# Patient Record
Sex: Male | Born: 1955
Health system: Southern US, Community
[De-identification: ages and names within clinical notes are randomized; demographics above are authoritative.]

## PROBLEM LIST (undated history)

## (undated) DIAGNOSIS — I7300 Raynaud's syndrome without gangrene: Secondary | ICD-10-CM

## (undated) DIAGNOSIS — E119 Type 2 diabetes mellitus without complications: Secondary | ICD-10-CM

## (undated) DIAGNOSIS — E785 Hyperlipidemia, unspecified: Secondary | ICD-10-CM

## (undated) HISTORY — DX: Hyperlipidemia, unspecified: E78.5

## (undated) HISTORY — DX: Raynaud's syndrome without gangrene: I73.00

## (undated) HISTORY — PX: COLONOSCOPY: SHX174

## (undated) HISTORY — DX: Type 2 diabetes mellitus without complications: E11.9

## (undated) HISTORY — DX: Gilbert syndrome: E80.4

## (undated) HISTORY — PX: TONSILLECTOMY AND ADENOIDECTOMY: SUR1326

## (undated) HISTORY — PX: SHOULDER SURGERY: SHX246

---

## 2001-06-16 ENCOUNTER — Encounter: Payer: Self-pay | Admitting: Internal Medicine

## 2001-06-16 ENCOUNTER — Ambulatory Visit (HOSPITAL_COMMUNITY): Admission: RE | Admit: 2001-06-16 | Discharge: 2001-06-16 | Payer: Self-pay | Admitting: Internal Medicine

## 2001-07-19 ENCOUNTER — Encounter: Admission: RE | Admit: 2001-07-19 | Discharge: 2001-10-17 | Payer: Self-pay | Admitting: Internal Medicine

## 2003-10-25 ENCOUNTER — Ambulatory Visit (HOSPITAL_COMMUNITY): Admission: RE | Admit: 2003-10-25 | Discharge: 2003-10-25 | Payer: Self-pay | Admitting: Gastroenterology

## 2004-08-10 ENCOUNTER — Ambulatory Visit: Payer: Self-pay | Admitting: Internal Medicine

## 2004-08-17 ENCOUNTER — Ambulatory Visit: Payer: Self-pay | Admitting: Internal Medicine

## 2004-11-09 ENCOUNTER — Ambulatory Visit: Payer: Self-pay | Admitting: Internal Medicine

## 2005-07-08 ENCOUNTER — Ambulatory Visit: Payer: Self-pay | Admitting: Internal Medicine

## 2005-10-07 ENCOUNTER — Ambulatory Visit: Payer: Self-pay | Admitting: Internal Medicine

## 2006-02-03 ENCOUNTER — Ambulatory Visit: Payer: Self-pay | Admitting: Internal Medicine

## 2006-06-28 HISTORY — DX: Gilbert syndrome: E80.4

## 2006-07-17 ENCOUNTER — Encounter (INDEPENDENT_AMBULATORY_CARE_PROVIDER_SITE_OTHER): Payer: Self-pay | Admitting: *Deleted

## 2006-07-17 LAB — CONVERTED CEMR LAB

## 2006-08-22 ENCOUNTER — Ambulatory Visit: Payer: Self-pay | Admitting: Internal Medicine

## 2006-08-22 LAB — CONVERTED CEMR LAB
ALT: 21 units/L (ref 0–40)
AST: 29 units/L (ref 0–37)
Albumin: 4.3 g/dL (ref 3.5–5.2)
Alkaline Phosphatase: 54 units/L (ref 39–117)
BUN: 17 mg/dL (ref 6–23)
Basophils Absolute: 0 10*3/uL (ref 0.0–0.1)
Basophils Relative: 1 % (ref 0.0–1.0)
Bilirubin, Direct: 0.2 mg/dL (ref 0.0–0.3)
CO2: 30 meq/L (ref 19–32)
Calcium: 9.7 mg/dL (ref 8.4–10.5)
Chloride: 104 meq/L (ref 96–112)
Cholesterol: 167 mg/dL (ref 0–200)
Creatinine, Ser: 1.1 mg/dL (ref 0.4–1.5)
Creatinine,U: 199.4 mg/dL
Eosinophils Absolute: 0 10*3/uL (ref 0.0–0.6)
Eosinophils Relative: 0.7 % (ref 0.0–5.0)
GFR calc Af Amer: 91 mL/min
GFR calc non Af Amer: 75 mL/min
Glucose, Bld: 141 mg/dL — ABNORMAL HIGH (ref 70–99)
HCT: 42.4 % (ref 39.0–52.0)
HDL: 53.7 mg/dL (ref >39.0)
Hemoglobin: 14.4 g/dL (ref 13.0–17.0)
Hgb A1c MFr Bld: 6.4 % — ABNORMAL HIGH (ref 4.6–6.0)
LDL Cholesterol: 96 mg/dL (ref 0–99)
Lymphocytes Relative: 37 % (ref 12.0–46.0)
MCHC: 34 g/dL (ref 30.0–36.0)
MCV: 94.5 fL (ref 78.0–100.0)
Microalb Creat Ratio: 3 mg/g (ref 0.0–30.0)
Microalb, Ur: 0.6 mg/dL (ref 0.0–1.9)
Monocytes Absolute: 0.3 10*3/uL (ref 0.2–0.7)
Monocytes Relative: 7.6 % (ref 3.0–11.0)
Neutro Abs: 2 10*3/uL (ref 1.4–7.7)
Neutrophils Relative %: 53.7 % (ref 43.0–77.0)
Platelets: 167 10*3/uL (ref 150–400)
Potassium: 4.5 meq/L (ref 3.5–5.1)
RBC: 4.49 M/uL (ref 4.22–5.81)
RDW: 12.1 % (ref 11.5–14.6)
Sodium: 142 meq/L (ref 135–145)
TSH: 1.38 microintl units/mL (ref 0.35–5.50)
Total Bilirubin: 1.4 mg/dL — ABNORMAL HIGH (ref 0.3–1.2)
Total CHOL/HDL Ratio: 3.1
Total Protein: 6.8 g/dL (ref 6.0–8.3)
Triglycerides: 88 mg/dL (ref 0–149)
VLDL: 18 mg/dL (ref 0–40)
WBC: 3.6 10*3/uL — ABNORMAL LOW (ref 4.5–10.5)

## 2007-02-16 ENCOUNTER — Ambulatory Visit: Payer: Self-pay | Admitting: Internal Medicine

## 2007-02-25 LAB — CONVERTED CEMR LAB
Cholesterol: 125 mg/dL
HDL: 47.3 mg/dL
Hgb A1c MFr Bld: 5.9 %
LDL Cholesterol: 69 mg/dL
Total CHOL/HDL Ratio: 2.6
Triglycerides: 46 mg/dL
VLDL: 9 mg/dL

## 2007-02-28 ENCOUNTER — Encounter (INDEPENDENT_AMBULATORY_CARE_PROVIDER_SITE_OTHER): Payer: Self-pay | Admitting: *Deleted

## 2007-05-08 ENCOUNTER — Telehealth (INDEPENDENT_AMBULATORY_CARE_PROVIDER_SITE_OTHER): Payer: Self-pay | Admitting: *Deleted

## 2007-05-11 DIAGNOSIS — Z9089 Acquired absence of other organs: Secondary | ICD-10-CM

## 2007-05-11 DIAGNOSIS — M109 Gout, unspecified: Secondary | ICD-10-CM | POA: Insufficient documentation

## 2007-05-11 DIAGNOSIS — I7300 Raynaud's syndrome without gangrene: Secondary | ICD-10-CM | POA: Insufficient documentation

## 2007-06-09 ENCOUNTER — Telehealth (INDEPENDENT_AMBULATORY_CARE_PROVIDER_SITE_OTHER): Payer: Self-pay | Admitting: *Deleted

## 2007-06-20 ENCOUNTER — Encounter (INDEPENDENT_AMBULATORY_CARE_PROVIDER_SITE_OTHER): Payer: Self-pay | Admitting: *Deleted

## 2007-08-21 ENCOUNTER — Ambulatory Visit: Payer: Self-pay | Admitting: Internal Medicine

## 2007-08-29 ENCOUNTER — Encounter (INDEPENDENT_AMBULATORY_CARE_PROVIDER_SITE_OTHER): Payer: Self-pay | Admitting: *Deleted

## 2007-08-29 LAB — CONVERTED CEMR LAB
ALT: 16 units/L (ref 0–53)
AST: 20 units/L (ref 0–37)
Albumin: 3.7 g/dL (ref 3.5–5.2)
Alkaline Phosphatase: 55 units/L (ref 39–117)
BUN: 16 mg/dL (ref 6–23)
Basophils Absolute: 0 10*3/uL (ref 0.0–0.1)
Basophils Relative: 0.3 % (ref 0.0–1.0)
Bilirubin, Direct: 0.1 mg/dL (ref 0.0–0.3)
CO2: 31 meq/L (ref 19–32)
Calcium: 9.2 mg/dL (ref 8.4–10.5)
Chloride: 104 meq/L (ref 96–112)
Cholesterol: 112 mg/dL (ref 0–200)
Creatinine, Ser: 1.5 mg/dL (ref 0.4–1.5)
Eosinophils Absolute: 0 10*3/uL (ref 0.0–0.6)
Eosinophils Relative: 0.8 % (ref 0.0–5.0)
GFR calc Af Amer: 63 mL/min
GFR calc non Af Amer: 52 mL/min
Glucose, Bld: 120 mg/dL — ABNORMAL HIGH (ref 70–99)
HCT: 40.5 % (ref 39.0–52.0)
HDL: 38.6 mg/dL — ABNORMAL LOW (ref >39.0)
Hemoglobin: 13.5 g/dL (ref 13.0–17.0)
Hgb A1c MFr Bld: 5.9 % (ref 4.6–6.0)
LDL Cholesterol: 65 mg/dL (ref 0–99)
Lymphocytes Relative: 31.6 % (ref 12.0–46.0)
MCHC: 33.4 g/dL (ref 30.0–36.0)
MCV: 93.9 fL (ref 78.0–100.0)
Monocytes Absolute: 0.3 10*3/uL (ref 0.2–0.7)
Monocytes Relative: 8.1 % (ref 3.0–11.0)
Neutro Abs: 2.2 10*3/uL (ref 1.4–7.7)
Neutrophils Relative %: 59.2 % (ref 43.0–77.0)
Platelets: 141 10*3/uL — ABNORMAL LOW (ref 150–400)
Potassium: 5.2 meq/L — ABNORMAL HIGH (ref 3.5–5.1)
RBC: 4.31 M/uL (ref 4.22–5.81)
RDW: 11.7 % (ref 11.5–14.6)
Sodium: 140 meq/L (ref 135–145)
TSH: 1.02 microintl units/mL (ref 0.35–5.50)
Total Bilirubin: 0.6 mg/dL (ref 0.3–1.2)
Total CHOL/HDL Ratio: 2.9
Total Protein: 6 g/dL (ref 6.0–8.3)
Triglycerides: 40 mg/dL (ref 0–149)
VLDL: 8 mg/dL (ref 0–40)
WBC: 3.7 10*3/uL — ABNORMAL LOW (ref 4.5–10.5)

## 2007-09-08 ENCOUNTER — Telehealth (INDEPENDENT_AMBULATORY_CARE_PROVIDER_SITE_OTHER): Payer: Self-pay | Admitting: *Deleted

## 2007-09-21 ENCOUNTER — Ambulatory Visit: Payer: Self-pay | Admitting: Internal Medicine

## 2007-09-21 DIAGNOSIS — E782 Mixed hyperlipidemia: Secondary | ICD-10-CM

## 2007-09-21 DIAGNOSIS — E7849 Other hyperlipidemia: Secondary | ICD-10-CM | POA: Insufficient documentation

## 2007-09-21 DIAGNOSIS — E1165 Type 2 diabetes mellitus with hyperglycemia: Secondary | ICD-10-CM

## 2007-09-21 HISTORY — DX: Other hyperlipidemia: E78.49

## 2008-03-21 ENCOUNTER — Ambulatory Visit: Payer: Self-pay | Admitting: Internal Medicine

## 2008-03-24 LAB — CONVERTED CEMR LAB
Creatinine,U: 164.4 mg/dL
Hgb A1c MFr Bld: 6 % (ref 4.6–6.0)
Microalb Creat Ratio: 29.8 mg/g (ref 0.0–30.0)
Microalb, Ur: 4.9 mg/dL — ABNORMAL HIGH (ref 0.0–1.9)

## 2008-03-25 ENCOUNTER — Encounter (INDEPENDENT_AMBULATORY_CARE_PROVIDER_SITE_OTHER): Payer: Self-pay | Admitting: *Deleted

## 2008-03-28 ENCOUNTER — Ambulatory Visit: Payer: Self-pay | Admitting: Internal Medicine

## 2008-09-10 ENCOUNTER — Telehealth (INDEPENDENT_AMBULATORY_CARE_PROVIDER_SITE_OTHER): Payer: Self-pay | Admitting: *Deleted

## 2008-09-11 ENCOUNTER — Ambulatory Visit: Payer: Self-pay | Admitting: Internal Medicine

## 2008-09-23 ENCOUNTER — Encounter (INDEPENDENT_AMBULATORY_CARE_PROVIDER_SITE_OTHER): Payer: Self-pay | Admitting: *Deleted

## 2008-09-23 LAB — CONVERTED CEMR LAB
ALT: 17 units/L (ref 0–53)
AST: 25 units/L (ref 0–37)
Albumin: 4.1 g/dL (ref 3.5–5.2)
Alkaline Phosphatase: 47 units/L (ref 39–117)
BUN: 19 mg/dL (ref 6–23)
Basophils Absolute: 0 10*3/uL (ref 0.0–0.1)
Basophils Relative: 0.7 % (ref 0.0–3.0)
Bilirubin, Direct: 0.1 mg/dL (ref 0.0–0.3)
CO2: 29 meq/L (ref 19–32)
Calcium: 9.3 mg/dL (ref 8.4–10.5)
Chloride: 105 meq/L (ref 96–112)
Cholesterol: 139 mg/dL (ref 0–200)
Creatinine, Ser: 1.2 mg/dL (ref 0.4–1.5)
Eosinophils Absolute: 0.1 10*3/uL (ref 0.0–0.7)
Eosinophils Relative: 1.7 % (ref 0.0–5.0)
GFR calc non Af Amer: 67.46 mL/min (ref >60)
Glucose, Bld: 134 mg/dL — ABNORMAL HIGH (ref 70–99)
HCT: 39.5 % (ref 39.0–52.0)
HDL: 50.5 mg/dL (ref >39.00)
Hemoglobin: 13.6 g/dL (ref 13.0–17.0)
LDL Cholesterol: 76 mg/dL (ref 0–99)
Lymphocytes Relative: 28.1 % (ref 12.0–46.0)
Lymphs Abs: 1.3 10*3/uL (ref 0.7–4.0)
MCHC: 34.5 g/dL (ref 30.0–36.0)
MCV: 95.8 fL (ref 78.0–100.0)
Monocytes Absolute: 0.3 10*3/uL (ref 0.1–1.0)
Monocytes Relative: 6.4 % (ref 3.0–12.0)
Neutro Abs: 3 10*3/uL (ref 1.4–7.7)
Neutrophils Relative %: 63.1 % (ref 43.0–77.0)
PSA: 0.73 ng/mL (ref 0.10–4.00)
Platelets: 166 10*3/uL (ref 150.0–400.0)
Potassium: 4.5 meq/L (ref 3.5–5.1)
RBC: 4.12 M/uL — ABNORMAL LOW (ref 4.22–5.81)
RDW: 12.4 % (ref 11.5–14.6)
Sodium: 140 meq/L (ref 135–145)
TSH: 1.5 microintl units/mL (ref 0.35–5.50)
Total Bilirubin: 0.9 mg/dL (ref 0.3–1.2)
Total CHOL/HDL Ratio: 3
Total Protein: 6.4 g/dL (ref 6.0–8.3)
Triglycerides: 64 mg/dL (ref 0.0–149.0)
VLDL: 12.8 mg/dL (ref 0.0–40.0)
WBC: 4.7 10*3/uL (ref 4.5–10.5)

## 2008-10-10 ENCOUNTER — Ambulatory Visit: Payer: Self-pay | Admitting: Internal Medicine

## 2008-10-11 ENCOUNTER — Encounter (INDEPENDENT_AMBULATORY_CARE_PROVIDER_SITE_OTHER): Payer: Self-pay | Admitting: *Deleted

## 2008-11-07 ENCOUNTER — Ambulatory Visit: Payer: Self-pay | Admitting: Internal Medicine

## 2008-11-07 LAB — CONVERTED CEMR LAB: Rapid Strep: NEGATIVE

## 2009-02-11 ENCOUNTER — Telehealth (INDEPENDENT_AMBULATORY_CARE_PROVIDER_SITE_OTHER): Payer: Self-pay | Admitting: *Deleted

## 2009-02-11 ENCOUNTER — Ambulatory Visit: Payer: Self-pay | Admitting: Internal Medicine

## 2009-02-16 LAB — CONVERTED CEMR LAB: Hgb A1c MFr Bld: 6.1 % (ref 4.6–6.5)

## 2009-02-17 ENCOUNTER — Encounter (INDEPENDENT_AMBULATORY_CARE_PROVIDER_SITE_OTHER): Payer: Self-pay | Admitting: *Deleted

## 2009-06-17 ENCOUNTER — Ambulatory Visit: Payer: Self-pay | Admitting: Internal Medicine

## 2009-08-28 ENCOUNTER — Telehealth (INDEPENDENT_AMBULATORY_CARE_PROVIDER_SITE_OTHER): Payer: Self-pay | Admitting: *Deleted

## 2009-11-28 ENCOUNTER — Telehealth (INDEPENDENT_AMBULATORY_CARE_PROVIDER_SITE_OTHER): Payer: Self-pay | Admitting: *Deleted

## 2009-12-04 ENCOUNTER — Encounter: Payer: Self-pay | Admitting: Internal Medicine

## 2010-02-02 ENCOUNTER — Telehealth (INDEPENDENT_AMBULATORY_CARE_PROVIDER_SITE_OTHER): Payer: Self-pay | Admitting: *Deleted

## 2010-02-19 ENCOUNTER — Encounter: Payer: Self-pay | Admitting: Internal Medicine

## 2010-03-05 ENCOUNTER — Ambulatory Visit: Payer: Self-pay | Admitting: Internal Medicine

## 2010-03-05 DIAGNOSIS — N400 Enlarged prostate without lower urinary tract symptoms: Secondary | ICD-10-CM

## 2010-06-08 ENCOUNTER — Ambulatory Visit: Payer: Self-pay | Admitting: Internal Medicine

## 2010-06-12 LAB — CONVERTED CEMR LAB: Hgb A1c MFr Bld: 6.3 % (ref 4.6–6.5)

## 2010-07-26 LAB — CONVERTED CEMR LAB
ALT: 15 units/L (ref 0–53)
AST: 21 units/L (ref 0–37)
Albumin: 4.6 g/dL (ref 3.5–5.2)
Alkaline Phosphatase: 52 units/L (ref 39–117)
BUN: 19 mg/dL (ref 6–23)
Basophils Absolute: 0 10*3/uL (ref 0.0–0.1)
Basophils Relative: 0.4 % (ref 0.0–3.0)
Bilirubin Urine: NEGATIVE
Bilirubin, Direct: 0.2 mg/dL (ref 0.0–0.3)
Blood in Urine, dipstick: NEGATIVE
CO2: 29 meq/L (ref 19–32)
Calcium: 9.9 mg/dL (ref 8.4–10.5)
Chloride: 101 meq/L (ref 96–112)
Cholesterol, target level: 200 mg/dL
Cholesterol: 187 mg/dL (ref 0–200)
Creatinine, Ser: 1.1 mg/dL (ref 0.4–1.5)
Creatinine,U: 239.8 mg/dL
Creatinine,U: 70.8 mg/dL
Eosinophils Absolute: 0.1 10*3/uL (ref 0.0–0.7)
Eosinophils Relative: 1.4 % (ref 0.0–5.0)
GFR calc non Af Amer: 72.64 mL/min (ref >60)
Glucose, Bld: 139 mg/dL — ABNORMAL HIGH (ref 70–99)
Glucose, Urine, Semiquant: NEGATIVE
HCT: 42.7 % (ref 39.0–52.0)
HDL goal, serum: 40 mg/dL
HDL: 56.5 mg/dL (ref >39.00)
Hemoglobin: 14.7 g/dL (ref 13.0–17.0)
Hgb A1c MFr Bld: 5.6 % (ref 4.6–6.5)
Hgb A1c MFr Bld: 6.4 % (ref 4.6–6.5)
Ketones, urine, test strip: NEGATIVE
LDL Cholesterol: 111 mg/dL — ABNORMAL HIGH (ref 0–99)
LDL Goal: 100 mg/dL
Lymphocytes Relative: 26.9 % (ref 12.0–46.0)
Lymphs Abs: 1.1 10*3/uL (ref 0.7–4.0)
MCHC: 34.4 g/dL (ref 30.0–36.0)
MCV: 95.8 fL (ref 78.0–100.0)
Microalb Creat Ratio: 0.9 mg/g (ref 0.0–30.0)
Microalb Creat Ratio: 2.8 mg/g (ref 0.0–30.0)
Microalb, Ur: 0.2 mg/dL (ref 0.0–1.9)
Microalb, Ur: 2.2 mg/dL — ABNORMAL HIGH (ref 0.0–1.9)
Monocytes Absolute: 0.3 10*3/uL (ref 0.1–1.0)
Monocytes Relative: 6.2 % (ref 3.0–12.0)
Neutro Abs: 2.8 10*3/uL (ref 1.4–7.7)
Neutrophils Relative %: 65.1 % (ref 43.0–77.0)
Nitrite: NEGATIVE
Platelets: 161 10*3/uL (ref 150.0–400.0)
Potassium: 5.1 meq/L (ref 3.5–5.1)
Protein, U semiquant: NEGATIVE
RBC: 4.46 M/uL (ref 4.22–5.81)
RDW: 12.8 % (ref 11.5–14.6)
Sodium: 140 meq/L (ref 135–145)
Specific Gravity, Urine: <1.005
TSH: 1.63 microintl units/mL (ref 0.35–5.50)
Total Bilirubin: 1 mg/dL (ref 0.3–1.2)
Total CHOL/HDL Ratio: 3
Total Protein: 6.8 g/dL (ref 6.0–8.3)
Triglycerides: 100 mg/dL (ref 0.0–149.0)
Uric Acid, Serum: 7.1 mg/dL (ref 4.0–7.8)
Urobilinogen, UA: 0.2
VLDL: 20 mg/dL (ref 0.0–40.0)
WBC Urine, dipstick: NEGATIVE
WBC: 4.3 10*3/uL — ABNORMAL LOW (ref 4.5–10.5)
pH: 6.5
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
## 2010-07-30 NOTE — Letter (Signed)
Summary: [facility]   Imported By: Lanelle Bal 02/26/2010 11:01:06  _____________________________________________________________________  External Attachment:    Type:   Image     Comment:   External Document

## 2010-07-30 NOTE — Progress Notes (Signed)
Summary: vytorin  Phone Note Refill Request Message from:  Pharmacy [facility] [phone]  Refills Requested: Medication #1:  VYTORIN 10-20 MG TABS take one tablet at bedtime Initial call taken by: Kandice Hams,  August 28, 2009 4:33 PM    Prescriptions: VYTORIN 10-20 MG TABS (EZETIMIBE-SIMVASTATIN) take one tablet at bedtime  #90 x 0   Entered by:   Kandice Hams   Authorized by:   Marga Melnick MD   Signed by:   Kandice Hams on 08/28/2009   Method used:   Faxed to ...       [facility]* (mail-order)             ,          Ph: [phone]       Fax: [phone]   RxID:   [id number]

## 2010-07-30 NOTE — Letter (Signed)
Summary: [facility]   Imported By: Lanelle Bal 12/18/2009 13:07:51  _____________________________________________________________________  External Attachment:    Type:   Image     Comment:   External Document

## 2010-07-30 NOTE — Progress Notes (Signed)
Summary: REFILL REQUEST  Phone Note Refill Request Message from:  Patient on February 02, 2010 8:36 AM  Refills Requested: Medication #1:  GLIMEPIRIDE 1 MG  TABS 1/2 once daily   Dosage confirmed as above?Dosage Confirmed   Supply Requested: 1 month   Last Refilled: 01/01/2010 PT STATES HE WAS TAKING 1 TABLET ONCE A DAY AND WHEN THE RX WAS REFILLED ON 01/01/10 IT INSTRUCTRED HIM TO TAKE ONLY 1/2 A TABLET EACH DAY. . PT CAN BE CONTACTED AT [phone]  RX NEEDS TO BE SENT TO [facility] ON [address]  Next Appointment Scheduled: SEPT 8TH 2011 Initial call taken by: Lavell Islam,  February 02, 2010 8:42 AM    New/Updated Medications: GLIMEPIRIDE 1 MG  TABS (GLIMEPIRIDE) 1 by mouth daily, Due for labs. A1c 250.00 Prescriptions: GLIMEPIRIDE 1 MG  TABS (GLIMEPIRIDE) 1 by mouth daily, Due for labs. A1c 250.00  #30 x 1   Entered by:   Shonna Chock CMA   Authorized by:   Marga Melnick MD   Signed by:   Shonna Chock CMA on 02/02/2010   Method used:   Electronically to        [facility] [address] (retail)       [address]       [place], [place]  [address]       Ph: [phone]       Fax: [phone]   RxID:   [id number]

## 2010-07-30 NOTE — Progress Notes (Signed)
Summary: Refill Request  Phone Note Refill Request Call back at [phone] Message from:  Pharmacy on November 28, 2009 8:57 AM  Refills Requested: Medication #1:  VYTORIN 10-20 MG TABS take one tablet at bedtime   Dosage confirmed as above?Dosage Confirmed   Supply Requested: 3 months [facility]  Next Appointment Scheduled: 9.8.11 Initial call taken by: Harold Barban,  November 28, 2009 8:58 AM    Prescriptions: VYTORIN 10-20 MG TABS (EZETIMIBE-SIMVASTATIN) take one tablet at bedtime  #90 x 0   Entered by:   Shonna Chock   Authorized by:   Marga Melnick MD   Signed by:   Shonna Chock on 11/28/2009   Method used:   Faxed to ...       [facility]* (mail-order)             ,          Ph: [phone]       Fax: [phone]   RxID:   [id number]

## 2010-11-13 NOTE — Op Note (Signed)
NAME:  Luke West, Luke West                          ACCOUNT NO.:  [id number]   MEDICAL RECORD NO.:  [id number]                   PATIENT TYPE:  AMB   LOCATION:  ENDO                                 FACILITY:  [facility]   PHYSICIAN:  Petra Kuba, M.D.                 DATE OF BIRTH:  [birth year]   DATE OF PROCEDURE:  10/25/2003  DATE OF DISCHARGE:                                 OPERATIVE REPORT   PROCEDURE PERFORMED:  Colonoscopy with polypectomy.   ENDOSCOPIST:  Petra Kuba, M.D.   INDICATIONS FOR PROCEDURE:  Patient with guaiac positivity.  Almost due for  colonic screening.  Consent was signed after the risks, benefits, methods  and options were thoroughly discussed in the office.   MEDICINES USED:  Demerol 80 mg, Versed 8 mg.   DESCRIPTION OF PROCEDURE:  Rectal inspection was essentially normal.  Digital exam was negative.  A video pediatric adjustable colonoscope was  inserted and easily advanced around the colon to the cecum.  It did not  require any abdominal pressure or any position changes.  No abnormality was  seen on insertion.  The cecum was identified by the appendiceal orifice and  the ileocecal valve.  In fact the scope was inserted a short ways into the  terminal ileum which was normal.  Photodocumentation was obtained.  The  scope was slowly withdrawn.  There was some stool adherent to the wall of  the cecum which could not be washed off.  The rest of the prep was adequate.  On slow withdrawal through the colon, no abnormalities were seen.  Some  washing and suctioning was done.  Specifically, no polyps, tumors, masses or  diverticula.  Once back in the rectum, anorectal pullthrough and  retroflexion confirmed some small internal hemorrhoids.  Scope was  reinserted a short ways up the left side of the colon, air was suctioned,  scope removed.  The patient tolerated the procedure well.  There was no  immediate obvious complication.   ENDOSCOPIC DIAGNOSIS:  1. Small  internal greater than external hemorrhoids.  2. Otherwise within normal limits to the terminal ileum.   PLAN:  Follow-up as needed or in two months to recheck guaiacs off of  aspirin and make sure no further work-up plans are needed.  Possibly even  recheck CBC just to be sure at that juncture and then repeat screening in  five years.                                               Petra Kuba, M.D.    MEM/MEDQ  D:  10/25/2003  T:  10/25/2003  Job:  [id number]   cc:   Titus Dubin. Alwyn Ren, M.D. [facility]

## 2010-12-28 ENCOUNTER — Other Ambulatory Visit: Payer: Self-pay | Admitting: Internal Medicine

## 2010-12-28 NOTE — Telephone Encounter (Signed)
A1c 250.00 

## 2011-03-18 ENCOUNTER — Other Ambulatory Visit: Payer: Self-pay | Admitting: Internal Medicine

## 2011-03-18 NOTE — Telephone Encounter (Signed)
Lipid/Hep 272.4/995.20  

## 2011-03-22 ENCOUNTER — Other Ambulatory Visit: Payer: Self-pay | Admitting: Internal Medicine

## 2011-03-22 NOTE — Telephone Encounter (Signed)
A1c 250.00 

## 2011-04-13 ENCOUNTER — Other Ambulatory Visit: Payer: Self-pay | Admitting: Internal Medicine

## 2011-04-13 MED ORDER — SITAGLIPTIN PHOS-METFORMIN HCL 50-500 MG PO TABS: 1.0000 | ORAL_TABLET | Freq: Two times a day (BID) | ORAL | Status: DC

## 2011-04-13 MED ORDER — EZETIMIBE-SIMVASTATIN 10-20 MG PO TABS: 1.0000 | ORAL_TABLET | Freq: Every day | ORAL | Status: DC

## 2011-04-13 NOTE — Telephone Encounter (Signed)
Pt needs refill on Janumet 50-500mg  and Vytorin 10-20mg .  His physical is schedule for jan 2013.

## 2011-04-13 NOTE — Telephone Encounter (Signed)
RX sent

## 2011-04-16 ENCOUNTER — Other Ambulatory Visit: Payer: Self-pay | Admitting: Internal Medicine

## 2011-04-16 NOTE — Telephone Encounter (Signed)
Pt need Janumet 50-500mg  to be changed to [facility].  [facility], but [facility] states it has to be a mail order rx.  Pt also states he wants the vytorin changed to [facility] also for [facility] RxBin- [id number], [facility] Member id - [id number]  Mailing address -  [address] [phone]

## 2011-04-19 MED ORDER — EZETIMIBE-SIMVASTATIN 10-20 MG PO TABS: 1.0000 | ORAL_TABLET | Freq: Every day | ORAL | Status: DC

## 2011-04-19 MED ORDER — SITAGLIPTIN PHOS-METFORMIN HCL 50-500 MG PO TABS: 1.0000 | ORAL_TABLET | Freq: Two times a day (BID) | ORAL | Status: DC

## 2011-04-19 NOTE — Telephone Encounter (Signed)
Both rx's sent

## 2011-06-30 ENCOUNTER — Encounter: Payer: Self-pay | Admitting: Internal Medicine

## 2011-07-21 ENCOUNTER — Ambulatory Visit (INDEPENDENT_AMBULATORY_CARE_PROVIDER_SITE_OTHER): Payer: 59 | Admitting: Internal Medicine

## 2011-07-21 ENCOUNTER — Encounter: Payer: Self-pay | Admitting: Internal Medicine

## 2011-07-21 VITALS — BP 104/76 | HR 58 | Temp 98.1°F | Resp 12 | Ht 70.75 in | Wt 198.2 lb

## 2011-07-21 DIAGNOSIS — Z0000 Encounter for general adult medical examination without abnormal findings: Secondary | ICD-10-CM

## 2011-07-21 DIAGNOSIS — E119 Type 2 diabetes mellitus without complications: Secondary | ICD-10-CM

## 2011-07-21 NOTE — Progress Notes (Signed)
Addended by: Maurice Small on: 07/21/2011 03:45 PM   Modules accepted: Orders
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
## 2011-07-21 NOTE — Patient Instructions (Signed)
Preventive Health Care: Eat a low-fat diet with lots of fruits and vegetables, up to 7-9 servings per day. Consume less than 40 grams of sugar per day from foods & drinks with High Fructose Corn Sugar as # 1,2,3 or # 4 on label. Please  schedule fasting Labs : BMET,Lipids, hepatic panel, CBC & dif, TSH, A1c, urine microalbumin, uric acid. PLEASE BRING THESE INSTRUCTIONS TO FOLLOW UP  LAB APPOINTMENT.This will guarantee correct labs are drawn, eliminating need for repeat blood sampling ( needle sticks ! ). Diagnoses /Codes: V70.0

## 2011-07-21 NOTE — Assessment & Plan Note (Signed)
A1 C range has been from low of 5.6% in April 2010 to a high of 6.4% in September of 2011.

## 2011-07-26 ENCOUNTER — Other Ambulatory Visit (INDEPENDENT_AMBULATORY_CARE_PROVIDER_SITE_OTHER): Payer: 59

## 2011-07-26 DIAGNOSIS — E119 Type 2 diabetes mellitus without complications: Secondary | ICD-10-CM

## 2011-07-26 DIAGNOSIS — Z0000 Encounter for general adult medical examination without abnormal findings: Secondary | ICD-10-CM

## 2011-07-26 LAB — BASIC METABOLIC PANEL
BUN: 25 mg/dL — ABNORMAL HIGH (ref 6–23)
CO2: 28 mEq/L (ref 19–32)
Chloride: 103 mEq/L (ref 96–112)
Creatinine, Ser: 1 mg/dL (ref 0.4–1.5)
Potassium: 5.4 mEq/L — ABNORMAL HIGH (ref 3.5–5.1)

## 2011-07-26 LAB — CBC WITH DIFFERENTIAL/PLATELET
Eosinophils Relative: 1.4 % (ref 0.0–5.0)
Lymphocytes Relative: 25 % (ref 12.0–46.0)
Monocytes Absolute: 0.4 10*3/uL (ref 0.1–1.0)
Monocytes Relative: 7.5 % (ref 3.0–12.0)
Neutrophils Relative %: 65.5 % (ref 43.0–77.0)
Platelets: 165 10*3/uL (ref 150.0–400.0)
WBC: 5.4 10*3/uL (ref 4.5–10.5)

## 2011-07-26 LAB — MICROALBUMIN / CREATININE URINE RATIO
Creatinine,U: 170.1 mg/dL
Microalb, Ur: 1.8 mg/dL (ref 0.0–1.9)

## 2011-07-26 LAB — TSH: TSH: 1.91 u[IU]/mL (ref 0.35–5.50)

## 2011-07-26 LAB — LIPID PANEL
LDL Cholesterol: 113 mg/dL — ABNORMAL HIGH (ref 0–99)
Total CHOL/HDL Ratio: 3
VLDL: 10.2 mg/dL (ref 0.0–40.0)

## 2011-07-26 LAB — HEPATIC FUNCTION PANEL
ALT: 17 U/L (ref 0–53)
AST: 19 U/L (ref 0–37)
Albumin: 4.5 g/dL (ref 3.5–5.2)
Total Protein: 7.1 g/dL (ref 6.0–8.3)

## 2011-07-26 LAB — HEMOGLOBIN A1C: Hgb A1c MFr Bld: 7.2 % — ABNORMAL HIGH (ref 4.6–6.5)

## 2011-08-29 ENCOUNTER — Other Ambulatory Visit: Payer: Self-pay | Admitting: Internal Medicine

## 2011-08-30 NOTE — Telephone Encounter (Signed)
Prescription sent to pharmacy.

## 2011-10-26 ENCOUNTER — Telehealth: Payer: Self-pay

## 2011-10-26 DIAGNOSIS — E785 Hyperlipidemia, unspecified: Secondary | ICD-10-CM

## 2011-10-26 NOTE — Telephone Encounter (Signed)
Orders placed.

## 2011-10-26 NOTE — Telephone Encounter (Signed)
Call from patient and he stated he was due for labs. Previously done 07/21/11 . He wants to go to [place] and have labs done in the morning. Please put in the future order in the system.      KP

## 2011-10-27 ENCOUNTER — Other Ambulatory Visit (INDEPENDENT_AMBULATORY_CARE_PROVIDER_SITE_OTHER): Payer: 59

## 2011-10-27 DIAGNOSIS — E785 Hyperlipidemia, unspecified: Secondary | ICD-10-CM

## 2011-10-27 DIAGNOSIS — IMO0001 Reserved for inherently not codable concepts without codable children: Secondary | ICD-10-CM

## 2011-10-27 LAB — LIPID PANEL
Cholesterol: 155 mg/dL (ref 0–200)
HDL: 52.4 mg/dL (ref >39.00)
LDL Cholesterol: 79 mg/dL (ref 0–99)
Triglycerides: 119 mg/dL (ref 0.0–149.0)

## 2011-10-27 LAB — MICROALBUMIN / CREATININE URINE RATIO: Microalb, Ur: 3.4 mg/dL — ABNORMAL HIGH (ref 0.0–1.9)

## 2011-11-30 ENCOUNTER — Encounter: Payer: Self-pay | Admitting: Internal Medicine

## 2011-11-30 ENCOUNTER — Ambulatory Visit (INDEPENDENT_AMBULATORY_CARE_PROVIDER_SITE_OTHER): Payer: 59 | Admitting: Internal Medicine

## 2011-11-30 VITALS — BP 122/86 | HR 58 | Wt 194.0 lb

## 2011-11-30 DIAGNOSIS — E785 Hyperlipidemia, unspecified: Secondary | ICD-10-CM

## 2011-11-30 DIAGNOSIS — E119 Type 2 diabetes mellitus without complications: Secondary | ICD-10-CM

## 2011-11-30 MED ORDER — SITAGLIPTIN PHOS-METFORMIN HCL 50-1000 MG PO TABS: 1.0000 | ORAL_TABLET | Freq: Two times a day (BID) | ORAL | Status: DC

## 2011-11-30 NOTE — Assessment & Plan Note (Signed)
Lipids are at goal; LDL has dropped from 113-79. HDL is protective at 52.4. No change in Vytorin  10/20 indicated

## 2011-11-30 NOTE — Progress Notes (Signed)
Subjective:    Patient ID: Luke West, male    DOB: 1955-11-23, 56 y.o.   MRN: [id number]  HPI Diabetes status assessment: Fasting or morning glucose  average : 160 Highest glucose 2 hours after any meal:  < 180. Marland Kitchen                                                                    Exercise : CVE 5-7X/ week 30-60 min . Nutrition/diet: Less strict diet and increased alcohol consumption  . Medication compliance :yes. Medication adverse  Effects:  no . Eye exam : 6 mos ago. Foot care : no.    Review of Systems Hypoglycemia : no .                                                     Excess thirst;   hunger:   ; or  urination:  No but nocturia X 2.                                  Lightheadedness with standing:  rarely. Chest pain:  no ; Palpitations :no ;  Pain in  calves with walking:  no .                                                                                                                                 Non healing skin  ulcers or sores,especially over the feet: no. Numbness or tingling or burning in feet : only after 20-30 min of CVE .                                                                                                                                              Significant change in  Weight : no. Vision changes :no       Objective:   Physical  Exam Gen.: Healthy and well-nourished in appearance. Alert, appropriate and cooperative throughout exam.  Eyes: No corneal or conjunctival inflammation noted.  Neck: No deformities, masses, or tenderness noted.  Thyroid normal. Lungs: Normal respiratory effort; chest expands symmetrically. Lungs are clear to auscultation without rales, wheezes, or increased work of breathing. Heart: Normal rate and rhythm. Normal S1 and S2. No gallop, click, or rub. S4 w/o murmur. Abdomen: Bowel sounds normal; abdomen soft and nontender. No masses, organomegaly or hernias noted.                  Musculoskeletal/extremities:  No clubbing, cyanosis, edema, or deformity noted.Nail health  good. Vascular: Carotid, radial artery, dorsalis pedis and  posterior tibial pulses are full and equal. No bruits present. Neurologic: Alert and oriented x3.    Skin: Intact without suspicious lesions or rashes. Lymph: No cervical, axillary lymphadenopathy present. Psych: Mood and affect are normal. Normally interactive                                                                                         Assessment & Plan:

## 2011-11-30 NOTE — Patient Instructions (Signed)
PLEASE BRING THESE INSTRUCTIONS TO FOLLOW UP  LAB APPOINTMENT in 3 mos for A1c & microalbumin.This will guarantee correct labs are drawn, eliminating need for repeat blood sampling ( needle sticks ! ). Diagnoses /Codes: 250.02

## 2011-11-30 NOTE — Assessment & Plan Note (Addendum)
A1c has increased from 7.2%-7.5% with an expected increase of his urine microalbumin from 1.8-3.4. Options including insulin pump or increasing Janumet  were discussed. He opts for the latter with followup A1c in 3 months. He also will make lifestyle/nutritional changes during the period

## 2011-12-01 ENCOUNTER — Encounter: Payer: Self-pay | Admitting: Internal Medicine

## 2011-12-01 ENCOUNTER — Telehealth: Payer: Self-pay | Admitting: Internal Medicine

## 2011-12-01 ENCOUNTER — Ambulatory Visit (INDEPENDENT_AMBULATORY_CARE_PROVIDER_SITE_OTHER): Payer: 59 | Admitting: Internal Medicine

## 2011-12-01 VITALS — BP 118/62 | HR 60 | Temp 97.7°F

## 2011-12-01 DIAGNOSIS — Z87898 Personal history of other specified conditions: Secondary | ICD-10-CM

## 2011-12-01 DIAGNOSIS — I951 Orthostatic hypotension: Secondary | ICD-10-CM

## 2011-12-01 DIAGNOSIS — Z8709 Personal history of other diseases of the respiratory system: Secondary | ICD-10-CM

## 2011-12-01 DIAGNOSIS — R233 Spontaneous ecchymoses: Secondary | ICD-10-CM

## 2011-12-01 DIAGNOSIS — K644 Residual hemorrhoidal skin tags: Secondary | ICD-10-CM

## 2011-12-01 LAB — CBC WITH DIFFERENTIAL/PLATELET
Basophils Absolute: 0 10*3/uL (ref 0.0–0.1)
Eosinophils Absolute: 0.1 10*3/uL (ref 0.0–0.7)
Hemoglobin: 13.6 g/dL (ref 13.0–17.0)
Lymphocytes Relative: 24.8 % (ref 12.0–46.0)
Lymphs Abs: 1.1 10*3/uL (ref 0.7–4.0)
MCHC: 33 g/dL (ref 30.0–36.0)
Neutro Abs: 3 10*3/uL (ref 1.4–7.7)
Platelets: 157 10*3/uL (ref 150.0–400.0)
RDW: 12.9 % (ref 11.5–14.6)

## 2011-12-01 LAB — APTT: aPTT: 27 s (ref 21.7–28.8)

## 2011-12-01 NOTE — Progress Notes (Signed)
Subjective:    Patient ID: Luke West, male    DOB: [birth year], 56 y.o.   MRN: [id number]  HPI 2 months ago roughly he experienced acute pain in the right medial elbow/forearm area while practicing golf range. He attributed this to tendinitis. Over the next 2-3 days he had residual soreness as well as bruising which moved distally.  One week ago he had acute pain in the right groin area while playing golf. Last night he noted bruising along the right posterior thigh. A golf partner had noted some bruising behind the right knee late last week.      Review of Systems He's had some minor epistaxis and nasal scabbing. He denies hemoptysis, hematuria, rectal bleeding, or melena. He's always had a tendency to bruising. He is on 81 mg of aspirin a day. There is no family history of bleeding or clotting dyscrasias. He has numbness and lightheadedness when he stands up from having teed up his ball. He denies any blurred vision or blacking out of vision. 2 weeks ago he noted a "bulge" in his rectum which failed to respond Preparation H. A prescription agent called in by his gastroenterologist has reduced the size of this lesion. Colonoscopy approximately 1&1/2 years ago was negative.    Objective:   Physical Exam  Gen.: Healthy and well-nourished in appearance. Alert, appropriate and cooperative throughout exam.  Eyes: No corneal or conjunctival inflammation noted. No icterus Nose: External nasal exam reveals no deformity or inflammation. Nasal mucosa are pink and moist. No lesions or exudates noted. No scabs or bleeding  Mouth: Oral mucosa and oropharynx reveal no lesions or exudates. Teeth in good repair.  Lungs: Normal respiratory effort; chest expands symmetrically. Lungs are clear to auscultation without rales, wheezes, or increased work of breathing. Heart: Slow rate and regular rhythm. Normal S1 and S2. No gallop, click, or rub. No murmur. Abdomen: Bowel sounds normal; abdomen soft and  nontender. No masses, organomegaly or hernias noted.                                                                       Musculoskeletal/extremities: There is very faint right forearm ecchymosis. There is minimal tenderness at the right medial forearm superiorly. There is dramatic ecchymosis over the right posterior medial thigh and tenderness over the tendons in this area. Homans sign is negative Vascular: Carotid, radial artery, dorsalis pedis and  posterior tibial pulses are full and equal. Neurologic: Alert and oriented x3. Deep tendon reflexes symmetrical and normal.          Skin: Intact without suspicious lesions or rashes except for the bruising as noted above. Lymph: No cervical, axillary, epitrochlear or inguinal lymphadenopathy present. Psych: Mood and affect are normal. Normally interactive  Assessment & Plan:  #1 ecchymosis and soft tissue pain; muscle tear is suggested. If clotting studies are negative; physical therapy consult recommended. Warm compresses will help resolve the ecchymosis  #2 history of mild epistaxis; no active process at this time. This may relate to nasal drying. Eucerin as needed would be recommended  #3 mild postural hypotension symptoms. Isometrics recommended  #4 pararectal lesion; probable external hemorrhoid. Not visualized clinically. Hemoccult testing negative

## 2011-12-01 NOTE — Telephone Encounter (Signed)
Caller: Allen/Patient; PCP: Marga Melnick; CB#: [phone];  Call regarding Spontaneous Bruising during golf swing - once at  right elbow and then on right side below buttocks.  Easily and feeling mildly faint for approx 2 weeks. Emergent sx ruled out.   Appt. w/ Dr. Alwyn Ren  at [phone] per Skin Lesions protocol

## 2011-12-01 NOTE — Patient Instructions (Signed)
Use Eucerin, a moisturizing agent , twice a day  for the dry nasal mucosa .Elevation will help the bruising to resolve. Use warm moist compresses 3 times a day as discussed.  Please try to go on My Chart within the next 24 hours to allow me to release the results directly to you.

## 2012-03-02 ENCOUNTER — Other Ambulatory Visit (INDEPENDENT_AMBULATORY_CARE_PROVIDER_SITE_OTHER): Payer: 59

## 2012-03-02 DIAGNOSIS — E119 Type 2 diabetes mellitus without complications: Secondary | ICD-10-CM

## 2012-03-02 LAB — MICROALBUMIN / CREATININE URINE RATIO: Microalb Creat Ratio: 0.8 mg/g (ref 0.0–30.0)

## 2012-07-11 ENCOUNTER — Other Ambulatory Visit: Payer: Self-pay | Admitting: Internal Medicine

## 2012-07-11 DIAGNOSIS — E119 Type 2 diabetes mellitus without complications: Secondary | ICD-10-CM

## 2012-07-11 MED ORDER — SITAGLIPTIN PHOS-METFORMIN HCL 50-1000 MG PO TABS: 1.0000 | ORAL_TABLET | Freq: Two times a day (BID) | ORAL | Status: DC

## 2012-07-11 MED ORDER — EZETIMIBE-SIMVASTATIN 10-20 MG PO TABS: ORAL_TABLET | ORAL | Status: DC

## 2012-07-11 NOTE — Telephone Encounter (Signed)
refill x 2 janumet & Vytorin  1-Janumet 50-100 1-tablet twice a day with meal #180  2-Vytorin 10-20 1-tablet at bedtime  Send to [facility]

## 2012-07-11 NOTE — Telephone Encounter (Signed)
RX's sent, patient with pending appointment 08/2012

## 2012-08-12 ENCOUNTER — Other Ambulatory Visit: Payer: Self-pay

## 2012-08-30 ENCOUNTER — Other Ambulatory Visit (INDEPENDENT_AMBULATORY_CARE_PROVIDER_SITE_OTHER): Payer: 59

## 2012-08-30 DIAGNOSIS — IMO0001 Reserved for inherently not codable concepts without codable children: Secondary | ICD-10-CM

## 2012-09-19 ENCOUNTER — Encounter: Payer: Self-pay | Admitting: Internal Medicine

## 2013-01-03 ENCOUNTER — Other Ambulatory Visit: Payer: 59

## 2013-01-11 ENCOUNTER — Other Ambulatory Visit (INDEPENDENT_AMBULATORY_CARE_PROVIDER_SITE_OTHER): Payer: 59

## 2013-01-11 DIAGNOSIS — E119 Type 2 diabetes mellitus without complications: Secondary | ICD-10-CM

## 2013-04-24 ENCOUNTER — Other Ambulatory Visit: Payer: Self-pay | Admitting: Internal Medicine

## 2013-04-24 NOTE — Telephone Encounter (Signed)
Janumet and Vytorin refills sent to pharmacy

## 2013-05-03 ENCOUNTER — Other Ambulatory Visit: Payer: Self-pay

## 2013-05-11 ENCOUNTER — Other Ambulatory Visit (INDEPENDENT_AMBULATORY_CARE_PROVIDER_SITE_OTHER): Payer: 59

## 2013-05-11 LAB — HEMOGLOBIN A1C: Hgb A1c MFr Bld: 7 % — ABNORMAL HIGH (ref 4.6–6.5)

## 2013-05-12 ENCOUNTER — Other Ambulatory Visit: Payer: Self-pay | Admitting: Internal Medicine

## 2013-05-14 NOTE — Telephone Encounter (Signed)
Janumet refilled for 30 days. Pt has appt scheduled in December 2014

## 2013-05-21 ENCOUNTER — Other Ambulatory Visit: Payer: Self-pay | Admitting: *Deleted

## 2013-05-21 ENCOUNTER — Encounter: Payer: Self-pay | Admitting: Internal Medicine

## 2013-05-21 MED ORDER — COLCHICINE 0.6 MG PO TABS: ORAL_TABLET | ORAL | Status: DC

## 2013-05-21 NOTE — Telephone Encounter (Signed)
Colcrys script sent to pharmacy

## 2013-05-28 ENCOUNTER — Ambulatory Visit (INDEPENDENT_AMBULATORY_CARE_PROVIDER_SITE_OTHER): Payer: 59 | Admitting: Internal Medicine

## 2013-05-28 ENCOUNTER — Other Ambulatory Visit: Payer: Self-pay | Admitting: *Deleted

## 2013-05-28 ENCOUNTER — Encounter: Payer: Self-pay | Admitting: Internal Medicine

## 2013-05-28 VITALS — BP 120/76 | HR 51 | Temp 96.6°F | Ht 70.5 in | Wt 194.0 lb

## 2013-05-28 DIAGNOSIS — E785 Hyperlipidemia, unspecified: Secondary | ICD-10-CM

## 2013-05-28 DIAGNOSIS — E119 Type 2 diabetes mellitus without complications: Secondary | ICD-10-CM

## 2013-05-28 DIAGNOSIS — N400 Enlarged prostate without lower urinary tract symptoms: Secondary | ICD-10-CM

## 2013-05-28 DIAGNOSIS — Z23 Encounter for immunization: Secondary | ICD-10-CM

## 2013-05-28 MED ORDER — DAPAGLIFLOZIN PROPANEDIOL 5 MG PO TABS: 5.0000 mg | ORAL_TABLET | Freq: Every day | ORAL | Status: DC

## 2013-05-28 MED ORDER — GLUCOSE BLOOD VI STRP: ORAL_STRIP | Status: DC

## 2013-05-28 MED ORDER — SIMVASTATIN 20 MG PO TABS: 20.0000 mg | ORAL_TABLET | Freq: Every day | ORAL | Status: DC

## 2013-05-28 NOTE — Assessment & Plan Note (Signed)
Add Farxiga 5 mg

## 2013-05-28 NOTE — Assessment & Plan Note (Signed)
Generic Simvastain 20 mg in place of Vytorin 10/20

## 2013-05-28 NOTE — Patient Instructions (Signed)
Please schedule a physical exam in 10-11 weeks

## 2013-05-28 NOTE — Progress Notes (Signed)
Pre visit review using our clinic review tool, if applicable. No additional management support is needed unless otherwise documented below in the visit note. 

## 2013-05-28 NOTE — Progress Notes (Signed)
Subjective:    Patient ID: Luke West, male    DOB: 08/06/1955, 57 y.o.   MRN: [id number]  HPI The patient is here for followup of diabetes &  hyperlipidemia .  The most recent A1c was 7 %  , which correlates to an average sugar of 172  , and long-term risk of  40 % . Fasting blood sugar average 164 .  Highest two-hour postprandial glucose is <  200 . No hypoglycemia reported Last ophthalmologic examination 9/14 revealed no retinopathy. No active podiatry assessment on record. Diet is low carb ,sugar restricted heart healthy. Exercise  irregular 5 X/ week as weights &  CVE for 30-60 minutes.  The most recent lipids  5/13 reveal LDL 79 ;  52.0; and triglycerides  119  . There is medical compliance with the statin.  Blood pressure not monitored  .   No  medication adverse effects suggested .        Review of Systems  Constitutional: Significant weight change 180 to 190 recently. No  excess fatigue Eyes: No  blurred vision;  double vision ; loss of vision Cardiovascular: no chest pain ;palpitations; racing; irregular rhythm ;syncope; claudication ; edema  Respiratory: No exertional dyspnea;  paroxysmal nocturnal dyspnea Genitourinary: No dysuria; hematuria ; pyuria; frequency;  Incontinence;  nocturia Musculoskeletal: No myalgias or muscle cramping  Dermatologic: No non healing  Lesions; change in color or temperature of skin Neurologic: No  limb weakness;  numbness or tingling;  burning Endocrine: No change in hair, skin, nails. No excessive thirst; excessive hunger;  excessive urination GI:  loose - watery stool over past 6 months; better with OTC probiotic . Stools now loose - firm     Objective:   Physical Exam Gen.: Thin healthy and well-nourished in appearance. Alert, appropriate and cooperative throughout exam.   Head: Normocephalic without obvious abnormalities; pattern alopecia  Eyes: No corneal or conjunctival inflammation noted. Pupils equal round  reactive to light and accommodation. Extraocular motion intact.  Ears: External  ear exam reveals no significant lesions or deformities. Canals clear .TMs normal. Hearing is grossly normal bilaterally. Nose: External nasal exam reveals no deformity or inflammation. Nasal mucosa are pink and moist. No lesions or exudates noted. Septum slightly dislocated  Mouth: Oral mucosa and oropharynx reveal no lesions or exudates. Teeth in good repair. Neck: No deformities, masses, or tenderness noted.  Thyroid normal. Lungs: Normal respiratory effort; chest expands symmetrically. Lungs are clear to auscultation without rales, wheezes, or increased work of breathing. Heart: Slow rate and regular  rhythm. Normal S1 and S2. No gallop, click, or rub. No murmur. Abdomen: Bowel sounds normal; abdomen soft and nontender. No masses, organomegaly or hernias noted. Genitalia:  as per Urology                                  Musculoskeletal/extremities: No deformity or scoliosis noted of  the thoracic or lumbar spine.  No clubbing, cyanosis, edema, or significant extremity  deformity noted. Tone & strength normal. Hand joints normal . Fingernail / toenail health good. Able to lie down & sit up w/o help.  Vascular: Carotid, radial artery, dorsalis pedis and  posterior tibial pulses are full and equal. No bruits present. Neurologic: Alert and oriented x3. Deep tendon reflexes symmetrical and normal.         Skin: Intact without suspicious lesions or rashes. Lymph: No cervical, axillary lymphadenopathy present.  Psych: Mood and affect are normal. Normally interactive                                                                                        Assessment & Plan:  See Current Assessment & Plan in Problem List under specific Diagnosis

## 2013-06-04 ENCOUNTER — Other Ambulatory Visit: Payer: Self-pay | Admitting: Internal Medicine

## 2013-06-04 NOTE — Telephone Encounter (Signed)
Vytorin refilled per protocol

## 2013-07-23 ENCOUNTER — Telehealth: Payer: Self-pay | Admitting: *Deleted

## 2013-07-23 NOTE — Telephone Encounter (Signed)
Patient is calling regarding his JANUMET 50-1000 MG per tablet for a refill. He called [facility] [place] and they stated that he needed a prior authorization and also we denied it. Please advise

## 2013-07-23 NOTE — Telephone Encounter (Signed)
PA started. JG//CMA

## 2013-07-25 NOTE — Telephone Encounter (Signed)
Patient states he is almost out of [place]. He states he says he has a smaller dosage of samples that he will take in the meantime. Do we have any samples available of the 50-1000?

## 2013-07-26 NOTE — Telephone Encounter (Signed)
Pt states his insurance will cover [place] xr, Jentadueto, and [employer]. Pt uses [facility].

## 2013-07-26 NOTE — Telephone Encounter (Signed)
Spoke with the pt and informed him that Dr. Park Meo the samples of Janumet 50-1000mg .  Informed him that the samples will be put up front for him to pick-up.   Also informed the pt that Dr. Linna Darner stated that he can check his formulary to see what other meds in the same class he may can switch to.  Pt agreed. //AB/CMA

## 2013-07-26 NOTE — Telephone Encounter (Signed)
Change to Kombiglyze XR 2.5 /1000 bid # 180. Check A1c , BMET after 12  weeks

## 2013-07-26 NOTE — Telephone Encounter (Signed)
LM @ (2:22pm) asking the pt to RTC regarding note below.//AB/CMA

## 2013-07-26 NOTE — Telephone Encounter (Signed)
Please advice.//AB/CMA 

## 2013-07-27 ENCOUNTER — Other Ambulatory Visit: Payer: Self-pay | Admitting: *Deleted

## 2013-07-27 MED ORDER — SAXAGLIPTIN-METFORMIN ER 2.5-1000 MG PO TB24: ORAL_TABLET | ORAL | Status: DC

## 2013-07-27 NOTE — Telephone Encounter (Signed)
Medication e-scribed to pharmacy. He will begin after finishing all of his Janumet. JG//CMA

## 2013-08-08 ENCOUNTER — Encounter: Payer: Self-pay | Admitting: Internal Medicine

## 2013-08-08 ENCOUNTER — Other Ambulatory Visit: Payer: Self-pay | Admitting: Internal Medicine

## 2013-08-08 DIAGNOSIS — Z0000 Encounter for general adult medical examination without abnormal findings: Secondary | ICD-10-CM

## 2013-08-10 ENCOUNTER — Other Ambulatory Visit (INDEPENDENT_AMBULATORY_CARE_PROVIDER_SITE_OTHER): Payer: 59

## 2013-08-10 DIAGNOSIS — Z0000 Encounter for general adult medical examination without abnormal findings: Secondary | ICD-10-CM

## 2013-08-10 LAB — CBC WITH DIFFERENTIAL/PLATELET
Basophils Absolute: 0 10*3/uL (ref 0.0–0.1)
Basophils Relative: 0.3 % (ref 0.0–3.0)
Eosinophils Absolute: 0.1 10*3/uL (ref 0.0–0.7)
Eosinophils Relative: 1.7 % (ref 0.0–5.0)
HEMATOCRIT: 45.7 % (ref 39.0–52.0)
HEMOGLOBIN: 14.9 g/dL (ref 13.0–17.0)
LYMPHS ABS: 1.4 10*3/uL (ref 0.7–4.0)
Lymphocytes Relative: 28.1 % (ref 12.0–46.0)
MCHC: 32.6 g/dL (ref 30.0–36.0)
MCV: 96.2 fl (ref 78.0–100.0)
MONO ABS: 0.5 10*3/uL (ref 0.1–1.0)
Monocytes Relative: 9.2 % (ref 3.0–12.0)
NEUTROS ABS: 3 10*3/uL (ref 1.4–7.7)
Neutrophils Relative %: 60.7 % (ref 43.0–77.0)
Platelets: 161 10*3/uL (ref 150.0–400.0)
RBC: 4.75 Mil/uL (ref 4.22–5.81)
RDW: 12.5 % (ref 11.5–14.6)
WBC: 5 10*3/uL (ref 4.5–10.5)

## 2013-08-10 LAB — BASIC METABOLIC PANEL
BUN: 18 mg/dL (ref 6–23)
CHLORIDE: 104 meq/L (ref 96–112)
CO2: 25 meq/L (ref 19–32)
CREATININE: 1.1 mg/dL (ref 0.4–1.5)
Calcium: 9.8 mg/dL (ref 8.4–10.5)
GFR: 70.29 mL/min (ref >60.00)
Glucose, Bld: 175 mg/dL — ABNORMAL HIGH (ref 70–99)
Potassium: 4.9 mEq/L (ref 3.5–5.1)
Sodium: 139 mEq/L (ref 135–145)

## 2013-08-10 LAB — MICROALBUMIN / CREATININE URINE RATIO
Creatinine,U: 94.5 mg/dL
Microalb Creat Ratio: 0.7 mg/g (ref 0.0–30.0)
Microalb, Ur: 0.7 mg/dL (ref 0.0–1.9)

## 2013-08-10 LAB — LIPID PANEL
CHOL/HDL RATIO: 4
Cholesterol: 189 mg/dL (ref 0–200)
HDL: 43.8 mg/dL (ref >39.00)
LDL Cholesterol: 125 mg/dL — ABNORMAL HIGH (ref 0–99)
TRIGLYCERIDES: 102 mg/dL (ref 0.0–149.0)
VLDL: 20.4 mg/dL (ref 0.0–40.0)

## 2013-08-10 LAB — TSH: TSH: 2.21 u[IU]/mL (ref 0.35–5.50)

## 2013-08-10 LAB — HEPATIC FUNCTION PANEL
ALT: 15 U/L (ref 0–53)
AST: 19 U/L (ref 0–37)
Albumin: 4.3 g/dL (ref 3.5–5.2)
Alkaline Phosphatase: 56 U/L (ref 39–117)
BILIRUBIN DIRECT: 0 mg/dL (ref 0.0–0.3)
TOTAL PROTEIN: 7.1 g/dL (ref 6.0–8.3)
Total Bilirubin: 1 mg/dL (ref 0.3–1.2)

## 2013-08-10 LAB — HEMOGLOBIN A1C: HEMOGLOBIN A1C: 7.1 % — AB (ref 4.6–6.5)

## 2013-08-13 ENCOUNTER — Ambulatory Visit (INDEPENDENT_AMBULATORY_CARE_PROVIDER_SITE_OTHER): Payer: 59 | Admitting: Internal Medicine

## 2013-08-13 ENCOUNTER — Encounter: Payer: Self-pay | Admitting: Internal Medicine

## 2013-08-13 VITALS — BP 130/70 | HR 63 | Temp 97.6°F | Resp 12 | Ht 70.0 in | Wt 193.6 lb

## 2013-08-13 DIAGNOSIS — Z23 Encounter for immunization: Secondary | ICD-10-CM

## 2013-08-13 DIAGNOSIS — Z0000 Encounter for general adult medical examination without abnormal findings: Secondary | ICD-10-CM

## 2013-08-13 DIAGNOSIS — E785 Hyperlipidemia, unspecified: Secondary | ICD-10-CM

## 2013-08-13 DIAGNOSIS — E119 Type 2 diabetes mellitus without complications: Secondary | ICD-10-CM

## 2013-08-13 DIAGNOSIS — M109 Gout, unspecified: Secondary | ICD-10-CM

## 2013-08-13 MED ORDER — ATORVASTATIN CALCIUM 20 MG PO TABS: 20.0000 mg | ORAL_TABLET | Freq: Every day | ORAL | Status: DC

## 2013-08-13 NOTE — Progress Notes (Signed)
   Subjective:    Patient ID: Luke West, male    DOB: December 02, 1955, 58 y.o.   MRN: [id number]  HPI   He is here for a physical;acute issues include explosive diarrhea ,responsive to Florastor.     Review of Systems  Diabetes :  FBS range/average: 151-152 Highest 2 hr post meal glucose:< 160 Medication compliance:yes Hypoglycemia:no Ophthamology care:UTD Podiatry care:not current  HYPERLIPIDEMIA: Disease Monitoring: Medication Compliance:yes  Chest pain, palpitations: no    Dyspnea:no Edema:no Claudication: no Lightheadedness,Syncope:no Weight gain/loss:not significant Polyuria/phagia/dipsia: no,nocturia 1-2X     Blurred vision /diplopia/[place] vision:no Limb numbness/tingling/burning:no Non healing skin lesions:no,intermittent Psoriasis Abd pain, bowel changes: see above, colonoscopy UTD  Myalgias: no Mdemory loss:no       Objective:   Physical Exam Gen.: Healthy and well-nourished in appearance. Alert, appropriate and cooperative throughout exam. Appears younger than stated age  Head: Normocephalic without obvious abnormalities; pattern alopecia  Eyes: No corneal or conjunctival inflammation noted. Pupils equal round reactive to light and accommodation. Extraocular motion intact.  Ears: External  ear exam reveals no significant lesions or deformities. Canals clear .TMs normal. Hearing is grossly normal bilaterally. Nose: External nasal exam reveals no deformity or inflammation. Nasal mucosa are pink and moist. No lesions or exudates noted.  Mouth: Oral mucosa and oropharynx reveal no lesions or exudates. Teeth in good repair. Neck: No deformities, masses, or tenderness noted. Range of motion & Thyroid normal. Lungs: Normal respiratory effort; chest expands symmetrically. Lungs are clear to auscultation without rales, wheezes, or increased work of breathing. Heart: Normal rate and rhythm. Normal S1 and S2. No gallop, click, or rub. No murmur. Abdomen: Bowel sounds  normal; abdomen soft and nontender. No masses, organomegaly or hernias noted. No AAA Genitalia: as per Urology                                  Musculoskeletal/extremities: No deformity or scoliosis noted of  the thoracic or lumbar spine.   No clubbing, cyanosis, edema, or significant extremity  deformity noted. Range of motion normal .Tone & strength normal. Hand joints normal . Fingernail Melene Plan health good. Able to lie down & sit up w/o help. Negative SLR bilaterally Vascular: Carotid, radial artery, dorsalis pedis and  posterior tibial pulses are full and equal. No bruits present. Neurologic: Alert and oriented x3. Deep tendon reflexes symmetrical and normal.  Light touch normal over feet. Gait normal        Skin: Intact without suspicious lesions or rashes. Lymph: No cervical, axillary lymphadenopathy present. Psych: Mood and affect are normal. Normally interactive                                                                                        Assessment & Plan:  #1 comprehensive physical exam; no acute findings  Plan: see Orders  & Recommendations

## 2013-08-13 NOTE — Patient Instructions (Signed)
Your next office appointment will be 2-3 days after your pending labs in 6/15

## 2013-08-13 NOTE — Progress Notes (Signed)
Pre visit review using our clinic review tool, if applicable. No additional management support is needed unless otherwise documented below in the visit note. 

## 2013-08-17 ENCOUNTER — Telehealth: Payer: Self-pay

## 2013-08-17 NOTE — Telephone Encounter (Signed)
Relevant patient education assigned to patient using Emmi. ° °

## 2013-09-08 ENCOUNTER — Other Ambulatory Visit: Payer: Self-pay | Admitting: Internal Medicine

## 2013-11-09 ENCOUNTER — Other Ambulatory Visit: Payer: Self-pay | Admitting: *Deleted

## 2013-11-09 MED ORDER — DAPAGLIFLOZIN PROPANEDIOL 5 MG PO TABS: ORAL_TABLET | ORAL | Status: DC

## 2013-12-05 ENCOUNTER — Other Ambulatory Visit (INDEPENDENT_AMBULATORY_CARE_PROVIDER_SITE_OTHER): Payer: 59

## 2013-12-05 DIAGNOSIS — E119 Type 2 diabetes mellitus without complications: Secondary | ICD-10-CM

## 2013-12-05 DIAGNOSIS — E785 Hyperlipidemia, unspecified: Secondary | ICD-10-CM

## 2013-12-05 DIAGNOSIS — M109 Gout, unspecified: Secondary | ICD-10-CM

## 2013-12-05 LAB — CK: CK TOTAL: 110 U/L (ref 7–232)

## 2013-12-05 LAB — HEPATIC FUNCTION PANEL
ALBUMIN: 4.4 g/dL (ref 3.5–5.2)
ALT: 19 U/L (ref 0–53)
AST: 23 U/L (ref 0–37)
Alkaline Phosphatase: 61 U/L (ref 39–117)
Bilirubin, Direct: 0.1 mg/dL (ref 0.0–0.3)
Total Bilirubin: 0.8 mg/dL (ref 0.2–1.2)
Total Protein: 7 g/dL (ref 6.0–8.3)

## 2013-12-05 LAB — LIPID PANEL
CHOLESTEROL: 203 mg/dL — AB (ref 0–200)
HDL: 51.4 mg/dL (ref >39.00)
LDL Cholesterol: 114 mg/dL — ABNORMAL HIGH (ref 0–99)
NonHDL: 151.6
Total CHOL/HDL Ratio: 4
Triglycerides: 190 mg/dL — ABNORMAL HIGH (ref 0.0–149.0)
VLDL: 38 mg/dL (ref 0.0–40.0)

## 2013-12-05 LAB — URIC ACID: URIC ACID, SERUM: 6.9 mg/dL (ref 4.0–7.8)

## 2013-12-05 LAB — HEMOGLOBIN A1C: Hgb A1c MFr Bld: 8 % — ABNORMAL HIGH (ref 4.6–6.5)

## 2013-12-11 ENCOUNTER — Ambulatory Visit (INDEPENDENT_AMBULATORY_CARE_PROVIDER_SITE_OTHER): Payer: 59 | Admitting: Internal Medicine

## 2013-12-11 ENCOUNTER — Encounter: Payer: Self-pay | Admitting: Internal Medicine

## 2013-12-11 VITALS — BP 130/70 | HR 66 | Temp 97.7°F | Wt 192.2 lb

## 2013-12-11 DIAGNOSIS — G4762 Sleep related leg cramps: Secondary | ICD-10-CM | POA: Insufficient documentation

## 2013-12-11 DIAGNOSIS — E119 Type 2 diabetes mellitus without complications: Secondary | ICD-10-CM

## 2013-12-11 DIAGNOSIS — E785 Hyperlipidemia, unspecified: Secondary | ICD-10-CM

## 2013-12-11 DIAGNOSIS — M109 Gout, unspecified: Secondary | ICD-10-CM

## 2013-12-11 NOTE — Assessment & Plan Note (Signed)
No change as UA 6.9

## 2013-12-11 NOTE — Assessment & Plan Note (Signed)
Please schedule A1c & urine microalbumin in 4 months after nutritional & exercise interventions as discussed.

## 2013-12-11 NOTE — Progress Notes (Signed)
Pre visit review using our clinic review tool, if applicable. No additional management support is needed unless otherwise documented below in the visit note. 

## 2013-12-11 NOTE — Assessment & Plan Note (Signed)
Recheck lipids after 4 mos of nutrition & resumption of CVE @ higher level as per his request

## 2013-12-11 NOTE — Patient Instructions (Signed)
Isometric exercises prior to going to bed as discussed. Cal/Mag ( calcium/ magnesium) at bedtime as needed for leg discomfort.

## 2013-12-11 NOTE — Progress Notes (Signed)
   Subjective:    Patient ID: Luke West, male    DOB: 08/22/1955, 58 y.o.   MRN: [id number]  HPI He is here to assess status of active health conditions:  Diet/ nutrition:suboptimal since early May Exercise program: decreased CVE , 3 X / week vs 5X/week. Labs reviewed & discussed.  Diabetes : A1c 8% which is average gluose 207 & 60 % increased risk FBS range/average: no checks X 6 weeks; FBS 141 this am Highest 2 hr post meal glucose: no regular checking but < 180 Medication compliance: irregular since May in reference to day time meds due to activities Hypoglycemia:no Ophthamology care: UTD Podiatry care:not UTD  HYPERLIPIDEMIA: Disease Monitoring: Medication Compliance:never misses qhs statin. On Atorvastatin 20 mg for almost 3 mos. Vytorin not covered.     Review of Systems  Chest pain, palpitations:no       Dyspnea:no Edema:no Claudication: no Lightheadedness,Syncope:very rarely Weight gain/loss: up 6 # Polyuria/phagia/dipsia:no but frequency with decreased flow. Dr Junious Silk seen annually      Blurred vision [place] vision:no Limb numbness/tingling/burning:no Non healing skin lesions:no Abd pain, bowel changes: some loose stool , better with Probiotics  Myalgias: no  But occasional thigh cramps. Memory loss: no       Objective:   Physical Exam AThin but  healthy and well-nourished & in no acute distress  No carotid bruits are present.No neck pain distention present at 10 - 15 degrees. Thyroid normal to palpation  Heart rhythm and rate are normal with no gallop or murmur  Chest is clear with no increased work of breathing  There is no evidence of aortic aneurysm or renal artery bruits  Abdomen soft with no organomegaly or masses. No HJR  No clubbing, cyanosis or edema present.  Pedal pulses are intact   No ischemic skin changes are present . Fingernails/ toenails healthy   Alert and oriented. Strength, tone, DTRs reflexes  normal          Assessment & Plan:  See Current Assessment & Plan in Problem List under specific Diagnosis

## 2013-12-11 NOTE — Assessment & Plan Note (Signed)
Please perform isometric exercises before going to bed. Sit on side of the bed and raise up on toes to a count of 5. Then put pressure on the heels to a count of 5. Repeat this process 10 times. This will improve blood flow to the calves & help prevent cramps. Cal/Mag ( calcium/ magnesium) at bedtime as needed for leg discomfort.

## 2013-12-13 ENCOUNTER — Ambulatory Visit: Payer: 59 | Admitting: Internal Medicine

## 2014-01-17 ENCOUNTER — Other Ambulatory Visit: Payer: Self-pay

## 2014-01-17 MED ORDER — GLUCOSE BLOOD VI STRP: 1.0000 | ORAL_STRIP | Freq: Two times a day (BID) | Status: DC

## 2014-01-17 NOTE — Telephone Encounter (Signed)
Please specify how many times a day the patient is to test blood sugar so directions can be placed on the prescription.

## 2014-02-11 ENCOUNTER — Other Ambulatory Visit: Payer: Self-pay | Admitting: Internal Medicine

## 2014-03-25 LAB — HM DIABETES EYE EXAM

## 2014-04-16 ENCOUNTER — Telehealth: Payer: Self-pay | Admitting: Internal Medicine

## 2014-04-16 DIAGNOSIS — E1165 Type 2 diabetes mellitus with hyperglycemia: Secondary | ICD-10-CM

## 2014-04-16 DIAGNOSIS — IMO0002 Reserved for concepts with insufficient information to code with codable children: Secondary | ICD-10-CM

## 2014-04-16 DIAGNOSIS — E785 Hyperlipidemia, unspecified: Secondary | ICD-10-CM

## 2014-04-16 NOTE — Telephone Encounter (Signed)
Orders have been placed.

## 2014-04-16 NOTE — Telephone Encounter (Signed)
Patient had a message in my chart stating that he needed to get to get lab work done, there are no order in. Please advise

## 2014-07-10 ENCOUNTER — Encounter: Payer: Self-pay | Admitting: Internal Medicine

## 2014-07-11 ENCOUNTER — Other Ambulatory Visit: Payer: Self-pay | Admitting: Internal Medicine

## 2014-07-11 DIAGNOSIS — Z8739 Personal history of other diseases of the musculoskeletal system and connective tissue: Secondary | ICD-10-CM

## 2014-07-11 DIAGNOSIS — E782 Mixed hyperlipidemia: Secondary | ICD-10-CM

## 2014-07-11 DIAGNOSIS — IMO0002 Reserved for concepts with insufficient information to code with codable children: Secondary | ICD-10-CM

## 2014-07-11 DIAGNOSIS — E1165 Type 2 diabetes mellitus with hyperglycemia: Secondary | ICD-10-CM

## 2014-07-17 ENCOUNTER — Other Ambulatory Visit (INDEPENDENT_AMBULATORY_CARE_PROVIDER_SITE_OTHER): Payer: 59

## 2014-07-17 DIAGNOSIS — E1165 Type 2 diabetes mellitus with hyperglycemia: Secondary | ICD-10-CM

## 2014-07-17 DIAGNOSIS — Z8639 Personal history of other endocrine, nutritional and metabolic disease: Secondary | ICD-10-CM

## 2014-07-17 DIAGNOSIS — Z8739 Personal history of other diseases of the musculoskeletal system and connective tissue: Secondary | ICD-10-CM

## 2014-07-17 DIAGNOSIS — E782 Mixed hyperlipidemia: Secondary | ICD-10-CM

## 2014-07-17 DIAGNOSIS — IMO0002 Reserved for concepts with insufficient information to code with codable children: Secondary | ICD-10-CM

## 2014-07-17 LAB — BASIC METABOLIC PANEL
BUN: 23 mg/dL (ref 6–23)
CHLORIDE: 101 meq/L (ref 96–112)
CO2: 27 mEq/L (ref 19–32)
Calcium: 9.8 mg/dL (ref 8.4–10.5)
Creatinine, Ser: 1.48 mg/dL (ref 0.40–1.50)
GFR: 51.84 mL/min — AB (ref >60.00)
GLUCOSE: 189 mg/dL — AB (ref 70–99)
Potassium: 5 mEq/L (ref 3.5–5.1)
Sodium: 136 mEq/L (ref 135–145)

## 2014-07-17 LAB — MICROALBUMIN / CREATININE URINE RATIO
Creatinine,U: 98.6 mg/dL
MICROALB UR: 0.8 mg/dL (ref 0.0–1.9)
MICROALB/CREAT RATIO: 0.8 mg/g (ref 0.0–30.0)

## 2014-07-17 LAB — TSH: TSH: 2.02 u[IU]/mL (ref 0.35–4.50)

## 2014-07-17 LAB — HEPATIC FUNCTION PANEL
ALBUMIN: 4.4 g/dL (ref 3.5–5.2)
ALK PHOS: 64 U/L (ref 39–117)
ALT: 14 U/L (ref 0–53)
AST: 19 U/L (ref 0–37)
BILIRUBIN DIRECT: 0.1 mg/dL (ref 0.0–0.3)
Total Bilirubin: 0.7 mg/dL (ref 0.2–1.2)
Total Protein: 6.8 g/dL (ref 6.0–8.3)

## 2014-07-17 LAB — URIC ACID: URIC ACID, SERUM: 5.4 mg/dL (ref 4.0–7.8)

## 2014-07-17 LAB — HEMOGLOBIN A1C: HEMOGLOBIN A1C: 7.8 % — AB (ref 4.6–6.5)

## 2014-07-17 LAB — LIPID PANEL
CHOL/HDL RATIO: 4
Cholesterol: 173 mg/dL (ref 0–200)
HDL: 42.9 mg/dL (ref >39.00)
LDL Cholesterol: 95 mg/dL (ref 0–99)
NonHDL: 130.1
TRIGLYCERIDES: 174 mg/dL — AB (ref 0.0–149.0)
VLDL: 34.8 mg/dL (ref 0.0–40.0)

## 2014-07-23 ENCOUNTER — Encounter: Payer: Self-pay | Admitting: Internal Medicine

## 2014-07-23 ENCOUNTER — Ambulatory Visit (INDEPENDENT_AMBULATORY_CARE_PROVIDER_SITE_OTHER): Payer: 59 | Admitting: Internal Medicine

## 2014-07-23 VITALS — BP 114/70 | HR 54 | Temp 98.3°F | Resp 15 | Ht 70.25 in | Wt 192.8 lb

## 2014-07-23 DIAGNOSIS — E1165 Type 2 diabetes mellitus with hyperglycemia: Secondary | ICD-10-CM

## 2014-07-23 DIAGNOSIS — E782 Mixed hyperlipidemia: Secondary | ICD-10-CM

## 2014-07-23 DIAGNOSIS — IMO0002 Reserved for concepts with insufficient information to code with codable children: Secondary | ICD-10-CM

## 2014-07-23 NOTE — Assessment & Plan Note (Signed)
Nutritional & alcohol interventions discussed

## 2014-07-23 NOTE — Progress Notes (Signed)
Pre visit review using our clinic review tool, if applicable. No additional management support is needed unless otherwise documented below in the visit note. 

## 2014-07-23 NOTE — Assessment & Plan Note (Addendum)
Actos addition discussed but Endocrine referral to R/O Type 1 DM is best option

## 2014-07-23 NOTE — Patient Instructions (Signed)
The Endocrinology  referral will be scheduled and you'll be notified of the time.Please call the Referral Co-Ordinator @ [phone] if you have not been notified of appointment time within 7-10 days.

## 2014-07-23 NOTE — Progress Notes (Signed)
   Subjective:    Patient ID: Luke West, male    DOB: 1956/03/29, 59 y.o.   MRN: [id number]  HPI   He has had diabetes for at least 13 years. His A1c has been gradually increasing. Most recent value was 7.8%. This is despite 3 agents orally.  His fasting blood sugars have  averaged 144.5 and 2 hour postprandials 173 since May 2015.  Triglycerides have risen to 174; they've been as low as 51 in 2013. He does admit to increased alcohol intake , up to 2-3 drinks per day on average.  He is on a modified low-carb diet; he's not been as "disciplined is in the past"  He's exercising 5 days a week 30-60 minutes without cardio pulmonary symptoms.  Ophthalmologic exam is up-to-date. No retinopathy. He has not seen a podiatrist  Two sibs have been  IDDM  He does have tingling in his extremities three quarters into his cardio exercise program    He's had no gout in recent years. His most recent uric acid was 5.4.    Review of Systems   Chest pain, palpitations, tachycardia, exertional dyspnea, paroxysmal nocturnal dyspnea, claudication or edema are absent.  Polyuria, polyphagia, polydipsia absent.  There is no blurred vision, double vision, or loss of vision.   No postural dizziness noted. Denied are numbness or burning of the extremities.  No nonhealing skin lesions present.  Weight is stable.      Objective:   Physical Exam   Appears healthy and well-nourished & in no acute distress  Pattern alopecia  No carotid bruits are present.No neck vein distention present at 10 - 15 degrees. Thyroid normal to palpation  Heart rhythm normal; rate slow  with no gallop or murmur  Chest is clear with no increased work of breathing  There is no evidence of aortic aneurysm or renal artery bruits  Abdomen soft with no organomegaly or masses. No HJR  No clubbing, cyanosis or edema present.  Pedal pulses are intact   No ischemic skin changes are present . Fingernails/ toenails  healthy   Alert and oriented. Strength, tone, DTRs reflexes normal        Assessment & Plan:  See Current Assessment & Plan in Problem List under specific Diagnosis

## 2014-08-05 ENCOUNTER — Ambulatory Visit: Payer: 59 | Admitting: Internal Medicine

## 2014-08-09 ENCOUNTER — Encounter: Payer: Self-pay | Admitting: Internal Medicine

## 2014-08-09 ENCOUNTER — Ambulatory Visit (INDEPENDENT_AMBULATORY_CARE_PROVIDER_SITE_OTHER): Payer: 59 | Admitting: Internal Medicine

## 2014-08-09 VITALS — BP 124/80 | HR 54 | Temp 97.4°F | Ht 70.25 in | Wt 189.5 lb

## 2014-08-09 DIAGNOSIS — E1165 Type 2 diabetes mellitus with hyperglycemia: Secondary | ICD-10-CM

## 2014-08-09 DIAGNOSIS — IMO0002 Reserved for concepts with insufficient information to code with codable children: Secondary | ICD-10-CM

## 2014-08-09 MED ORDER — CANAGLIFLOZIN 100 MG PO TABS: 100.0000 mg | ORAL_TABLET | Freq: Every day | ORAL | Status: DC

## 2014-08-09 NOTE — Patient Instructions (Addendum)
Please start eating breakfast every day.  Move Kombiglyze XR 2.10-998 mg - 1 tablet in pm and 1 tablet with dinner Stop [place]. Start Invokana 100 mg in am.  Please return in 1 month with your sugar log.   PATIENT INSTRUCTIONS FOR TYPE 2 DIABETES:  **Please join MyChart!** - see attached instructions about how to join if you have not done so already.  DIET AND EXERCISE Diet and exercise is an important part of diabetic treatment.  We recommended aerobic exercise in the form of brisk walking (working between 40-60% of maximal aerobic capacity, similar to brisk walking) for 150 minutes per week (such as 30 minutes five days per week) along with 3 times per week performing 'resistance' training (using various gauge rubber tubes with handles) 5-10 exercises involving the major muscle groups (upper body, lower body and core) performing 10-15 repetitions (or near fatigue) each exercise. Start at half the above goal but build slowly to reach the above goals. If limited by weight, joint pain, or disability, we recommend daily walking in a swimming pool with water up to waist to reduce pressure from joints while allow for adequate exercise.    BLOOD GLUCOSES Monitoring your blood glucoses is important for continued management of your diabetes. Please check your blood glucoses 2-4 times a day: fasting, before meals and at bedtime (you can rotate these measurements - e.g. one day check before the 3 meals, the next day check before 2 of the meals and before bedtime, etc.).   HYPOGLYCEMIA (low blood sugar) Hypoglycemia is usually a reaction to not eating, exercising, or taking too much insulin/ other diabetes drugs.  Symptoms include tremors, sweating, hunger, confusion, headache, etc. Treat IMMEDIATELY with 15 grams of Carbs: . 4 glucose tablets .  cup regular juice/soda . 2 tablespoons raisins . 4 teaspoons sugar . 1 tablespoon honey Recheck blood glucose in 15 mins and repeat above if still  symptomatic/blood glucose <100.  RECOMMENDATIONS TO REDUCE YOUR RISK OF DIABETIC COMPLICATIONS: * Take your prescribed MEDICATION(S) * Follow a DIABETIC diet: Complex carbs, fiber rich foods, (monounsaturated and polyunsaturated) fats * AVOID saturated/trans fats, high fat foods, >2,300 mg salt per day. * EXERCISE at least 5 times a week for 30 minutes or preferably daily.  * DO NOT SMOKE OR DRINK more than 1 drink a day. * Check your FEET every day. Do not wear tightfitting shoes. Contact us if you develop an ulcer * See your EYE doctor once a year or more if needed * Get a FLU shot once a year * Get a PNEUMONIA vaccine once before and once after age 43 years  GOALS:  * Your Hemoglobin A1c of <7%  * fasting sugars need to be <130 * after meals sugars need to be <180 (2h after you start eating) * Your Systolic BP should be 338 or lower  * Your Diastolic BP should be 80 or lower  * Your HDL (Good Cholesterol) should be 40 or higher  * Your LDL (Bad Cholesterol) should be 100 or lower. * Your Triglycerides should be 150 or lower  * Your Urine microalbumin (kidney function) should be <30 * Your Body Mass Index should be 25 or lower    Please consider the following ways to cut down carbs and fat and increase fiber and micronutrients in your diet: - substitute whole grain for white bread or pasta - substitute brown rice for white rice - substitute 90-calorie flat bread pieces for slices of bread when possible -  substitute sweet potatoes or yams for white potatoes - substitute humus for margarine - substitute tofu for cheese when possible - substitute almond or rice milk for regular milk (would not drink soy milk daily due to concern for soy estrogen influence on breast cancer risk) - substitute dark chocolate for other sweets when possible - substitute water - can add lemon or orange slices for taste - for diet sodas (artificial sweeteners will trick your body that you can eat sweets  without getting calories and will lead you to overeating and weight gain in the long run) - do not skip breakfast or other meals (this will slow down the metabolism and will result in more weight gain over time)  - can try smoothies made from fruit and almond/rice milk in am instead of regular breakfast - can also try old-fashioned (not instant) oatmeal made with almond/rice milk in am - order the dressing on the side when eating salad at a restaurant (pour less than half of the dressing on the salad) - eat as little meat as possible - can try juicing, but should not forget that juicing will get rid of the fiber, so would alternate with eating raw veg./fruits or drinking smoothies - use as little oil as possible, even when using olive oil - can dress a salad with a mix of balsamic vinegar and lemon juice, for e.g. - use agave nectar, stevia sugar, or regular sugar rather than artificial sweateners - steam or broil/roast veggies  - snack on veggies/fruit/nuts (unsalted, preferably) when possible, rather than processed foods - reduce or eliminate aspartame in diet (it is in diet sodas, chewing gum, etc) Read the labels!  Try to read Dr. Janene Harvey book: "Program for Reversing Diabetes" for other ideas for healthy eating.

## 2014-08-09 NOTE — Progress Notes (Signed)
Pre visit review using our clinic review tool, if applicable. No additional management support is needed unless otherwise documented below in the visit note. 

## 2014-08-09 NOTE — Progress Notes (Signed)
Patient ID: Luke West, male   DOB: 1956-05-26, 59 y.o.   MRN: [id number]  HPI: Luke West is a 59 y.o.-year-old male, referred by his PCP, Dr. Linna Darner, for management of DM2, dx in 2003, non-insulin-dependent, uncontrolled, with complications (CKD).  Last hemoglobin A1c was: Lab Results  Component Value Date   HGBA1C 7.8* 07/17/2014   HGBA1C 8.0* 12/05/2013   HGBA1C 7.1* 08/10/2013   Pt is on a regimen of: - Kombiglyze XR 2.10-998 mg - one at lunch and one with dinner. - Farxiga 5 mg daily in am On JanuMet before.  Pt checks his sugars 2 a day and they are: - am: 128-190 (202) - alcohol - 2h after b'fast: - before lunch: - 2h after lunch: - before dinner: - 2h after dinner: - bedtime: 119-170 - nighttime: No lows. Lowest sugar was 120;? if he has hypoglycemia awareness. Highest sugar was 200s.  Glucometer: One Touch  Pt's meals are: - Breakfast: 3 days a week, yoghurt  - Lunch: wide variety - Dinner: meat, veggies, strach - Snacks: 1+ - nuts; crackers + PB, chips and salsa  He exercises 5x a week.  - + CKD, last BUN/creatinine:  Lab Results  Component Value Date   BUN 23 07/17/2014   CREATININE 1.48 07/17/2014   - last set of lipids: Lab Results  Component Value Date   CHOL 173 07/17/2014   HDL 42.90 07/17/2014   LDLCALC 95 07/17/2014   TRIG 174.0* 07/17/2014   CHOLHDL 4 07/17/2014  On Lipitor - last eye exam was in 03/2014. Dr. Kathrin Penner.No DR.  - no numbness and tingling in his feet.  Pt has FH of DM in sister, brother, grandfather.  ROS: Constitutional: no weight gain/loss, + fatigue, no subjective hyperthermia/hypothermia, + poor sleep Eyes: no blurry vision, no xerophthalmia ENT: no sore throat, no nodules palpated in throat, no dysphagia/odynophagia, no hoarseness Cardiovascular: no CP/SOB/palpitations/leg swelling Respiratory: no cough/SOB Gastrointestinal: no N/V/+ D/no C Musculoskeletal: no muscle/joint aches Skin: + rash; + easy  bruising Neurological: no tremors/numbness/tingling/dizziness Psychiatric: no depression/anxiety  Past Medical History  Diagnosis Date  . DM (diabetes mellitus)   . Raynaud phenomenon   . [name] syndrome 2008    Total bilirubin 1.4  . Hyperlipemia    Past Surgical History  Procedure Laterality Date  . Shoulder surgery      post dislocation sequellae  . Tonsillectomy and adenoidectomy    . Colonoscopy  2005 & 2010    negative X 2; Dr Watt Climes (due 2020)   History   Social History  . Marital Status: Married    Spouse Name: N/A  . Number of Children: 3   Occupational History  . Real estate development/construction   Social History Main Topics  . Smoking status: Former Smoker    Quit date: 06/28/1990  . Smokeless tobacco: Not on file     Comment: FMB:WGYKZ 3-4 year . Smoked [phone] , up to 1-2 ppd  . Alcohol Use: 8.4 oz/week    14 Glasses of wine per week     Comment:  socially  . Drug Use: No   Current Outpatient Prescriptions on File Prior to Visit  Medication Sig Dispense Refill  . aspirin 81 MG tablet Take 160 mg by mouth daily.    Marland Kitchen atorvastatin (LIPITOR) 20 MG tablet Take 1 tablet by mouth  daily 90 tablet 1  . colchicine 0.6 MG tablet Take one by mouth daily as needed for gout 30 tablet 5  . Dapagliflozin Propanediol (  FARXIGA) 5 MG TABS take 1 tablet by mouth once daily 30 tablet 11  . glucose blood test strip 1 each by Other route 2 (two) times daily. Test blood sugar daily two times day 60 each 11  . KOMBIGLYZE XR 2.10-998 MG TB24 Take 1 tablet by mouth  twice daily 180 tablet 1  . Lactobacillus-Inulin (CULTURELLE DIGESTIVE HEALTH PO) Take 1 capsule by mouth daily.    . Multiple Vitamin (MULTIVITAMIN) tablet Take 1 tablet by mouth daily.     No current facility-administered medications on file prior to visit.   No Known Allergies Family History  Problem Relation Age of Onset  . Aneurysm Mother     cns  . Hyperlipidemia Mother   . Lung cancer Father      smoker  . Heart attack Sister 98  . Diabetes Brother     type 1  . Lung cancer Paternal Uncle     smoker  . Aneurysm Maternal Grandmother     cns;PRE 40 (mid 23s)  . Diabetes Maternal Grandfather     type 1  . Lung cancer Paternal Grandfather     smoker  . Aneurysm Paternal Grandmother     AAA  . Aneurysm Father     AAA   PE: BP 124/80 mmHg  Pulse 54  Temp(Src) 97.4 F (36.3 C) (Oral)  Ht 5' 10.25" (1.784 m)  Wt 189 lb 8 oz (85.957 kg)  BMI 27.01 kg/m2  SpO2 97% Wt Readings from Last 3 Encounters:  08/09/14 189 lb 8 oz (85.957 kg)  07/23/14 192 lb 12 oz (87.431 kg)  12/11/13 192 lb 3.2 oz (87.181 kg)   Constitutional: slightly overweight, in NAD Eyes: PERRLA, EOMI, no exophthalmos ENT: moist mucous membranes, no thyromegaly, no cervical lymphadenopathy Cardiovascular: RRR, No MRG Respiratory: CTA B Gastrointestinal: abdomen soft, NT, ND, BS+ Musculoskeletal: no deformities, strength intact in all 4 Skin: moist, warm, no rashes except rosacea on face Neurological: no tremor with outstretched hands, DTR normal in all 4  ASSESSMENT: 1. DM2, non-insulin-dependent, uncontrolled, with complications - CKD  PLAN:  1. Patient with long-standing, uncontrolled diabetes, on oral antidiabetic regimen, which became insufficient. His sugars are higher in am. He has CKD and Wilder Glade is not indicated in pts with GFR <60, therefore, we will try to switch to Invokana, which is also more powerful >> we may get lower fasting sugars. Also, I would like to move his first dose of Kombiglyze in am (ideally the whole DPP4 inhibitor dose would be in am, but we can split the Wanda when he is ready to run out of it.  - We discussed at length about diet >> suggested several changes and also start eating b'fast and also given a list of healthy substitutions. - We discussed about options for treatment, and I suggested to:  Patient Instructions  Please start eating breakfast every day.  Move  Kombiglyze XR 2.10-998 mg - 1 tablet in pm and 1 tablet with dinner Stop [place]. Start Invokana 100 mg in am.  Please return in 1 month with your sugar log.   - Strongly advised him to start checking sugars at different times of the day - check 2 times a day, rotating checks - given sugar log and advised how to fill it and to bring it at next appt  - given foot care handout and explained the principles  - given instructions for hypoglycemia management "15-15 rule"  - advised for yearly eye exams >> he is UTD -  Return to clinic in 1 mo with sugar log

## 2014-09-04 ENCOUNTER — Encounter: Payer: Self-pay | Admitting: Internal Medicine

## 2014-09-10 ENCOUNTER — Encounter: Payer: Self-pay | Admitting: Internal Medicine

## 2014-09-10 ENCOUNTER — Ambulatory Visit (INDEPENDENT_AMBULATORY_CARE_PROVIDER_SITE_OTHER): Payer: 59 | Admitting: Internal Medicine

## 2014-09-10 VITALS — BP 112/62 | HR 57 | Temp 97.2°F | Resp 12 | Wt 187.4 lb

## 2014-09-10 DIAGNOSIS — IMO0002 Reserved for concepts with insufficient information to code with codable children: Secondary | ICD-10-CM

## 2014-09-10 DIAGNOSIS — E1165 Type 2 diabetes mellitus with hyperglycemia: Secondary | ICD-10-CM

## 2014-09-10 LAB — BASIC METABOLIC PANEL
BUN: 21 mg/dL (ref 6–23)
CO2: 29 mEq/L (ref 19–32)
Calcium: 10.2 mg/dL (ref 8.4–10.5)
Chloride: 104 mEq/L (ref 96–112)
Creatinine, Ser: 1.3 mg/dL (ref 0.40–1.50)
GFR: 60.17 mL/min (ref >60.00)
Glucose, Bld: 144 mg/dL — ABNORMAL HIGH (ref 70–99)
POTASSIUM: 4.4 meq/L (ref 3.5–5.1)
Sodium: 137 mEq/L (ref 135–145)

## 2014-09-10 NOTE — Progress Notes (Signed)
Patient ID: Luke West, male   DOB: 02-11-1956, 59 y.o.   MRN: [id number]  HPI: Luke West is a 59 y.o.-year-old male, returning for follow-up for DM2, dx in 2003, non-insulin-dependent, uncontrolled, with complications (CKD). Last visit a month ago.  Lost 5 lbs in last 2 months.  Last hemoglobin A1c was: Lab Results  Component Value Date   HGBA1C 7.8* 07/17/2014   HGBA1C 8.0* 12/05/2013   HGBA1C 7.1* 08/10/2013   Pt is on a regimen of: - Kombiglyze XR 2.10-998 mg - one at breakfast and one with dinner. - Invokana 100 mg daily in a.m. On JanuMet before.  Pt checks his sugars 2-4 a day and they are: - am: 128-190 (202) - alcohol >> 130-177 - 2h after b'fast: 124-163, 211 - before lunch: 123-153 - 2h after lunch: >> 123-168, 196 - before dinner: 110-144, 216 - 2h after dinner: 87-134 - bedtime: 119-170 >> 96-130, 181 - nighttime:n/c No lows. Lowest sugar was 120 >> 87;? if he has hypoglycemia awareness. Highest sugar was 200s >> 240.  Glucometer: One Touch  Pt's meals are: - Breakfast: 3 days a week, yoghurt  - Lunch: wide variety - Dinner: meat, veggies, strach - Snacks: 1+ - nuts; crackers + PB, chips and salsa  He exercises 5x a week.  - + CKD, last BUN/creatinine:  Lab Results  Component Value Date   BUN 23 07/17/2014   CREATININE 1.48 07/17/2014   - last set of lipids: Lab Results  Component Value Date   CHOL 173 07/17/2014   HDL 42.90 07/17/2014   LDLCALC 95 07/17/2014   TRIG 174.0* 07/17/2014   CHOLHDL 4 07/17/2014  On Lipitor - last eye exam was in 03/2014. Dr. Kathrin Penner.No DR.  - no numbness and tingling in his feet.  ROS: Constitutional: no weight gain/loss, no fatigue, no subjective hyperthermia/hypothermia Eyes: no blurry vision, no xerophthalmia ENT: no sore throat, no nodules palpated in throat, no dysphagia/odynophagia, no hoarseness Cardiovascular: no CP/SOB/palpitations/leg swelling Respiratory: no cough/SOB Gastrointestinal: no  N/V/D/C Musculoskeletal: no muscle/joint aches Skin: no rash; no easy bruising Neurological: no tremors/numbness/tingling/dizziness  I reviewed pt's medications, allergies, PMH, social hx, family hx, and changes were documented in the history of present illness. Otherwise, unchanged from my initial visit note:  Past Medical History  Diagnosis Date  . DM (diabetes mellitus)   . Raynaud phenomenon   . [name] syndrome 2008    Total bilirubin 1.4  . Hyperlipemia    Past Surgical History  Procedure Laterality Date  . Shoulder surgery      post dislocation sequellae  . Tonsillectomy and adenoidectomy    . Colonoscopy  2005 & 2010    negative X 2; Dr Watt Climes (due 2020)   History   Social History  . Marital Status: Married    Spouse Name: N/A  . Number of Children: 3   Occupational History  . Real estate development/construction   Social History Main Topics  . Smoking status: Former Smoker    Quit date: 06/28/1990  . Smokeless tobacco: Not on file     Comment: RSW:NIOEV 3-4 year . Smoked [phone] , up to 1-2 ppd  . Alcohol Use: 8.4 oz/week    14 Glasses of wine per week     Comment:  socially  . Drug Use: No   Current Outpatient Prescriptions on File Prior to Visit  Medication Sig Dispense Refill  . aspirin 81 MG tablet Take 160 mg by mouth daily.    Marland Kitchen atorvastatin (  LIPITOR) 20 MG tablet Take 1 tablet by mouth  daily 90 tablet 1  . canagliflozin (INVOKANA) 100 MG TABS tablet Take 1 tablet (100 mg total) by mouth daily. 30 tablet 2  . colchicine 0.6 MG tablet Take one by mouth daily as needed for gout 30 tablet 5  . glucose blood test strip 1 each by Other route 2 (two) times daily. Test blood sugar daily two times day 60 each 11  . KOMBIGLYZE XR 2.10-998 MG TB24 Take 1 tablet by mouth  twice daily 180 tablet 1  . Lactobacillus-Inulin (CULTURELLE DIGESTIVE HEALTH PO) Take 1 capsule by mouth daily.    . Multiple Vitamin (MULTIVITAMIN) tablet Take 1 tablet by mouth daily.      No current facility-administered medications on file prior to visit.   No Known Allergies Family History  Problem Relation Age of Onset  . Aneurysm Mother     cns  . Hyperlipidemia Mother   . Lung cancer Father     smoker  . Heart attack Sister 56  . Diabetes Brother     type 1  . Lung cancer Paternal Uncle     smoker  . Aneurysm Maternal Grandmother     cns;PRE 75 (mid 27s)  . Diabetes Maternal Grandfather     type 1  . Lung cancer Paternal Grandfather     smoker  . Aneurysm Paternal Grandmother     AAA  . Aneurysm Father     AAA   PE: BP 112/62 mmHg  Pulse 57  Temp(Src) 97.2 F (36.2 C) (Oral)  Resp 12  Wt 187 lb 6.4 oz (85.004 kg)  SpO2 96% Wt Readings from Last 3 Encounters:  09/10/14 187 lb 6.4 oz (85.004 kg)  08/09/14 189 lb 8 oz (85.957 kg)  07/23/14 192 lb 12 oz (87.431 kg)   Constitutional: slightly overweight, in NAD Eyes: PERRLA, EOMI, no exophthalmos ENT: moist mucous membranes, no thyromegaly, no cervical lymphadenopathy Cardiovascular: RRR, No MRG Respiratory: CTA B Gastrointestinal: abdomen soft, NT, ND, BS+ Musculoskeletal: no deformities, strength intact in all 4 Skin: moist, warm, no rashes except rosacea on face Neurological: no tremor with outstretched hands, DTR normal in all 4  ASSESSMENT: 1. DM2, non-insulin-dependent, uncontrolled, with complications - CKD  PLAN:  1. Patient with long-standing, uncontrolled diabetes, on oral antidiabetic regimen with Kombiglyza and Invokana, with improved control, except for the period when he was out of town - 2 weeks. Will continue current regimen and check a BMP. - We discussed about options for treatment, and I suggested to:  Patient Instructions  Please continue: - Kombiglyze XR 2.10-998 mg - 1 tablet in pm and 1 tablet with dinner -  Invokana 100 mg in am.  Please stop at the lab.  Please return in 2 months with your sugar log.   - Continue checking sugars at different times of the day  - check 2 times a day, rotating checks - advised for yearly eye exams >> he is UTD - Return to clinic in 2 mo with sugar log - check HbA1c then  Office Visit on 09/10/2014  Component Date Value Ref Range Status  . Sodium 09/10/2014 137  135 - 145 mEq/L Final  . Potassium 09/10/2014 4.4  3.5 - 5.1 mEq/L Final  . Chloride 09/10/2014 104  96 - 112 mEq/L Final  . CO2 09/10/2014 29  19 - 32 mEq/L Final  . Glucose, Bld 09/10/2014 144* 70 - 99 mg/dL Final  . BUN 09/10/2014 21  6 - 23 mg/dL Final  . Creatinine, Ser 09/10/2014 1.30  0.40 - 1.50 mg/dL Final  . Calcium 09/10/2014 10.2  8.4 - 10.5 mg/dL Final  . GFR 09/10/2014 60.17  >60.00 mL/min Final   GFR improved. K normal.

## 2014-09-10 NOTE — Patient Instructions (Signed)
Please continue: - Kombiglyze XR 2.10-998 mg - 1 tablet in pm and 1 tablet with dinner -  Invokana 100 mg in am.  Please stop at the lab.  Please return in 2 months with your sugar log.

## 2014-10-07 ENCOUNTER — Other Ambulatory Visit: Payer: Self-pay | Admitting: Internal Medicine

## 2014-11-04 ENCOUNTER — Other Ambulatory Visit: Payer: Self-pay | Admitting: Internal Medicine

## 2014-11-08 ENCOUNTER — Other Ambulatory Visit: Payer: Self-pay | Admitting: *Deleted

## 2014-11-08 MED ORDER — CANAGLIFLOZIN 100 MG PO TABS: 100.0000 mg | ORAL_TABLET | Freq: Every day | ORAL | Status: DC

## 2014-11-08 NOTE — Telephone Encounter (Signed)
Accidentally sent first one to [facility]. Resent to [facility].

## 2014-11-11 ENCOUNTER — Encounter: Payer: Self-pay | Admitting: Internal Medicine

## 2014-11-11 ENCOUNTER — Ambulatory Visit (INDEPENDENT_AMBULATORY_CARE_PROVIDER_SITE_OTHER): Payer: 59 | Admitting: Internal Medicine

## 2014-11-11 VITALS — BP 118/62 | HR 56 | Temp 98.2°F | Resp 12 | Wt 188.0 lb

## 2014-11-11 DIAGNOSIS — IMO0002 Reserved for concepts with insufficient information to code with codable children: Secondary | ICD-10-CM

## 2014-11-11 DIAGNOSIS — E1165 Type 2 diabetes mellitus with hyperglycemia: Secondary | ICD-10-CM

## 2014-11-11 LAB — HEMOGLOBIN A1C: HEMOGLOBIN A1C: 7.1 % — AB (ref 4.6–6.5)

## 2014-11-11 NOTE — Patient Instructions (Signed)
Please continue: - Kombiglyze XR 2.10-998 mg but move both tablets in am - Invokana 100 mg in am.  Please let me know about the sugars in ~3 weeks so we can see if we need Glipizide.  Please stop at the lab.  Please return in 2 months with your sugar log.

## 2014-11-11 NOTE — Progress Notes (Signed)
Patient ID: Luke West, male   DOB: [birth year], 59 y.o.   MRN: [id number]  HPI: Luke West is a 59 y.o.-year-old male, returning for follow-up for DM2, dx in 2003, non-insulin-dependent, uncontrolled, with complications (CKD). Last visit 2 mo ago.  Last hemoglobin A1c was: Lab Results  Component Value Date   HGBA1C 7.8* 07/17/2014   HGBA1C 8.0* 12/05/2013   HGBA1C 7.1* 08/10/2013   Pt is on a regimen of: - Kombiglyze XR 2.10-998 mg - one at breakfast and one with dinner. - Invokana 100 mg daily in a.m. On JanuMet before.  Pt checks his sugars 2-4 a day and they are: - am: 128-190 (202) - alcohol >> 130-177 >> 116-182 - 2h after b'fast: 124-163, 211 >> 127-234 (200s if forgets meds) - before lunch: 123-153 >> 134-171 - 2h after lunch: >> 123-168, 196 >> 100-175 - before dinner: 110-144, 216 >> 126-180, 211 - 2h after dinner: 87-134 >> 130-181 - bedtime: 119-170 >> 96-130, 181 >> 127-171 - nighttime:n/c No lows. Lowest sugar was 120 >> 87 >> 87;? if he has hypoglycemia awareness. Highest sugar was 200s >> 240 >> 247.  Glucometer: One Touch  Pt's meals are: - Breakfast: 3 days a week, yoghurt  - Lunch: wide variety - Dinner: meat, veggies, strach - Snacks: 1+ - nuts; crackers + PB, chips and salsa  He exercises 5x a week.  - + CKD, last BUN/creatinine:  Lab Results  Component Value Date   BUN 21 09/10/2014   CREATININE 1.30 09/10/2014   - last set of lipids: Lab Results  Component Value Date   CHOL 173 07/17/2014   HDL 42.90 07/17/2014   LDLCALC 95 07/17/2014   TRIG 174.0* 07/17/2014   CHOLHDL 4 07/17/2014  On Lipitor - last eye exam was in 03/2014. Dr. Kathrin Penner.No DR.  - no numbness and tingling in his feet.  ROS: Constitutional: no weight gain/loss, no fatigue, no subjective hyperthermia/hypothermia Eyes: no blurry vision, no xerophthalmia ENT: no sore throat, no nodules palpated in throat, no dysphagia/odynophagia, no hoarseness Cardiovascular:  no CP/SOB/palpitations/leg swelling Respiratory: no cough/SOB Gastrointestinal: no N/V/D/C Musculoskeletal: no muscle/joint aches Skin: no rash; no easy bruising Neurological: no tremors/numbness/tingling/dizziness  I reviewed pt's medications, allergies, PMH, social hx, family hx, and changes were documented in the history of present illness. Otherwise, unchanged from my initial visit note:  Past Medical History  Diagnosis Date  . DM (diabetes mellitus)   . Raynaud phenomenon   . [name] syndrome 2008    Total bilirubin 1.4  . Hyperlipemia    Past Surgical History  Procedure Laterality Date  . Shoulder surgery      post dislocation sequellae  . Tonsillectomy and adenoidectomy    . Colonoscopy  2005 & 2010    negative X 2; Dr Watt Climes (due 2020)   History   Social History  . Marital Status: Married    Spouse Name: N/A  . Number of Children: 3   Occupational History  . Real estate development/construction   Social History Main Topics  . Smoking status: Former Smoker    Quit date: 06/28/1990  . Smokeless tobacco: Not on file     Comment: OBS:JGGEZ 3-4 year . Smoked [phone] , up to 1-2 ppd  . Alcohol Use: 8.4 oz/week    14 Glasses of wine per week     Comment:  socially  . Drug Use: No   Current Outpatient Prescriptions on File Prior to Visit  Medication Sig Dispense Refill  .  aspirin 81 MG tablet Take 160 mg by mouth daily.    Marland Kitchen atorvastatin (LIPITOR) 20 MG tablet Take 1 tablet by mouth  daily 90 tablet 0  . canagliflozin (INVOKANA) 100 MG TABS tablet Take 1 tablet (100 mg total) by mouth daily. 30 tablet 1  . colchicine 0.6 MG tablet Take one by mouth daily as needed for gout 30 tablet 5  . glucose blood test strip 1 each by Other route 2 (two) times daily. Test blood sugar daily two times day 60 each 11  . KOMBIGLYZE XR 2.10-998 MG TB24 Take 1 tablet by mouth  twice a day 180 tablet 1  . Lactobacillus-Inulin (CULTURELLE DIGESTIVE HEALTH PO) Take 1 capsule by mouth  daily.    . Multiple Vitamin (MULTIVITAMIN) tablet Take 1 tablet by mouth daily.     No current facility-administered medications on file prior to visit.   No Known Allergies Family History  Problem Relation Age of Onset  . Aneurysm Mother     cns  . Hyperlipidemia Mother   . Lung cancer Father     smoker  . Heart attack Sister 9  . Diabetes Brother     type 1  . Lung cancer Paternal Uncle     smoker  . Aneurysm Maternal Grandmother     cns;PRE 22 (mid 71s)  . Diabetes Maternal Grandfather     type 1  . Lung cancer Paternal Grandfather     smoker  . Aneurysm Paternal Grandmother     AAA  . Aneurysm Father     AAA   PE: BP 118/62 mmHg  Pulse 56  Temp(Src) 98.2 F (36.8 C) (Oral)  Resp 12  Wt 188 lb (85.276 kg)  SpO2 95% Body mass index is 26.79 kg/(m^2). Wt Readings from Last 3 Encounters:  11/11/14 188 lb (85.276 kg)  09/10/14 187 lb 6.4 oz (85.004 kg)  08/09/14 189 lb 8 oz (85.957 kg)   Constitutional: slightly overweight, in NAD Eyes: PERRLA, EOMI, no exophthalmos ENT: moist mucous membranes, no thyromegaly, no cervical lymphadenopathy Cardiovascular: RRR, No MRG Respiratory: CTA B Gastrointestinal: abdomen soft, NT, ND, BS+ Musculoskeletal: no deformities, strength intact in all 4 Skin: moist, warm, no rashes except rosacea on face Neurological: no tremor with outstretched hands, DTR normal in all 4  ASSESSMENT: 1. DM2, non-insulin-dependent, uncontrolled, with complications - CKD  PLAN:  1. Patient with long-standing, uncontrolled diabetes, on oral antidiabetic regimen with Kombiglyza and Invokana, with still slightly higher sugars - only higher when he was out of town and he forgot his meds. Sugars are higher then target in am and occasionally after exercise. Will move all Kombiglyze in am and add Glipizide XL if this is not enough or he cannot tolerate the dose. At next visit, we can switch to Onglyza and Metformin and take Metformin at night. - We  discussed about options for treatment, and I suggested to:  Patient Instructions  Please continue: - Kombiglyze XR 2.10-998 mg but move both tablets in am - Invokana 100 mg in am.  Please let me know about the sugars in ~3 weeks so we can see if we need Glipizide.  Please stop at the lab.  Please return in 2 months with your sugar log.   - Continue checking sugars at different times of the day - check 2 times a day, rotating checks - advised for yearly eye exams >> he is UTD - check HbA1c today - Return to clinic in 2 mo with sugar  log   Office Visit on 11/11/2014  Component Date Value Ref Range Status  . Hgb A1c MFr Bld 11/11/2014 7.1* 4.6 - 6.5 % Final   Glycemic Control Guidelines for People with Diabetes:Non Diabetic:  <6%Goal of Therapy: <7%Additional Action Suggested:  >8%    Hemoglobin A1c improved!

## 2014-11-27 ENCOUNTER — Encounter: Payer: Self-pay | Admitting: Internal Medicine

## 2014-12-09 ENCOUNTER — Other Ambulatory Visit: Payer: Self-pay | Admitting: Internal Medicine

## 2015-01-13 ENCOUNTER — Other Ambulatory Visit: Payer: Self-pay | Admitting: Internal Medicine

## 2015-01-13 ENCOUNTER — Ambulatory Visit (INDEPENDENT_AMBULATORY_CARE_PROVIDER_SITE_OTHER): Payer: 59 | Admitting: Internal Medicine

## 2015-01-13 ENCOUNTER — Encounter: Payer: Self-pay | Admitting: Internal Medicine

## 2015-01-13 VITALS — BP 112/60 | HR 59 | Temp 97.8°F | Resp 12 | Wt 182.0 lb

## 2015-01-13 DIAGNOSIS — IMO0002 Reserved for concepts with insufficient information to code with codable children: Secondary | ICD-10-CM

## 2015-01-13 DIAGNOSIS — E1165 Type 2 diabetes mellitus with hyperglycemia: Secondary | ICD-10-CM

## 2015-01-13 MED ORDER — METFORMIN HCL ER (OSM) 1000 MG PO TB24: 2000.0000 mg | ORAL_TABLET | Freq: Every day | ORAL | Status: DC

## 2015-01-13 MED ORDER — SAXAGLIPTIN HCL 5 MG PO TABS: 5.0000 mg | ORAL_TABLET | Freq: Every day | ORAL | Status: DC

## 2015-01-13 NOTE — Progress Notes (Signed)
Patient ID: Luke West, male   DOB: [birth year], 59 y.o.   MRN: [id number]  HPI: Luke West is a 59 y.o.-year-old male, returning for follow-up for DM2, dx in 2003, non-insulin-dependent, uncontrolled, with complications (CKD). Last visit 2 mo ago.  Last hemoglobin A1c was: Lab Results  Component Value Date   HGBA1C 7.1* 11/11/2014   HGBA1C 7.8* 07/17/2014   HGBA1C 8.0* 12/05/2013   Pt is on a regimen of: - Kombiglyze XR 2.10-998 mg - now all in am - Invokana 100 mg daily in a.m. >> no SEs On JanuMet before.  Pt checks his sugars 2-4 a day and they are better after moving Kombiglyze in am: - am: 128-190 (202) - alcohol >> 130-177 >> 116-182 >> 137-166 - 2h after b'fast: 124-163, 211 >> 127-234 (200s if forgets meds) >> 126-176 - before lunch: 123-153 >> 134-171 >> 120-179 - 2h after lunch: >> 123-168, 196 >> 100-175 >> 94-161 - before dinner: 110-144, 216 >> 126-180, 211 >> 125-157 - 2h after dinner: 87-134 >> 130-181 >> 144-193 - bedtime: 119-170 >> 96-130, 181 >> 127-171 >> 131-175 - nighttime:n/c No lows. Lowest sugar was 120 >> 87 >> 87 >> 95;? if he has hypoglycemia awareness. Highest sugar was 200s >> 240 >> 247 >> 190s.  Glucometer: One Touch  Pt's meals are: - Breakfast: 3 days a week, yoghurt  - Lunch: wide variety - Dinner: meat, veggies, strach - Snacks: 1+ - nuts; crackers + PB, chips and salsa  He exercises 5x a week.  - + CKD, last BUN/creatinine:  Lab Results  Component Value Date   BUN 21 09/10/2014   CREATININE 1.30 09/10/2014   - last set of lipids: Lab Results  Component Value Date   CHOL 173 07/17/2014   HDL 42.90 07/17/2014   LDLCALC 95 07/17/2014   TRIG 174.0* 07/17/2014   CHOLHDL 4 07/17/2014  On Lipitor - last eye exam was in 03/2014. Dr. Kathrin Penner.No DR.  - no numbness and tingling in his feet.  ROS: Constitutional: no weight gain/loss, no fatigue, no subjective hyperthermia/hypothermia Eyes: no blurry vision, no  xerophthalmia ENT: no sore throat, no nodules palpated in throat, no dysphagia/odynophagia, no hoarseness Cardiovascular: no CP/SOB/palpitations/leg swelling Respiratory: no cough/SOB Gastrointestinal: no N/V/D/C Musculoskeletal: no muscle/joint aches Skin: no rash; no easy bruising Neurological: no tremors/numbness/tingling/dizziness  I reviewed pt's medications, allergies, PMH, social hx, family hx, and changes were documented in the history of present illness. Otherwise, unchanged from my initial visit note:  Past Medical History  Diagnosis Date  . DM (diabetes mellitus)   . Raynaud phenomenon   . [name] syndrome 2008    Total bilirubin 1.4  . Hyperlipemia    Past Surgical History  Procedure Laterality Date  . Shoulder surgery      post dislocation sequellae  . Tonsillectomy and adenoidectomy    . Colonoscopy  2005 & 2010    negative X 2; Dr Watt Climes (due 2020)   History   Social History  . Marital Status: Married    Spouse Name: N/A  . Number of Children: 3   Occupational History  . Real estate development/construction   Social History Main Topics  . Smoking status: Former Smoker    Quit date: 06/28/1990  . Smokeless tobacco: Not on file     Comment: WUJ:WJXBJ 3-4 year . Smoked [phone] , up to 1-2 ppd  . Alcohol Use: 8.4 oz/week    14 Glasses of wine per week     Comment:  socially  . Drug Use: No   Current Outpatient Prescriptions on File Prior to Visit  Medication Sig Dispense Refill  . aspirin 81 MG tablet Take 160 mg by mouth daily.    Marland Kitchen atorvastatin (LIPITOR) 20 MG tablet Take 1 tablet by mouth  daily 90 tablet 0  . canagliflozin (INVOKANA) 100 MG TABS tablet Take 1 tablet (100 mg total) by mouth daily. 30 tablet 1  . colchicine 0.6 MG tablet Take one by mouth daily as needed for gout 30 tablet 5  . glucose blood test strip 1 each by Other route 2 (two) times daily. Test blood sugar daily two times day 60 each 11  . INVOKANA 100 MG TABS tablet Take 1  tablet by mouth  daily 90 tablet 1  . KOMBIGLYZE XR 2.10-998 MG TB24 Take 1 tablet by mouth  twice a day 180 tablet 1  . Lactobacillus-Inulin (CULTURELLE DIGESTIVE HEALTH PO) Take 1 capsule by mouth daily.    . Multiple Vitamin (MULTIVITAMIN) tablet Take 1 tablet by mouth daily.     No current facility-administered medications on file prior to visit.   No Known Allergies Family History  Problem Relation Age of Onset  . Aneurysm Mother     cns  . Hyperlipidemia Mother   . Lung cancer Father     smoker  . Heart attack Sister 66  . Diabetes Brother     type 1  . Lung cancer Paternal Uncle     smoker  . Aneurysm Maternal Grandmother     cns;PRE 50 (mid 84s)  . Diabetes Maternal Grandfather     type 1  . Lung cancer Paternal Grandfather     smoker  . Aneurysm Paternal Grandmother     AAA  . Aneurysm Father     AAA   PE: BP 112/60 mmHg  Pulse 59  Temp(Src) 97.8 F (36.6 C) (Oral)  Resp 12  Wt 182 lb (82.555 kg)  SpO2 95% Body mass index is 25.94 kg/(m^2). Wt Readings from Last 3 Encounters:  01/13/15 182 lb (82.555 kg)  11/11/14 188 lb (85.276 kg)  09/10/14 187 lb 6.4 oz (85.004 kg)   Constitutional: slightly overweight, in NAD Eyes: PERRLA, EOMI, no exophthalmos ENT: moist mucous membranes, no thyromegaly, no cervical lymphadenopathy Cardiovascular: RRR, No MRG Respiratory: CTA B Gastrointestinal: abdomen soft, NT, ND, BS+ Musculoskeletal: no deformities, strength intact in all 4 Skin: moist, warm, no rashes except rosacea on face Neurological: no tremor with outstretched hands, DTR normal in all 4  ASSESSMENT: 1. DM2, non-insulin-dependent, uncontrolled, with complications - CKD  PLAN:  1. Patient with long-standing, uncontrolled diabetes, on oral antidiabetic regimen with Kombiglyza and Invokana, with improved sugars after moving all Kombiglyze in am. Since sugars in am still higher than target >> will switch to Onglyza and Metformin and take Metformin at  night. - I suggested to:  Patient Instructions  Please stop: - Kombiglyze XR 2.10-998 mg in am  Continue: - Invokana 100 mg in am.  Start: - Onglyza 5 mg in am - Metformin XR 2000 mg with dinner  Please return in 3 months with your sugar log.   - Continue checking sugars at different times of the day - check 2 times a day, rotating checks - advised for yearly eye exams >> he is UTD - check HbA1c at next visit - Return to clinic in 3 mo with sugar log

## 2015-01-13 NOTE — Patient Instructions (Addendum)
Please stop: - Kombiglyze XR 2.10-998 mg in am  Continue: - Invokana 100 mg in am.  Start: - Onglyza 5 mg in am - Metformin XR 2000 mg with dinner  Please return in 3 months with your sugar log.

## 2015-01-16 ENCOUNTER — Other Ambulatory Visit: Payer: Self-pay | Admitting: Emergency Medicine

## 2015-01-16 MED ORDER — ATORVASTATIN CALCIUM 20 MG PO TABS: ORAL_TABLET | ORAL | Status: DC

## 2015-01-17 ENCOUNTER — Other Ambulatory Visit: Payer: Self-pay | Admitting: *Deleted

## 2015-01-17 DIAGNOSIS — IMO0002 Reserved for concepts with insufficient information to code with codable children: Secondary | ICD-10-CM

## 2015-01-17 DIAGNOSIS — E1165 Type 2 diabetes mellitus with hyperglycemia: Secondary | ICD-10-CM

## 2015-01-21 ENCOUNTER — Other Ambulatory Visit: Payer: Self-pay | Admitting: *Deleted

## 2015-01-21 MED ORDER — METFORMIN HCL ER 500 MG PO TB24: 2000.0000 mg | ORAL_TABLET | Freq: Every day | ORAL | Status: DC

## 2015-02-11 ENCOUNTER — Other Ambulatory Visit: Payer: Self-pay | Admitting: *Deleted

## 2015-02-11 MED ORDER — GLUCOSE BLOOD VI STRP: ORAL_STRIP | Status: DC

## 2015-03-11 ENCOUNTER — Ambulatory Visit (INDEPENDENT_AMBULATORY_CARE_PROVIDER_SITE_OTHER): Payer: 59 | Admitting: Internal Medicine

## 2015-03-11 ENCOUNTER — Encounter: Payer: Self-pay | Admitting: Internal Medicine

## 2015-03-11 VITALS — BP 120/78 | HR 52 | Temp 98.1°F | Resp 16 | Ht 70.0 in | Wt 183.0 lb

## 2015-03-11 DIAGNOSIS — Z0000 Encounter for general adult medical examination without abnormal findings: Secondary | ICD-10-CM

## 2015-03-11 DIAGNOSIS — Z23 Encounter for immunization: Secondary | ICD-10-CM | POA: Diagnosis not present

## 2015-03-11 NOTE — Progress Notes (Signed)
   Subjective:    Patient ID: Luke West, male    DOB: 1955/09/13, 59 y.o.   MRN: [id number]  HPI He is here for a physical;acute issues include loose stool. This has been responsive to a probiotic. His Endocrinologist has adjusted his diabetic medicines in an attempt to address this.  He has been compliant with his medicines. He does restrict salt. He injests some red meats. He restricts fried foods. He exercises 45-75 minutes 5 days a week without cardio pulmonary symptoms.  His glucoses range from 120-200, the latter is very rare. Usually glucoses are 150-170. His most recent A1c was 7.1% on 11/11/14. He has not seen a Podiatrist; sensation is checked by his Endocrinologist.  His urology evaluation is up-to-date. PSA was 0.82 on 01/29/15.  Additionally colonoscopy is up-to-date; Dr. Watt Climes has recommended follow-up in 2020.  Review of Systems   He notes some delayed wound healing; but this is a chronic issue.  He's had a history of gout but no acute episodes for years. He has as needed medication to take for this.  Chest pain, palpitations, tachycardia, exertional dyspnea, paroxysmal nocturnal dyspnea, claudication or edema are absent. No unexplained weight loss, abdominal pain, significant dyspepsia, dysphagia, melena, rectal bleeding, or persistently small caliber stools. Dysuria, pyuria, hematuria, frequency, nocturia or polyuria are denied. Change in hair, skin, nails denied. No bowel changes of constipation or diarrhea. No intolerance to heat or cold.     Objective:   Physical Exam Pertinent or positive findings include: Alopecia is present. He has minor pterygia medially. Heart rate is slow. He has slight crepitus of the knees. Genitourinary exam is deferred to his Urologist.  General appearance :adequately nourished; in no distress.  Eyes: No conjunctival inflammation or scleral icterus is present.  Oral exam:  Lips and gums are healthy appearing.There is no oropharyngeal  erythema or exudate noted. Dental hygiene is good.  Heart:  Normal rate and regular rhythm. S1 and S2 normal without gallop, murmur, click, rub or other extra sounds    Lungs:Chest clear to auscultation; no wheezes, rhonchi,rales ,or rubs present.No increased work of breathing.   Abdomen: bowel sounds normal, soft and non-tender without masses, organomegaly or hernias noted.  No guarding or rebound.   Vascular : all pulses equal ; no bruits present.  Skin:Warm & dry.  Intact without suspicious lesions or rashes ; no tenting or jaundice   Lymphatic: No lymphadenopathy is noted about the head, neck, axilla.   Neuro: Strength, tone & DTRs normal.     Assessment & Plan:  #1 comprehensive physical exam; no acute findings  Plan: see Orders  & Recommendations

## 2015-03-11 NOTE — Progress Notes (Signed)
Pre visit review using our clinic review tool, if applicable. No additional management support is needed unless otherwise documented below in the visit note. 

## 2015-03-11 NOTE — Patient Instructions (Signed)
  Your next office appointment will be determined based upon review of your pending labs. Those written interpretation of the lab results and instructions will be transmitted to you by My Chart  Critical results will be called.   Followup as needed for any active or acute issue. Please report any significant change in your symptoms. 

## 2015-03-12 ENCOUNTER — Other Ambulatory Visit (INDEPENDENT_AMBULATORY_CARE_PROVIDER_SITE_OTHER): Payer: 59

## 2015-03-12 DIAGNOSIS — Z0189 Encounter for other specified special examinations: Secondary | ICD-10-CM | POA: Diagnosis not present

## 2015-03-12 DIAGNOSIS — Z0000 Encounter for general adult medical examination without abnormal findings: Secondary | ICD-10-CM

## 2015-03-12 LAB — BASIC METABOLIC PANEL
BUN: 23 mg/dL (ref 6–23)
CALCIUM: 10 mg/dL (ref 8.4–10.5)
CO2: 25 mEq/L (ref 19–32)
CREATININE: 1.18 mg/dL (ref 0.40–1.50)
Chloride: 102 mEq/L (ref 96–112)
GFR: 67.17 mL/min (ref >60.00)
Glucose, Bld: 179 mg/dL — ABNORMAL HIGH (ref 70–99)
Potassium: 5.5 mEq/L — ABNORMAL HIGH (ref 3.5–5.1)
Sodium: 138 mEq/L (ref 135–145)

## 2015-03-12 LAB — CBC WITH DIFFERENTIAL/PLATELET
BASOS ABS: 0 10*3/uL (ref 0.0–0.1)
Basophils Relative: 0.3 % (ref 0.0–3.0)
EOS ABS: 0.1 10*3/uL (ref 0.0–0.7)
Eosinophils Relative: 1.4 % (ref 0.0–5.0)
HEMATOCRIT: 44.8 % (ref 39.0–52.0)
HEMOGLOBIN: 15.2 g/dL (ref 13.0–17.0)
LYMPHS PCT: 13.1 % (ref 12.0–46.0)
Lymphs Abs: 0.7 10*3/uL (ref 0.7–4.0)
MCHC: 34 g/dL (ref 30.0–36.0)
MCV: 94.6 fl (ref 78.0–100.0)
Monocytes Absolute: 0.4 10*3/uL (ref 0.1–1.0)
Monocytes Relative: 7.7 % (ref 3.0–12.0)
NEUTROS ABS: 4.1 10*3/uL (ref 1.4–7.7)
Neutrophils Relative %: 77.5 % — ABNORMAL HIGH (ref 43.0–77.0)
PLATELETS: 141 10*3/uL — AB (ref 150.0–400.0)
RBC: 4.73 Mil/uL (ref 4.22–5.81)
RDW: 12.4 % (ref 11.5–15.5)
WBC: 5.2 10*3/uL (ref 4.0–10.5)

## 2015-03-12 LAB — HEPATIC FUNCTION PANEL
ALK PHOS: 63 U/L (ref 39–117)
ALT: 11 U/L (ref 0–53)
AST: 16 U/L (ref 0–37)
Albumin: 4.5 g/dL (ref 3.5–5.2)
BILIRUBIN DIRECT: 0.2 mg/dL (ref 0.0–0.3)
TOTAL PROTEIN: 7.1 g/dL (ref 6.0–8.3)
Total Bilirubin: 0.8 mg/dL (ref 0.2–1.2)

## 2015-03-12 LAB — URIC ACID: Uric Acid, Serum: 5.3 mg/dL (ref 4.0–7.8)

## 2015-03-12 LAB — LIPID PANEL
CHOL/HDL RATIO: 3
Cholesterol: 181 mg/dL (ref 0–200)
HDL: 52.1 mg/dL (ref >39.00)
LDL Cholesterol: 99 mg/dL (ref 0–99)
NONHDL: 128.54
Triglycerides: 147 mg/dL (ref 0.0–149.0)
VLDL: 29.4 mg/dL (ref 0.0–40.0)

## 2015-03-12 LAB — TSH: TSH: 2.13 u[IU]/mL (ref 0.35–4.50)

## 2015-03-28 LAB — HM DIABETES EYE EXAM

## 2015-04-01 ENCOUNTER — Telehealth: Payer: Self-pay | Admitting: Internal Medicine

## 2015-04-01 NOTE — Telephone Encounter (Signed)
Received medical records from [facility]. Sent to Dr. Linna Darner. 04/01/15/ss

## 2015-04-14 ENCOUNTER — Ambulatory Visit: Payer: 59 | Admitting: Internal Medicine

## 2015-04-15 ENCOUNTER — Encounter: Payer: Self-pay | Admitting: Internal Medicine

## 2015-04-15 ENCOUNTER — Ambulatory Visit (INDEPENDENT_AMBULATORY_CARE_PROVIDER_SITE_OTHER): Payer: 59 | Admitting: Internal Medicine

## 2015-04-15 ENCOUNTER — Other Ambulatory Visit (INDEPENDENT_AMBULATORY_CARE_PROVIDER_SITE_OTHER): Payer: 59 | Admitting: *Deleted

## 2015-04-15 DIAGNOSIS — E119 Type 2 diabetes mellitus without complications: Secondary | ICD-10-CM | POA: Insufficient documentation

## 2015-04-15 DIAGNOSIS — E1121 Type 2 diabetes mellitus with diabetic nephropathy: Secondary | ICD-10-CM | POA: Diagnosis not present

## 2015-04-15 DIAGNOSIS — E1169 Type 2 diabetes mellitus with other specified complication: Secondary | ICD-10-CM | POA: Insufficient documentation

## 2015-04-15 LAB — POCT GLYCOSYLATED HEMOGLOBIN (HGB A1C): Hemoglobin A1C: 6.9

## 2015-04-15 MED ORDER — GLIPIZIDE ER 5 MG PO TB24: 5.0000 mg | ORAL_TABLET | Freq: Every day | ORAL | Status: DC

## 2015-04-15 NOTE — Progress Notes (Signed)
Patient ID: Luke West, male   DOB: 1955-11-21, 59 y.o.   MRN: [id number]  HPI: Luke West is a 59 y.o.-year-old male, returning for follow-up for DM2, dx in 2003, non-insulin-dependent, uncontrolled, with complications (CKD). Last visit 3 mo ago.  Since last visit, he gained weight and his sugars are worse, as he relaxes diet, drinking more alcohol and was traveling more.  Last hemoglobin A1c was: Lab Results  Component Value Date   HGBA1C 7.1* 11/11/2014   HGBA1C 7.8* 07/17/2014   HGBA1C 8.0* 12/05/2013   Pt is on a regimen of: - Invokana 100 mg in am. - Onglyza 5 mg in am - Metformin XR 2000 mg with dinner Previously on JanuMet and Kombiglyze.  Pt checks his sugars 2-4 a day and they are worse: - am: 128-190 (202) - alcohol >> 130-177 >> 116-182 >> 137-166 >> 123-184 - 2h after b'fast: 124-163, 211 >> 127-234 (200s if forgets meds) >> 126-176 >> 150, 167-283 - before lunch: 123-153 >> 134-171 >> 120-179 >> 142 - 2h after lunch: >> 123-168, 196 >> 100-175 >> 94-161 >> 128-257 - before dinner: 110-144, 216 >> 126-180, 211 >> 125-157 >> 140-289 - 2h after dinner: 87-134 >> 130-181 >> 144-193 >> 197 - bedtime: 119-170 >> 96-130, 181 >> 127-171 >> 131-175 >> 162-243 - nighttime:n/c No lows. Lowest sugar was 120 >> 87 >> 87 >> 95;? if he has hypoglycemia awareness. Highest sugar was 200s >> 240 >> 247 >> 190s.  Glucometer: One Touch Ultra Mini  Pt's meals are: - Breakfast: 3 days a week, yoghurt  - Lunch: wide variety - Dinner: meat, veggies, strach - Snacks: 1+ - nuts; crackers + PB, chips and salsa  He exercises 5x a week.  - + CKD, last BUN/creatinine:  Lab Results  Component Value Date   BUN 23 03/12/2015   CREATININE 1.18 03/12/2015   - last set of lipids: Lab Results  Component Value Date   CHOL 181 03/12/2015   HDL 52.10 03/12/2015   LDLCALC 99 03/12/2015   TRIG 147.0 03/12/2015   CHOLHDL 3 03/12/2015  On Lipitor - last eye exam was in 03/28/2015.  Dr. Kathrin Penner.No DR.  - no numbness and tingling in his feet.  ROS: Constitutional: no weight gain/loss, no fatigue, no subjective hyperthermia/hypothermia, + poor sleep Eyes: no blurry vision, no xerophthalmia ENT: no sore throat, no nodules palpated in throat, no dysphagia/odynophagia, no hoarseness, + decreased hearing Cardiovascular: no CP/SOB/palpitations/leg swelling Respiratory: no cough/SOB Gastrointestinal: no N/V/+ D/no C Musculoskeletal: no muscle/joint aches Skin: no rash; no easy bruising, + hair loss Neurological: no tremors/numbness/tingling/dizziness  I reviewed pt's medications, allergies, PMH, social hx, family hx, and changes were documented in the history of present illness. Otherwise, unchanged from my initial visit note:  Past Medical History  Diagnosis Date  . DM (diabetes mellitus)   . Raynaud phenomenon   . [name] syndrome 2008    Total bilirubin 1.4  . Hyperlipemia    Past Surgical History  Procedure Laterality Date  . Shoulder surgery      post dislocation sequellae  . Tonsillectomy and adenoidectomy    . Colonoscopy  2005 & 2010    negative X 2; Dr Watt Climes (due 2020)   History   Social History  . Marital Status: Married    Spouse Name: N/A  . Number of Children: 3   Occupational History  . Real estate development/construction   Social History Main Topics  . Smoking status: Former Smoker  Quit date: 06/28/1990  . Smokeless tobacco: Not on file     Comment: NWG:NFAOZ 3-4 year . Smoked [phone] , up to 1-2 ppd  . Alcohol Use: 8.4 oz/week    14 Glasses of wine per week     Comment:  socially  . Drug Use: No   Current Outpatient Prescriptions on File Prior to Visit  Medication Sig Dispense Refill  . aspirin 81 MG tablet Take 160 mg by mouth daily.    Marland Kitchen atorvastatin (LIPITOR) 20 MG tablet Take 1 tablet by mouth  daily 90 tablet 0  . canagliflozin (INVOKANA) 100 MG TABS tablet Take 1 tablet (100 mg total) by mouth daily. 30 tablet 1  .  colchicine 0.6 MG tablet Take one by mouth daily as needed for gout 30 tablet 5  . glucose blood (ONE TOUCH ULTRA TEST) test strip Use to test blood sugar 2 times daily as instructed. Dx:E11.65 100 each 5  . Lactobacillus-Inulin (CULTURELLE DIGESTIVE HEALTH PO) Take 1 capsule by mouth daily.    . metFORMIN (GLUCOPHAGE XR) 500 MG 24 hr tablet Take 4 tablets (2,000 mg total) by mouth daily with supper. 120 tablet 2  . Multiple Vitamin (MULTIVITAMIN) tablet Take 1 tablet by mouth daily.    . saxagliptin HCl (ONGLYZA) 5 MG TABS tablet Take 1 tablet (5 mg total) by mouth daily. 90 tablet 1   No current facility-administered medications on file prior to visit.   No Known Allergies Family History  Problem Relation Age of Onset  . Aneurysm Mother     cns  . Hyperlipidemia Mother   . Lung cancer Father     smoker  . Heart attack Sister 18  . Diabetes Brother     type 1  . Lung cancer Paternal Uncle     smoker  . Aneurysm Maternal Grandmother     cns;PRE 81 (mid 65s)  . Diabetes Maternal Grandfather     type 1  . Lung cancer Paternal Grandfather     smoker  . Aneurysm Paternal Grandmother     AAA  . Aneurysm Father     AAA   PE: BP 128/78 mmHg  Pulse 56  Temp(Src) 97.6 F (36.4 C) (Oral)  Resp 12  Wt 191 lb 3.2 oz (86.728 kg)  SpO2 96% Body mass index is 27.43 kg/(m^2). Wt Readings from Last 3 Encounters:  04/15/15 191 lb 3.2 oz (86.728 kg)  03/11/15 183 lb (83.008 kg)  01/13/15 182 lb (82.555 kg)   Constitutional: slightly overweight, in NAD Eyes: PERRLA, EOMI, no exophthalmos ENT: moist mucous membranes, no thyromegaly, no cervical lymphadenopathy Cardiovascular: RRR, No MRG Respiratory: CTA B Gastrointestinal: abdomen soft, NT, ND, BS+ Musculoskeletal: no deformities, strength intact in all 4 Skin: moist, warm, no rashes except rosacea on face Neurological: no tremor with outstretched hands, DTR normal in all 4  ASSESSMENT: 1. DM2, non-insulin-dependent,  uncontrolled, with complications - CKD  PLAN:  1. Patient with long-standing, uncontrolled diabetes, on oral antidiabetic regimen, with worse sugars over the summer. He also gained weight as he relaxed his diet. We will add glipizide extended-release in a.m. Since he complains of diarrhea in the morning, I advised him to take metformin with a less fatty meal, but he can also split the metformin between 2 doses: With lunch and with dinner. - At next visit, we may need to switch from Onglyza to a GLP-1 receptor agonist - I suggested to:  Patient Instructions  Please Continue: - Invokana 100 mg in  am. - Onglyza 5 mg in am - Metformin XR 2000 mg with dinner  Please return in 3 months with your sugar log.   - Continue checking sugars at different times of the day - check 2 times a day, rotating checks - advised for yearly eye exams >> he is UTD - check HbA1c today >> 6.9% (improved!). This is surprising, since his sugars are worse. At next visit, we may need to check a fructosamine. - He had the flu shot this season  - Return to clinic in 3 mo with sugar log

## 2015-04-15 NOTE — Patient Instructions (Signed)
Please try to split Metformin XR in 2 doses: lunch and dinner. Continue:  - Invokana 100 mg in am. - Onglyza 5 mg in am Please add: - Glipizide XL 5 mg in am, before b'fast.  Please come back for a follow-up appointment in 3 months.

## 2015-04-21 ENCOUNTER — Other Ambulatory Visit: Payer: Self-pay | Admitting: Internal Medicine

## 2015-04-25 ENCOUNTER — Encounter: Payer: Self-pay | Admitting: Internal Medicine

## 2015-05-02 ENCOUNTER — Other Ambulatory Visit: Payer: Self-pay | Admitting: *Deleted

## 2015-05-02 MED ORDER — METFORMIN HCL ER 500 MG PO TB24: 2000.0000 mg | ORAL_TABLET | Freq: Every day | ORAL | Status: DC

## 2015-05-09 ENCOUNTER — Other Ambulatory Visit: Payer: Self-pay | Admitting: *Deleted

## 2015-05-09 MED ORDER — METFORMIN HCL ER 500 MG PO TB24: 2000.0000 mg | ORAL_TABLET | Freq: Every day | ORAL | Status: DC

## 2015-06-11 ENCOUNTER — Encounter: Payer: Self-pay | Admitting: Internal Medicine

## 2015-06-12 ENCOUNTER — Other Ambulatory Visit: Payer: Self-pay | Admitting: Internal Medicine

## 2015-06-12 ENCOUNTER — Encounter: Payer: Self-pay | Admitting: Internal Medicine

## 2015-06-12 DIAGNOSIS — Z8249 Family history of ischemic heart disease and other diseases of the circulatory system: Secondary | ICD-10-CM

## 2015-06-12 DIAGNOSIS — E1121 Type 2 diabetes mellitus with diabetic nephropathy: Secondary | ICD-10-CM

## 2015-06-12 DIAGNOSIS — E782 Mixed hyperlipidemia: Secondary | ICD-10-CM

## 2015-07-07 ENCOUNTER — Other Ambulatory Visit: Payer: Self-pay | Admitting: Internal Medicine

## 2015-07-10 ENCOUNTER — Encounter: Payer: Self-pay | Admitting: Cardiovascular Disease

## 2015-07-10 ENCOUNTER — Ambulatory Visit (INDEPENDENT_AMBULATORY_CARE_PROVIDER_SITE_OTHER): Payer: 59 | Admitting: Cardiovascular Disease

## 2015-07-10 VITALS — BP 120/90 | HR 47 | Ht 70.0 in | Wt 194.9 lb

## 2015-07-10 DIAGNOSIS — E782 Mixed hyperlipidemia: Secondary | ICD-10-CM | POA: Diagnosis not present

## 2015-07-10 DIAGNOSIS — Z8249 Family history of ischemic heart disease and other diseases of the circulatory system: Secondary | ICD-10-CM

## 2015-07-10 DIAGNOSIS — Z0000 Encounter for general adult medical examination without abnormal findings: Secondary | ICD-10-CM

## 2015-07-10 DIAGNOSIS — E1121 Type 2 diabetes mellitus with diabetic nephropathy: Secondary | ICD-10-CM | POA: Diagnosis not present

## 2015-07-10 NOTE — Patient Instructions (Signed)
No change in current medications   Your physician wants you to follow-up in 12 months with Dr Oval Linsey. You will receive a reminder letter in the mail two months in advance. If you don't receive a letter, please call our office to schedule the follow-up appointment.  If you need a refill on your cardiac medications before your next appointment, please call your pharmacy.

## 2015-07-10 NOTE — Progress Notes (Signed)
Cardiology Office Note   Date:  07/10/2015   ID:  Luke West, DOB 1956/06/02, MRN [id number]  PCP:  Binnie Rail, MD Stacu Burns Cardiologist:   Sharol Harness, MD   Chief Complaint  Patient presents with  . New Patient (Initial Visit)      History of Present Illness: Luke West is a 60 y.o. male with diabetes mellitus type 2 and hyperlipidemia who presents to establish care.  Luke West has been feeling well.  He exercises 5-6 times per week in his home gym either on the elliptical or recumbent bike.  He also stretches and dose pilates.  He does not have chest pain or shortness off breath with these activities.  He does sometime note tingling in his feet when he exercises.  He denies lightheadedness, palpitations, lower extremity edema, orthopnea or PND.  Luke West has a family history of premature coronary artery disease.  His brother and sister both had heart attacks in their 2s.  His father had a AAA.  He was previously a patient of Dr. Mar West and underwent stress testing 10 years ago that was negative for ischemia.  He presents today for evaluation of his cardiovascular risk.   Past Medical History  Diagnosis Date  . DM (diabetes mellitus) ([place])   . Raynaud phenomenon   . [name] syndrome 2008    Total bilirubin 1.4  . Hyperlipemia     Past Surgical History  Procedure Laterality Date  . Shoulder surgery      post dislocation sequellae  . Tonsillectomy and adenoidectomy    . Colonoscopy  2005 & 2010    negative X 2; Dr Watt Climes (due 2020)     Current Outpatient Prescriptions  Medication Sig Dispense Refill  . aspirin 81 MG tablet Take 160 mg by mouth daily.    Marland Kitchen atorvastatin (LIPITOR) 20 MG tablet Take 1 tablet by mouth  daily 90 tablet 3  . glipiZIDE (GLUCOTROL XL) 5 MG 24 hr tablet Take 1 tablet (5 mg total) by mouth daily with breakfast. 90 tablet 1  . glucose blood (ONE TOUCH ULTRA TEST) test strip Use to test blood sugar 2 times daily as  instructed. Dx:E11.65 100 each 5  . INVOKANA 100 MG TABS tablet Take 1 tablet by mouth  daily 90 tablet 0  . Lactobacillus-Inulin (CULTURELLE DIGESTIVE HEALTH PO) Take 1 capsule by mouth daily.    . metFORMIN (GLUCOPHAGE XR) 500 MG 24 hr tablet Take 4 tablets (2,000 mg total) by mouth daily with supper. 120 tablet 2  . Multiple Vitamin (MULTIVITAMIN) tablet Take 1 tablet by mouth daily.    . saxagliptin HCl (ONGLYZA) 5 MG TABS tablet Take 1 tablet (5 mg total) by mouth daily. 90 tablet 1   No current facility-administered medications for this visit.    Allergies:   Review of patient's allergies indicates no known allergies.    Social History:  The patient  reports that he quit smoking about 25 years ago. He does not have any smokeless tobacco history on file. He reports that he drinks about 8.4 oz of alcohol per week. He reports that he does not use illicit drugs.   Family History:  The patient's family history includes Aneurysm in his father, maternal grandmother, mother, and paternal grandmother; Diabetes in his brother and maternal grandfather; Heart attack (age of onset: 22) in his sister; Hyperlipidemia in his mother; Lung cancer in his father, paternal grandfather, and paternal uncle.  ROS:  Please see the history of present illness.   Otherwise, review of systems are positive for none.   All other systems are reviewed and negative.    PHYSICAL EXAM: VS:  BP 120/90 mmHg  Pulse 47  Ht 5\' 10"  (1.778 m)  Wt 88.406 kg (194 lb 14.4 oz)  BMI 27.97 kg/m2 , BMI Body mass index is 27.97 kg/(m^2). GENERAL:  Well appearing HEENT:  Pupils equal round and reactive, fundi not visualized, oral mucosa unremarkable NECK:  No jugular venous distention, waveform within normal limits, carotid upstroke brisk and symmetric, no bruits, no thyromegaly LYMPHATICS:  No cervical adenopathy LUNGS:  Clear to auscultation bilaterally HEART:  RRR.  PMI not displaced or sustained,S1 and S2 within normal  limits, no S3, no S4, no clicks, no rubs, no murmurs ABD:  Flat, positive bowel sounds normal in frequency in pitch, no bruits, no rebound, no guarding, no midline pulsatile mass, no hepatomegaly, no splenomegaly EXT:  2 plus pulses throughout, no edema, no cyanosis no clubbing SKIN:  No rashes no nodules NEURO:  Cranial nerves II through XII grossly intact, motor grossly intact throughout PSYCH:  Cognitively intact, oriented to person place and time    EKG:  EKG is ordered today. The ekg ordered today demonstrates sinus bradycardia.  Rate 47 bpm.  LVH with repolarization abnormality.    Recent Labs: 03/12/2015: ALT 11; BUN 23; Creatinine, Ser 1.18; Hemoglobin 15.2; Platelets 141.0*; Potassium 5.5*; Sodium 138; TSH 2.13    Lipid Panel    Component Value Date/Time   CHOL 181 03/12/2015 0740   TRIG 147.0 03/12/2015 0740   HDL 52.10 03/12/2015 0740   CHOLHDL 3 03/12/2015 0740   VLDL 29.4 03/12/2015 0740   LDLCALC 99 03/12/2015 0740      Wt Readings from Last 3 Encounters:  07/10/15 88.406 kg (194 lb 14.4 oz)  04/15/15 86.728 kg (191 lb 3.2 oz)  03/11/15 83.008 kg (183 lb)      ASSESSMENT AND PLAN:  # Cardiovascular risk:  Luke West has several family members with premature cardiovascular disease.  He was congratulated on his commitment to exercise and risk reduction.  He is not currently having any symptoms, so stress testing is not indicated.  Coronary calcium scoring could be considered to evaluate his risk.  However, this would only help to determine if he should be on maximal medical therapy.  He is already on atorvastatin with an LDL of  99.  He also cannot be on a beta blocker because he is bradycardic.  Therefore, it does not seem to be helpful.  For now, continue with regular exercise and a well-rounded diet.  Continue aspirin and atorvastatin.  Low threshold for cardiovascular testing if he develops symptoms.  # Hyperlipidemia: Continue atorvastatin.  LDL 99 on  03/12/15.   Current medicines are reviewed at length with the patient today.  The patient does not have concerns regarding medicines.  The following changes have been made:  no change  Labs/ tests ordered today include:   Orders Placed This Encounter  Procedures  . EKG 12-Lead     Disposition:   FU with Luke Sheriff C. Oval Linsey, MD, [facility] in 1 year.    This note was written with the assistance of speech recognition software.  Please excuse any transcriptional errors.  Signed, Londyn Hotard C. Oval Linsey, MD, [facility]  07/10/2015 5:13 PM    [facility]

## 2015-07-16 ENCOUNTER — Encounter: Payer: Self-pay | Admitting: Internal Medicine

## 2015-07-16 ENCOUNTER — Ambulatory Visit (INDEPENDENT_AMBULATORY_CARE_PROVIDER_SITE_OTHER): Payer: 59 | Admitting: Internal Medicine

## 2015-07-16 ENCOUNTER — Other Ambulatory Visit (INDEPENDENT_AMBULATORY_CARE_PROVIDER_SITE_OTHER): Payer: 59 | Admitting: *Deleted

## 2015-07-16 VITALS — BP 114/68 | HR 63 | Temp 97.8°F | Resp 12 | Wt 192.0 lb

## 2015-07-16 DIAGNOSIS — E1121 Type 2 diabetes mellitus with diabetic nephropathy: Secondary | ICD-10-CM | POA: Diagnosis not present

## 2015-07-16 LAB — POCT GLYCOSYLATED HEMOGLOBIN (HGB A1C): Hemoglobin A1C: 6.5

## 2015-07-16 MED ORDER — SAXAGLIPTIN HCL 5 MG PO TABS: 5.0000 mg | ORAL_TABLET | Freq: Every day | ORAL | Status: DC

## 2015-07-16 NOTE — Progress Notes (Signed)
Patient ID: Luke West, male   DOB: 1955-09-27, 60 y.o.   MRN: [id number]  HPI: Luke West is a 60 y.o.-year-old male, returning for follow-up for DM2, dx in 2003, non-insulin-dependent, uncontrolled, with complications (CKD). Last visit 3 mo ago.  He has an URI >> on Z pack >> feels better.  Last hemoglobin A1c was: Lab Results  Component Value Date   HGBA1C 6.9 04/15/2015   HGBA1C 7.1* 11/11/2014   HGBA1C 7.8* 07/17/2014   Pt is on a regimen of: - Invokana 100 mg in am. - Onglyza 5 mg in am - Metformin XR 2000 mg with dinner >> stopped for a period of time 2/2 diarrhea >> now inconsistently 1000 mg 2x a day: with L and D - Glipizide XL 5 mg in am Previously on JanuMet and Kombiglyze.  Pt checks his sugars 2-4 a day and they are better: - am: 128-190 (202) - alcohol >> 130-177 >> 116-182 >> 137-166 >> 123-184 >> 143-187, 206 - 2h after b'fast: 124-163, 211 >> 127-234 (200s if forgets meds) >> 126-176 >> 150, 167-283 >> 141-174 - before lunch: 123-153 >> 134-171 >> 120-179 >> 142 >> 120-140 - 2h after lunch: >> 123-168, 196 >> 100-175 >> 94-161 >> 128-257 >> 114-208 - before dinner: 110-144, 216 >> 126-180, 211 >> 125-157 >> 140-289 >> 118-137, 156 - 2h after dinner: 87-134 >> 130-181 >> 144-193 >> 197 >> 109-188 - bedtime: 119-170 >> 96-130, 181 >> 127-171 >> 131-175 >> 162-243 >> 83-164 - nighttime:n/c No lows. Lowest sugar was 120 >> 87 >> 87 >> 95 >> 57 x1 (vodka); he has hypoglycemia awareness - 70. Highest sugar was 200s >> 240 >> 247 >> 190s >> 208.  Glucometer: One Touch Ultra Mini  Pt's meals are: - Breakfast: 3 days a week, yoghurt  - Lunch: wide variety - Dinner: meat, veggies, strach - Snacks: 1+ - nuts; crackers + PB, chips and salsa  He exercises 5x a week.  - + CKD, last BUN/creatinine:  Lab Results  Component Value Date   BUN 23 03/12/2015   CREATININE 1.18 03/12/2015   - last set of lipids: Lab Results  Component Value Date   CHOL 181  03/12/2015   HDL 52.10 03/12/2015   LDLCALC 99 03/12/2015   TRIG 147.0 03/12/2015   CHOLHDL 3 03/12/2015  On Lipitor. - last eye exam was in 03/28/2015. Dr. Kathrin Penner.No DR.  - no numbness and tingling in his feet.  ROS: Constitutional: + weight gain, no fatigue, no subjective hyperthermia/hypothermia Eyes: no blurry vision, no xerophthalmia ENT: no sore throat, no nodules palpated in throat, no dysphagia/odynophagia, no hoarseness Cardiovascular: no CP/SOB/palpitations/leg swelling Respiratory: + cough/no SOB Gastrointestinal: no N/V/+ D/no C Musculoskeletal: no muscle/joint aches Skin: no rash; no easy bruising, + hair loss Neurological: no tremors/numbness/tingling/dizziness  I reviewed pt's medications, allergies, PMH, social hx, family hx, and changes were documented in the history of present illness. Otherwise, unchanged from my initial visit note:  Past Medical History  Diagnosis Date  . DM (diabetes mellitus) ([place])   . Raynaud phenomenon   . [name] syndrome 2008    Total bilirubin 1.4  . Hyperlipemia    Past Surgical History  Procedure Laterality Date  . Shoulder surgery      post dislocation sequellae  . Tonsillectomy and adenoidectomy    . Colonoscopy  2005 & 2010    negative X 2; Dr Watt Climes (due 2020)   History   Social History  . Marital Status:  Married    Spouse Name: N/A  . Number of Children: 3   Occupational History  . Real estate development/construction   Social History Main Topics  . Smoking status: Former Smoker    Quit date: 06/28/1990  . Smokeless tobacco: Not on file     Comment: [id number] 3-4 year . Smoked [phone] , up to 1-2 ppd  . Alcohol Use: 8.4 oz/week    14 Glasses of wine per week     Comment:  socially  . Drug Use: No   Current Outpatient Prescriptions on File Prior to Visit  Medication Sig Dispense Refill  . aspirin 81 MG tablet Take 160 mg by mouth daily.    Marland Kitchen atorvastatin (LIPITOR) 20 MG tablet Take 1 tablet by mouth   daily 90 tablet 3  . glipiZIDE (GLUCOTROL XL) 5 MG 24 hr tablet Take 1 tablet (5 mg total) by mouth daily with breakfast. 90 tablet 1  . glucose blood (ONE TOUCH ULTRA TEST) test strip Use to test blood sugar 2 times daily as instructed. Dx:E11.65 100 each 5  . INVOKANA 100 MG TABS tablet Take 1 tablet by mouth  daily 90 tablet 0  . Lactobacillus-Inulin (CULTURELLE DIGESTIVE HEALTH PO) Take 1 capsule by mouth daily.    . metFORMIN (GLUCOPHAGE XR) 500 MG 24 hr tablet Take 4 tablets (2,000 mg total) by mouth daily with supper. 120 tablet 2  . Multiple Vitamin (MULTIVITAMIN) tablet Take 1 tablet by mouth daily.    . saxagliptin HCl (ONGLYZA) 5 MG TABS tablet Take 1 tablet (5 mg total) by mouth daily. 90 tablet 1   No current facility-administered medications on file prior to visit.   No Known Allergies Family History  Problem Relation Age of Onset  . Aneurysm Mother     cns  . Hyperlipidemia Mother   . Lung cancer Father     smoker  . Heart attack Sister 19  . Diabetes Brother     type 1  . Lung cancer Paternal Uncle     smoker  . Aneurysm Maternal Grandmother     cns;PRE 51 (mid 16s)  . Diabetes Maternal Grandfather     type 1  . Lung cancer Paternal Grandfather     smoker  . Aneurysm Paternal Grandmother     AAA  . Aneurysm Father     AAA   PE: BP 114/68 mmHg  Pulse 63  Temp(Src) 97.8 F (36.6 C) (Oral)  Resp 12  SpO2 95% Body mass index is 27.55 kg/(m^2). Wt Readings from Last 3 Encounters:  07/16/15 192 lb (87.091 kg)  07/10/15 194 lb 14.4 oz (88.406 kg)  04/15/15 191 lb 3.2 oz (86.728 kg)   Constitutional: slightly overweight, in NAD Eyes: PERRLA, EOMI, no exophthalmos ENT: moist mucous membranes, no thyromegaly, no cervical lymphadenopathy Cardiovascular: RRR, No MRG Respiratory: CTA B Gastrointestinal: abdomen soft, NT, ND, BS+ Musculoskeletal: no deformities, strength intact in all 4 Skin: moist, warm, no rashes except rosacea on face Neurological: no  tremor with outstretched hands, DTR normal in all 4  ASSESSMENT: 1. DM2, non-insulin-dependent, uncontrolled, with complications - CKD  PLAN:  1. Patient with long-standing, uncontrolled diabetes, on oral antidiabetic regimen, with worse sugars over the summer, but now better after adding Glipizide extended-release in a.m. Since he complains of diarrhea in the morning, I advised him to take metformin 3x a day. - I suggested to:   Patient Instructions  Please try to split Metformin XR in 3 doses. Continue:  -  Invokana 100 mg in am. - Onglyza 5 mg in am - Glipizide XL 5 mg in am, before b'fast.  Please come back for a follow-up appointment in 4 months.  - Continue checking sugars at different times of the day - check 2 times a day, rotating checks - advised for yearly eye exams >> he is UTD - check HbA1c today >> 6.5% (improved!).  - He had the flu shot this season  - Return to clinic in 4 mo with sugar log

## 2015-07-16 NOTE — Patient Instructions (Addendum)
Please try to split Metformin XR in 3 doses. Continue:  - Invokana 100 mg in am. - Onglyza 5 mg in am - Glipizide XL 5 mg in am, before b'fast.  Please come back for a follow-up appointment in 4 months.

## 2015-10-06 ENCOUNTER — Other Ambulatory Visit: Payer: Self-pay | Admitting: Internal Medicine

## 2015-11-13 ENCOUNTER — Ambulatory Visit (INDEPENDENT_AMBULATORY_CARE_PROVIDER_SITE_OTHER): Payer: 59 | Admitting: Internal Medicine

## 2015-11-13 ENCOUNTER — Other Ambulatory Visit (INDEPENDENT_AMBULATORY_CARE_PROVIDER_SITE_OTHER): Payer: 59 | Admitting: *Deleted

## 2015-11-13 ENCOUNTER — Encounter: Payer: Self-pay | Admitting: Internal Medicine

## 2015-11-13 VITALS — BP 114/68 | HR 59 | Temp 97.6°F | Resp 12 | Wt 194.0 lb

## 2015-11-13 DIAGNOSIS — E1121 Type 2 diabetes mellitus with diabetic nephropathy: Secondary | ICD-10-CM | POA: Diagnosis not present

## 2015-11-13 DIAGNOSIS — E1165 Type 2 diabetes mellitus with hyperglycemia: Secondary | ICD-10-CM | POA: Diagnosis not present

## 2015-11-13 LAB — POCT GLYCOSYLATED HEMOGLOBIN (HGB A1C): HEMOGLOBIN A1C: 6.3

## 2015-11-13 NOTE — Progress Notes (Signed)
Patient ID: Luke West, male   DOB: 03/30/1956, 60 y.o.   MRN: [id number]  HPI: Luke West is a 60 y.o.-year-old male, returning for follow-up for DM2, dx in 2003, non-insulin-dependent, uncontrolled, with complications (CKD). Last visit 3 mo ago.  Last hemoglobin A1c was: Lab Results  Component Value Date   HGBA1C 6.5 07/16/2015   HGBA1C 6.9 04/15/2015   HGBA1C 7.1* 11/11/2014   Pt is on a regimen of: - Invokana 100 mg in am. - Onglyza 5 mg in am - Metformin XR 2000 mg with dinner >> stopped for a period of time 2/2 diarrhea >> now inconsistently  500 mg 3x a day - Glipizide XL 5 mg in am Previously on JanuMet and Kombiglyze.  Pt checks his sugars 2-4 a day and they are better: - am: 128-190 (202) - alcohol >> 130-177 >> 116-182 >> 137-166 >> 123-184 >> 143-187, 206 >> 109-184, 208 - 2h after b'fast: 124-163, 211 >> 127-234 (200s if forgets meds) >> 126-176 >> 150, 167-283 >> 141-174 >> 128-231 - before lunch: 123-153 >> 134-171 >> 120-179 >> 142 >> 120-140 >> 138-175 - 2h after lunch: >> 123-168, 196 >> 100-175 >> 94-161 >> 128-257 >> 114-208 >> 129-178 - before dinner: 110-144, 216 >> 126-180, 211 >> 125-157 >> 140-289 >> 118-137, 156 >> 106-143 - 2h after dinner: 87-134 >> 130-181 >> 144-193 >> 197 >> 109-188 >> 120-169 - bedtime: 119-170 >> 96-130, 181 >> 127-171 >> 131-175 >> 162-243 >> 83-164 >> 88, 116-165 - nighttime:n/c No lows. Lowest sugar was 120 >> 87 >> 87 >> 95 >> 57 x1 (vodka) >> 88; he has hypoglycemia awareness - 70. Highest sugar was 200s >> 240 >> 247 >> 190s >> 208 >> 275, 314.  Glucometer: One Touch Ultra Mini  Pt's meals are: - Breakfast: 3 days a week, yoghurt  - Lunch: wide variety - Dinner: meat, veggies, strach - Snacks: 1+ - nuts; crackers + PB, chips and salsa  He exercises 5x a week.  - + CKD, last BUN/creatinine:  Lab Results  Component Value Date   BUN 23 03/12/2015   CREATININE 1.18 03/12/2015   - last set of lipids: Lab Results   Component Value Date   CHOL 181 03/12/2015   HDL 52.10 03/12/2015   LDLCALC 99 03/12/2015   TRIG 147.0 03/12/2015   CHOLHDL 3 03/12/2015  On Lipitor. - last eye exam was in 03/28/2015. Dr. Kathrin Penner.No DR.  - no numbness and tingling in his feet.  ROS: Constitutional: no weight gain/loss, no fatigue, no subjective hyperthermia/hypothermia Eyes: no blurry vision, no xerophthalmia ENT: no sore throat, no nodules palpated in throat, no dysphagia/odynophagia, no hoarseness Cardiovascular: no CP/SOB/palpitations/leg swelling Respiratory: no cough/no SOB Gastrointestinal: no N/V/D/C Musculoskeletal: no muscle/joint aches Skin: no rash; no easy bruising Neurological: no tremors/numbness/tingling/dizziness  I reviewed pt's medications, allergies, PMH, social hx, family hx, and changes were documented in the history of present illness. Otherwise, unchanged from my initial visit note:  Past Medical History  Diagnosis Date  . DM (diabetes mellitus) ([place])   . Raynaud phenomenon   . Luke West 2008    Total bilirubin 1.4  . Hyperlipemia    Past Surgical History  Procedure Laterality Date  . Shoulder surgery      post dislocation sequellae  . Tonsillectomy and adenoidectomy    . Colonoscopy  2005 & 2010    negative X 2; Dr Watt Climes (due 2020)   History   Social History  . Marital  Status: Married    Spouse Name: N/A  . Number of Children: 3   Occupational History  . Real estate development/construction   Social History Main Topics  . Smoking status: Former Smoker    Quit date: 06/28/1990  . Smokeless tobacco: Not on file     Comment: [id number] 3-4 year . Smoked [phone] , up to 1-2 ppd  . Alcohol Use: 8.4 oz/week    14 Glasses of wine per week     Comment:  socially  . Drug Use: No   Current Outpatient Prescriptions on File Prior to Visit  Medication Sig Dispense Refill  . aspirin 81 MG tablet Take 160 mg by mouth daily.    Marland Kitchen atorvastatin (LIPITOR) 20 MG tablet  Take 1 tablet by mouth  daily 90 tablet 3  . glipiZIDE (GLUCOTROL XL) 5 MG 24 hr tablet take 1 tablet by mouth once daily with BREAKFAST 90 tablet 0  . glucose blood (ONE TOUCH ULTRA TEST) test strip Use to test blood sugar 2 times daily as instructed. Dx:E11.65 100 each 5  . INVOKANA 100 MG TABS tablet Take 1 tablet by mouth  daily 90 tablet 0  . Lactobacillus-Inulin (CULTURELLE DIGESTIVE HEALTH PO) Take 1 capsule by mouth daily.    . metFORMIN (GLUCOPHAGE XR) 500 MG 24 hr tablet Take 4 tablets (2,000 mg total) by mouth daily with supper. 120 tablet 2  . Multiple Vitamin (MULTIVITAMIN) tablet Take 1 tablet by mouth daily.    . saxagliptin HCl (ONGLYZA) 5 MG TABS tablet Take 1 tablet (5 mg total) by mouth daily. 90 tablet 1   No current facility-administered medications on file prior to visit.   No Known Allergies Family History  Problem Relation Age of Onset  . Aneurysm Mother     cns  . Hyperlipidemia Mother   . Lung cancer Father     smoker  . Heart attack Sister 69  . Diabetes Brother     type 1  . Lung cancer Paternal Uncle     smoker  . Aneurysm Maternal Grandmother     cns;PRE 66 (mid 58s)  . Diabetes Maternal Grandfather     type 1  . Lung cancer Paternal Grandfather     smoker  . Aneurysm Paternal Grandmother     AAA  . Aneurysm Father     AAA   PE: BP 114/68 mmHg  Pulse 59  Temp(Src) 97.6 F (36.4 C) (Oral)  Resp 12  Wt 194 lb (87.998 kg)  SpO2 94% Body mass index is 27.84 kg/(m^2). Wt Readings from Last 3 Encounters:  11/13/15 194 lb (87.998 kg)  07/16/15 192 lb (87.091 kg)  07/10/15 194 lb 14.4 oz (88.406 kg)   Constitutional: slightly overweight, in NAD Eyes: PERRLA, EOMI, no exophthalmos ENT: moist mucous membranes, no thyromegaly, no cervical lymphadenopathy Cardiovascular: RRR, No MRG Respiratory: CTA B Gastrointestinal: abdomen soft, NT, ND, BS+ Musculoskeletal: no deformities, strength intact in all 4 Skin: moist, warm, no rashes except rosacea  on face Neurological: no tremor with outstretched hands, DTR normal in all 4  ASSESSMENT: 1. DM2, non-insulin-dependent, uncontrolled, with complications - CKD  PLAN:  1. Patient with long-standing, uncontrolled diabetes, on oral antidiabetic regimen, better controlled after adding Glipizide extended-release in a.m. Since he complained of diarrhea in the morning, at last visit I advised him to take metformin 3x a day. He still has some diarrhea. Last HbA1c 6.5%, at goal. - I suggested to:  Patient Instructions  Please continue:  -  Metformin XR 500 mg 3x a day with meals - Invokana 100 mg in am. - Onglyza 5 mg in am - Glipizide XL 5 mg in am, before b'fast.  Please come back for a follow-up appointment in 4 months.  - Continue checking sugars at different times of the day - check 2 times a day, rotating checks - advised for yearly eye exams >> he is UTD - check HbA1c today >> 6.3% (improved!).  - He had the flu shot this season  - Return to clinic in 4 mo with sugar log

## 2015-11-13 NOTE — Patient Instructions (Signed)
Please continue:  - Metformin XR 500 mg 3x a day with meals - Invokana 100 mg in am. - Onglyza 5 mg in am - Glipizide XL 5 mg in am, before b'fast.  Please come back for a follow-up appointment in 4 months.

## 2016-01-05 ENCOUNTER — Other Ambulatory Visit: Payer: Self-pay | Admitting: Internal Medicine

## 2016-01-09 ENCOUNTER — Other Ambulatory Visit: Payer: Self-pay | Admitting: Internal Medicine

## 2016-01-09 NOTE — Telephone Encounter (Signed)
Patient need a refill of glipiZIDE (GLUCOTROL XL) 5 MG 24 hr tablet, [id number] - [place], [place] - [place] (Phone) [phone] (Fax)

## 2016-03-09 ENCOUNTER — Telehealth: Payer: Self-pay | Admitting: Emergency Medicine

## 2016-03-09 DIAGNOSIS — E782 Mixed hyperlipidemia: Secondary | ICD-10-CM

## 2016-03-09 NOTE — Telephone Encounter (Signed)
Pt requested to have labs done before appt.

## 2016-03-10 ENCOUNTER — Other Ambulatory Visit (INDEPENDENT_AMBULATORY_CARE_PROVIDER_SITE_OTHER): Payer: 59

## 2016-03-10 DIAGNOSIS — E782 Mixed hyperlipidemia: Secondary | ICD-10-CM

## 2016-03-10 LAB — COMPREHENSIVE METABOLIC PANEL
ALBUMIN: 4.7 g/dL (ref 3.5–5.2)
ALK PHOS: 66 U/L (ref 39–117)
ALT: 13 U/L (ref 0–53)
AST: 21 U/L (ref 0–37)
BUN: 19 mg/dL (ref 6–23)
CALCIUM: 9.7 mg/dL (ref 8.4–10.5)
CHLORIDE: 100 meq/L (ref 96–112)
CO2: 25 mEq/L (ref 19–32)
CREATININE: 1.19 mg/dL (ref 0.40–1.50)
GFR: 66.3 mL/min (ref >60.00)
Glucose, Bld: 164 mg/dL — ABNORMAL HIGH (ref 70–99)
Potassium: 5.1 mEq/L (ref 3.5–5.1)
Sodium: 138 mEq/L (ref 135–145)
TOTAL PROTEIN: 7.3 g/dL (ref 6.0–8.3)
Total Bilirubin: 0.6 mg/dL (ref 0.2–1.2)

## 2016-03-10 LAB — LIPID PANEL
CHOL/HDL RATIO: 4
CHOLESTEROL: 219 mg/dL — AB (ref 0–200)
HDL: 49.8 mg/dL (ref >39.00)
LDL CALC: 134 mg/dL — AB (ref 0–99)
NonHDL: 169.2
TRIGLYCERIDES: 177 mg/dL — AB (ref 0.0–149.0)
VLDL: 35.4 mg/dL (ref 0.0–40.0)

## 2016-03-10 LAB — CBC WITH DIFFERENTIAL/PLATELET
BASOS ABS: 0 10*3/uL (ref 0.0–0.1)
BASOS PCT: 0.3 % (ref 0.0–3.0)
EOS ABS: 0.1 10*3/uL (ref 0.0–0.7)
Eosinophils Relative: 1.8 % (ref 0.0–5.0)
HEMATOCRIT: 45.1 % (ref 39.0–52.0)
HEMOGLOBIN: 15.4 g/dL (ref 13.0–17.0)
LYMPHS PCT: 28.9 % (ref 12.0–46.0)
Lymphs Abs: 1.5 10*3/uL (ref 0.7–4.0)
MCHC: 34.1 g/dL (ref 30.0–36.0)
MCV: 93.7 fl (ref 78.0–100.0)
MONO ABS: 0.4 10*3/uL (ref 0.1–1.0)
Monocytes Relative: 7.7 % (ref 3.0–12.0)
Neutro Abs: 3.3 10*3/uL (ref 1.4–7.7)
Neutrophils Relative %: 61.3 % (ref 43.0–77.0)
Platelets: 171 10*3/uL (ref 150.0–400.0)
RBC: 4.81 Mil/uL (ref 4.22–5.81)
RDW: 12.9 % (ref 11.5–15.5)
WBC: 5.3 10*3/uL (ref 4.0–10.5)

## 2016-03-10 LAB — TSH: TSH: 2.57 u[IU]/mL (ref 0.35–4.50)

## 2016-03-11 ENCOUNTER — Encounter: Payer: 59 | Admitting: Internal Medicine

## 2016-03-15 ENCOUNTER — Encounter: Payer: Self-pay | Admitting: Internal Medicine

## 2016-03-15 ENCOUNTER — Ambulatory Visit: Payer: 59 | Admitting: Internal Medicine

## 2016-03-15 ENCOUNTER — Ambulatory Visit (INDEPENDENT_AMBULATORY_CARE_PROVIDER_SITE_OTHER): Payer: 59 | Admitting: Internal Medicine

## 2016-03-15 ENCOUNTER — Other Ambulatory Visit: Payer: Self-pay | Admitting: Internal Medicine

## 2016-03-15 VITALS — BP 128/86 | HR 56 | Temp 97.8°F | Resp 16 | Wt 195.0 lb

## 2016-03-15 DIAGNOSIS — L409 Psoriasis, unspecified: Secondary | ICD-10-CM | POA: Insufficient documentation

## 2016-03-15 DIAGNOSIS — Z1159 Encounter for screening for other viral diseases: Secondary | ICD-10-CM | POA: Diagnosis not present

## 2016-03-15 DIAGNOSIS — Z114 Encounter for screening for human immunodeficiency virus [HIV]: Secondary | ICD-10-CM

## 2016-03-15 DIAGNOSIS — N400 Enlarged prostate without lower urinary tract symptoms: Secondary | ICD-10-CM

## 2016-03-15 DIAGNOSIS — E782 Mixed hyperlipidemia: Secondary | ICD-10-CM

## 2016-03-15 DIAGNOSIS — L719 Rosacea, unspecified: Secondary | ICD-10-CM

## 2016-03-15 DIAGNOSIS — Z0000 Encounter for general adult medical examination without abnormal findings: Secondary | ICD-10-CM | POA: Diagnosis not present

## 2016-03-15 DIAGNOSIS — E1121 Type 2 diabetes mellitus with diabetic nephropathy: Secondary | ICD-10-CM

## 2016-03-15 MED ORDER — ATORVASTATIN CALCIUM 20 MG PO TABS: 20.0000 mg | ORAL_TABLET | Freq: Every day | ORAL | 3 refills | Status: DC

## 2016-03-15 NOTE — Patient Instructions (Addendum)
Have repeat blood work done in a few months to recheck your cholesterol and have the hiv and hepatitis c tests done.   All other Health Maintenance issues reviewed.   All recommended immunizations and age-appropriate screenings are up-to-date or discussed.  Flu vaccine administered today.   Medications reviewed and updated.   No changes recommended at this time.  Your prescription(s) have been submitted to your pharmacy. Please take as directed and contact our office if you believe you are having problem(s) with the medication(s).   Please followup in one year  Health Maintenance, Male A healthy lifestyle and preventative care can promote health and wellness.  Maintain regular health, dental, and eye exams.  Eat a healthy diet. Foods like vegetables, fruits, whole grains, low-fat dairy products, and lean protein foods contain the nutrients you need and are low in calories. Decrease your intake of foods high in solid fats, added sugars, and salt. Get information about a proper diet from your health care provider, if necessary.  Regular physical exercise is one of the most important things you can do for your health. Most adults should get at least 150 minutes of moderate-intensity exercise (any activity that increases your heart rate and causes you to sweat) each week. In addition, most adults need muscle-strengthening exercises on 2 or more days a week.   Maintain a healthy weight. The body mass index (BMI) is a screening tool to identify possible weight problems. It provides an estimate of body fat based on height and weight. Your health care provider can find your BMI and can help you achieve or maintain a healthy weight. For males 20 years and older:  A BMI below 18.5 is considered underweight.  A BMI of 18.5 to 24.9 is normal.  A BMI of 25 to 29.9 is considered overweight.  A BMI of 30 and above is considered obese.  Maintain normal blood lipids and cholesterol by exercising and  minimizing your intake of saturated fat. Eat a balanced diet with plenty of fruits and vegetables. Blood tests for lipids and cholesterol should begin at age 66 and be repeated every 5 years. If your lipid or cholesterol levels are high, you are over age 18, or you are at high risk for heart disease, you may need your cholesterol levels checked more frequently.Ongoing high lipid and cholesterol levels should be treated with medicines if diet and exercise are not working.  If you smoke, find out from your health care provider how to quit. If you do not use tobacco, do not start.  Lung cancer screening is recommended for adults aged 59-80 years who are at high risk for developing lung cancer because of a history of smoking. A yearly low-dose CT scan of the lungs is recommended for people who have at least a 30-pack-year history of smoking and are current smokers or have quit within the past 15 years. A pack year of smoking is smoking an average of 1 pack of cigarettes a day for 1 year (for example, a 30-pack-year history of smoking could mean smoking 1 pack a day for 30 years or 2 packs a day for 15 years). Yearly screening should continue until the smoker has stopped smoking for at least 15 years. Yearly screening should be stopped for people who develop a health problem that would prevent them from having lung cancer treatment.  If you choose to drink alcohol, do not have more than 2 drinks per day. One drink is considered to be 12 oz (360  mL) of beer, 5 oz (150 mL) of wine, or 1.5 oz (45 mL) of liquor.  Avoid the use of street drugs. Do not share needles with anyone. Ask for help if you need support or instructions about stopping the use of drugs.  High blood pressure causes heart disease and increases the risk of stroke. High blood pressure is more likely to develop in:  People who have blood pressure in the end of the normal range (100-139/85-89 mm Hg).  People who are overweight or  obese.  People who are African American.  If you are 2-35 years of age, have your blood pressure checked every 3-5 years. If you are 58 years of age or older, have your blood pressure checked every year. You should have your blood pressure measured twice--once when you are at a hospital or clinic, and once when you are not at a hospital or clinic. Record the average of the two measurements. To check your blood pressure when you are not at a hospital or clinic, you can use:  An automated blood pressure machine at a pharmacy.  A home blood pressure monitor.  If you are 72-16 years old, ask your health care provider if you should take aspirin to prevent heart disease.  Diabetes screening involves taking a blood sample to check your fasting blood sugar level. This should be done once every 3 years after age 48 if you are at a normal weight and without risk factors for diabetes. Testing should be considered at a younger age or be carried out more frequently if you are overweight and have at least 1 risk factor for diabetes.  Colorectal cancer can be detected and often prevented. Most routine colorectal cancer screening begins at the age of 44 and continues through age 6. However, your health care provider may recommend screening at an earlier age if you have risk factors for colon cancer. On a yearly basis, your health care provider may provide home test kits to check for hidden blood in the stool. A small camera at the end of a tube may be used to directly examine the colon (sigmoidoscopy or colonoscopy) to detect the earliest forms of colorectal cancer. Talk to your health care provider about this at age 89 when routine screening begins. A direct exam of the colon should be repeated every 5-10 years through age 16, unless early forms of precancerous polyps or small growths are found.  People who are at an increased risk for hepatitis B should be screened for this virus. You are considered at high risk  for hepatitis B if:  You were born in a country where hepatitis B occurs often. Talk with your health care provider about which countries are considered high risk.  Your parents were born in a high-risk country and you have not received a shot to protect against hepatitis B (hepatitis B vaccine).  You have HIV or AIDS.  You use needles to inject street drugs.  You live with, or have sex with, someone who has hepatitis B.  You are a man who has sex with other men (MSM).  You get hemodialysis treatment.  You take certain medicines for conditions like cancer, organ transplantation, and autoimmune conditions.  Hepatitis C blood testing is recommended for all people born from [birth year] through 1965 and any individual with known risk factors for hepatitis C.  Healthy men should no longer receive prostate-specific antigen (PSA) blood tests as part of routine cancer screening. Talk to your health care provider  about prostate cancer screening.  Testicular cancer screening is not recommended for adolescents or adult males who have no symptoms. Screening includes self-exam, a health care provider exam, and other screening tests. Consult with your health care provider about any symptoms you have or any concerns you have about testicular cancer.  Practice safe sex. Use condoms and avoid high-risk sexual practices to reduce the spread of sexually transmitted infections (STIs).  You should be screened for STIs, including gonorrhea and chlamydia if:  You are sexually active and are younger than 24 years.  You are older than 24 years, and your health care provider tells you that you are at risk for this type of infection.  Your sexual activity has changed since you were last screened, and you are at an increased risk for chlamydia or gonorrhea. Ask your health care provider if you are at risk.  If you are at risk of being infected with HIV, it is recommended that you take a prescription medicine daily to  prevent HIV infection. This is called pre-exposure prophylaxis (PrEP). You are considered at risk if:  You are a man who has sex with other men (MSM).  You are a heterosexual man who is sexually active with multiple partners.  You take drugs by injection.  You are sexually active with a partner who has HIV.  Talk with your health care provider about whether you are at high risk of being infected with HIV. If you choose to begin PrEP, you should first be tested for HIV. You should then be tested every 3 months for as long as you are taking PrEP.  Use sunscreen. Apply sunscreen liberally and repeatedly throughout the day. You should seek shade when your shadow is shorter than you. Protect yourself by wearing long sleeves, pants, a wide-brimmed hat, and sunglasses year round whenever you are outdoors.  Tell your health care provider of new moles or changes in moles, especially if there is a change in shape or color. Also, tell your health care provider if a mole is larger than the size of a pencil eraser.  A one-time screening for abdominal aortic aneurysm (AAA) and surgical repair of large AAAs by ultrasound is recommended for men aged 54-75 years who are current or former smokers.  Stay current with your vaccines (immunizations).   This information is not intended to replace advice given to you by your health care provider. Make sure you discuss any questions you have with your health care provider.   Document Released: 12/11/2007 Document Revised: 07/05/2014 Document Reviewed: 11/09/2010 Elsevier Interactive Patient Education [employer].

## 2016-03-15 NOTE — Progress Notes (Signed)
Pre visit review using our clinic review tool, if applicable. No additional management support is needed unless otherwise documented below in the visit note. 

## 2016-03-15 NOTE — Assessment & Plan Note (Signed)
Sees urology annually °

## 2016-03-15 NOTE — Assessment & Plan Note (Signed)
LDL increased now compared to last year - has been exercising slightly less and eating less healthy  Will work on lifestyle Continue lipitor at current dose Recheck lipid panel in a few months

## 2016-03-15 NOTE — Progress Notes (Signed)
Subjective:    Patient ID: Luke West, male    DOB: 1956-03-29, 60 y.o.   MRN: [id number]  HPI He is here to establish with a new pcp.   He is here for a physical exam.   He is exercising regularly, but less than usual.  He has not been as compliant with a low fat/cholesterol diet.   Sugars averaging 130-160.  He is taking all of his medications as prescribed.    He has no concerns.   Medications and allergies reviewed with patient and updated if appropriate.  Patient Active Problem List   Diagnosis Date Noted  . Psoriasis 03/15/2016  . Rosacea 03/15/2016  . Family history of premature CAD 06/12/2015  . Type 2 diabetes mellitus with diabetic nephropathy, without long-term current use of insulin ([place]) 04/15/2015  . Nocturnal leg cramps 12/11/2013  . BPH (benign prostatic hypertrophy) 03/05/2010  . Mixed hyperlipidemia 09/21/2007  . GOUT 05/11/2007  . RAYNAUD'S SYNDROME 05/11/2007    Current Outpatient Prescriptions on File Prior to Visit  Medication Sig Dispense Refill  . aspirin 81 MG tablet Take 81 mg by mouth daily.     Marland Kitchen glipiZIDE (GLUCOTROL XL) 5 MG 24 hr tablet take 1 tablet by mouth once daily with BREAKFAST 90 tablet 1  . INVOKANA 100 MG TABS tablet Take 1 tablet by mouth  daily 90 tablet 0  . Lactobacillus-Inulin (CULTURELLE DIGESTIVE HEALTH PO) Take 1 capsule by mouth daily.    . metFORMIN (GLUCOPHAGE-XR) 500 MG 24 hr tablet TAKE 4 TABLETS BY MOUTH  DAILY WITH SUPPER. (Patient taking differently: TAKE 2 TABLETS BY MOUTH  DAILY WITH SUPPER.) 360 tablet 0  . Multiple Vitamin (MULTIVITAMIN) tablet Take 1 tablet by mouth daily.    . ONGLYZA 5 MG TABS tablet Take 1 tablet by mouth  daily 90 tablet 0   No current facility-administered medications on file prior to visit.     Past Medical History:  Diagnosis Date  . DM (diabetes mellitus) ([place])   . [name] syndrome 2008   Total bilirubin 1.4  . Hyperlipemia   . Raynaud phenomenon     Past Surgical History:    Procedure Laterality Date  . COLONOSCOPY  2005 & 2010   negative X 2; Dr Watt Climes (due 2020)  . SHOULDER SURGERY     post dislocation sequellae  . TONSILLECTOMY AND ADENOIDECTOMY      Social History   Social History  . Marital status: Married    Spouse name: N/A  . Number of children: N/A  . Years of education: N/A   Social History Main Topics  . Smoking status: Former Smoker    Quit date: 06/28/1990  . Smokeless tobacco: Not on file     Comment: [id number] 3-4 year . Smoked [phone] , up to 1-2 ppd  . Alcohol use 8.4 oz/week    14 Glasses of wine per week     Comment:  socially  . Drug use: No  . Sexual activity: Not on file   Other Topics Concern  . Not on file   Social History Narrative  . No narrative on file    Family History  Problem Relation Age of Onset  . Aneurysm Mother     cns  . Hyperlipidemia Mother   . Lung cancer Father     smoker  . Aneurysm Father     AAA  . Heart attack Sister 71  . Diabetes Brother     type 1  .  Lung cancer Paternal Uncle     smoker  . Aneurysm Maternal Grandmother     cns;PRE 58 (mid 19s)  . Diabetes Maternal Grandfather     type 1  . Lung cancer Paternal Grandfather     smoker  . Aneurysm Paternal Grandmother     AAA    Review of Systems  Constitutional: Negative for chills, fatigue, fever and unexpected weight change.  Eyes: Negative for visual disturbance.  Respiratory: Negative for cough, shortness of breath and wheezing.   Cardiovascular: Negative for chest pain, palpitations and leg swelling.  Gastrointestinal: Positive for diarrhea (metformin related). Negative for abdominal pain, blood in stool, constipation and nausea.       No gerd   Genitourinary: Negative for dysuria and hematuria.  Musculoskeletal: Negative for arthralgias and back pain.  Skin: Negative for color change and rash.  Neurological: Positive for light-headedness (occaisonal ). Negative for dizziness and headaches.  Psychiatric/Behavioral:  Negative for dysphoric mood. The patient is not nervous/anxious.        Objective:   Vitals:   03/15/16 1613  BP: 128/86  Pulse: (!) 56  Resp: 16  Temp: 97.8 F (36.6 C)   Filed Weights   03/15/16 1613  Weight: 195 lb (88.5 kg)   Body mass index is 27.98 kg/m.   Physical Exam  Constitutional: He appears well-developed and well-nourished. No distress.  HENT:  Head: Normocephalic and atraumatic.  Right Ear: External ear normal.  Left Ear: External ear normal.  Mouth/Throat: Oropharynx is clear and moist.  Normal ear canals and TM b/l  Eyes: Conjunctivae and EOM are normal.  Neck: Neck supple. No tracheal deviation present. No thyromegaly present.  No carotid bruit  Cardiovascular: Normal rate, regular rhythm, normal heart sounds and intact distal pulses.   No murmur heard. Pulmonary/Chest: Effort normal and breath sounds normal. No respiratory distress. He has no wheezes. He has no rales.  Abdominal: Soft. Bowel sounds are normal. He exhibits no distension. There is no tenderness.  Genitourinary: deferred to urology Musculoskeletal: He exhibits no edema.  Lymphadenopathy:    He has no cervical adenopathy.  Skin: Skin is warm and dry. He is not diaphoretic.  Psychiatric: He has a normal mood and affect. His behavior is normal.        Assessment & Plan:   Physical exam: Screening blood work reviewed Immunizations deferred flu vaccine Colonoscopy  Up to date  Eye exams  Up to date  EKG done earlier this year Exercise regular Weight - slightly overweight  -- will work on lifestyle Skin - sees derm twice a year Substance abuse  None  See Problem List for Assessment and Plan of chronic medical problems.  Follow up annually

## 2016-03-15 NOTE — Assessment & Plan Note (Signed)
Lab Results  Component Value Date   HGBA1C 6.3 11/13/2015   Controlled Management per Dr Cruzita Lederer

## 2016-03-23 ENCOUNTER — Other Ambulatory Visit: Payer: Self-pay | Admitting: Internal Medicine

## 2016-03-26 ENCOUNTER — Other Ambulatory Visit: Payer: Self-pay

## 2016-03-26 MED ORDER — SAXAGLIPTIN HCL 5 MG PO TABS: 5.0000 mg | ORAL_TABLET | Freq: Every day | ORAL | 2 refills | Status: DC

## 2016-03-26 MED ORDER — METFORMIN HCL ER 500 MG PO TB24: ORAL_TABLET | ORAL | 0 refills | Status: DC

## 2016-03-26 MED ORDER — CANAGLIFLOZIN 100 MG PO TABS: 100.0000 mg | ORAL_TABLET | Freq: Every day | ORAL | 2 refills | Status: DC

## 2016-04-06 ENCOUNTER — Ambulatory Visit (INDEPENDENT_AMBULATORY_CARE_PROVIDER_SITE_OTHER): Payer: 59 | Admitting: Internal Medicine

## 2016-04-06 ENCOUNTER — Encounter: Payer: Self-pay | Admitting: Internal Medicine

## 2016-04-06 VITALS — BP 130/80 | HR 61 | Ht 70.0 in | Wt 197.0 lb

## 2016-04-06 DIAGNOSIS — E1121 Type 2 diabetes mellitus with diabetic nephropathy: Secondary | ICD-10-CM

## 2016-04-06 LAB — POCT GLYCOSYLATED HEMOGLOBIN (HGB A1C): HEMOGLOBIN A1C: 6.3

## 2016-04-06 MED ORDER — GLIPIZIDE ER 5 MG PO TB24
ORAL_TABLET | ORAL | 3 refills | Status: DC
Start: 2016-04-06 — End: 2016-11-24

## 2016-04-06 NOTE — Addendum Note (Signed)
Addended by: Caprice Beaver T on: 04/06/2016 03:23 PM   Modules accepted: Orders

## 2016-04-06 NOTE — Patient Instructions (Signed)
Please continue:  - Metformin XR 500 mg 3x a day with meals - Invokana 100 mg in am. - Onglyza 5 mg in am - Glipizide XL 5 mg in am, before b'fast.  Please come back for a follow-up appointment in 4 months.

## 2016-04-06 NOTE — Progress Notes (Addendum)
Patient ID: Luke West, male   DOB: Dec 22, 1955, 60 y.o.   MRN: [id number]  HPI: Luke West is a 60 y.o.-year-old male, returning for follow-up for DM2, dx in 2003, non-insulin-dependent, uncontrolled, with complications (CKD). Last visit 5 mo ago.  Last hemoglobin A1c was: Lab Results  Component Value Date   HGBA1C 6.3 11/13/2015   HGBA1C 6.5 07/16/2015   HGBA1C 6.9 04/15/2015   Pt is on a regimen of: - Invokana 100 mg in am. - Onglyza 5 mg in am - Metformin XR 2000 mg with dinner >> stopped for a period of time 2/2 diarrhea >> now inconsistently  500 mg 3x a day - Glipizide XL 5 mg in am Previously on JanuMet and Kombiglyze.  Pt checks his sugars 2-4 a day and they are better: - am: 130-177 >> 116-182 >> 137-166 >> 123-184 >> 143-187, 206 >> 109-184, 208 >> 115-204 - 2h after b'fast: 127-234 (200s if forgets meds) >> 126-176 >> 150, 167-283 >> 141-174 >> 128-231 >> 161, 251 - before lunch: 123-153 >> 134-171 >> 120-179 >> 142 >> 120-140 >> 138-175 >> 142, 169 - 2h after lunch: >> 123-168, 196 >> 100-175 >> 94-161 >> 128-257 >> 114-208 >> 129-178 >> 158-223, 241 - before dinner: 110-144, 216 >> 126-180, 211 >> 125-157 >> 140-289 >> 118-137, 156 >> 106-143 >> 107-141 - 2h after dinner: 87-134 >> 130-181 >> 144-193 >> 197 >> 109-188 >> 120-169 >> 160-211 - bedtime: 119-170 >> 96-130, 181 >> 127-171 >> 131-175 >> 162-243 >> 83-164 >> 88, 116-165 >> 118-165, 196 - nighttime:n/c No lows. Lowest sugar was 120 >> 87 >> 87 >> 95 >> 57 x1 (vodka) >> 88 >> 107; he has hypoglycemia awareness - 70. Highest sugar was 200s >> 240 >> 247 >> 190s >> 208 >> 275, 314 >> 251.  Glucometer: One Touch Ultra Mini  Pt's meals are: - Breakfast: 3 days a week, yoghurt  - Lunch: wide variety - Dinner: meat, veggies, strach - Snacks: 1+ - nuts; crackers + PB, chips and salsa  He exercises 5x a week.  - + CKD, last BUN/creatinine:  Lab Results  Component Value Date   BUN 19 03/10/2016   CREATININE 1.19 03/10/2016   - last set of lipids: Lab Results  Component Value Date   CHOL 219 (H) 03/10/2016   HDL 49.80 03/10/2016   LDLCALC 134 (H) 03/10/2016   TRIG 177.0 (H) 03/10/2016   CHOLHDL 4 03/10/2016  On Lipitor. - last eye exam was in 03/28/2015. Dr. Kathrin Penner.No DR.  - no numbness and tingling in his feet.  ROS: Constitutional: no weight gain/loss, no fatigue, no subjective hyperthermia/hypothermia Eyes: no blurry vision, no xerophthalmia ENT: no sore throat, no nodules palpated in throat, no dysphagia/odynophagia, no hoarseness Cardiovascular: no CP/SOB/palpitations/leg swelling Respiratory: no cough/no SOB Gastrointestinal: no N/V/+ D/no C Musculoskeletal: no muscle/joint aches Skin: no rash; no easy bruising Neurological: no tremors/numbness/tingling/dizziness  I reviewed pt's medications, allergies, PMH, social hx, family hx, and changes were documented in the history of present illness. Otherwise, unchanged from my initial visit note:  Past Medical History:  Diagnosis Date  . DM (diabetes mellitus) ([place])   . [name] syndrome 2008   Total bilirubin 1.4  . Hyperlipemia   . Raynaud phenomenon    Past Surgical History:  Procedure Laterality Date  . COLONOSCOPY  2005 & 2010   negative X 2; Dr Watt Climes (due 2020)  . SHOULDER SURGERY     post dislocation sequellae  .  TONSILLECTOMY AND ADENOIDECTOMY     History   Social History  . Marital Status: Married    Spouse Name: N/A  . Number of Children: 3   Occupational History  . Real estate development/construction   Social History Main Topics  . Smoking status: Former Smoker    Quit date: 06/28/1990  . Smokeless tobacco: Not on file     Comment: [id number] 3-4 year . Smoked [phone] , up to 1-2 ppd  . Alcohol Use: 8.4 oz/week    14 Glasses of wine per week     Comment:  socially  . Drug Use: No   Current Outpatient Prescriptions on File Prior to Visit  Medication Sig Dispense Refill  . aspirin  81 MG tablet Take 81 mg by mouth daily.     Marland Kitchen atorvastatin (LIPITOR) 20 MG tablet Take 1 tablet (20 mg total) by mouth daily. 90 tablet 3  . canagliflozin (INVOKANA) 100 MG TABS tablet Take 1 tablet (100 mg total) by mouth daily. 90 tablet 2  . glipiZIDE (GLUCOTROL XL) 5 MG 24 hr tablet take 1 tablet by mouth once daily with BREAKFAST 90 tablet 1  . Lactobacillus-Inulin (CULTURELLE DIGESTIVE HEALTH PO) Take 1 capsule by mouth daily.    . metFORMIN (GLUCOPHAGE-XR) 500 MG 24 hr tablet TAKE 4 TABLETS BY MOUTH  DAILY WITH SUPPER. 360 tablet 0  . Multiple Vitamin (MULTIVITAMIN) tablet Take 1 tablet by mouth daily.    . ONE TOUCH ULTRA TEST test strip USE TO CHECK BLOOD SUGAR 2 TIMES A DAY AS DIRECTED 100 each 5  . saxagliptin HCl (ONGLYZA) 5 MG TABS tablet Take 1 tablet (5 mg total) by mouth daily. 90 tablet 2   No current facility-administered medications on file prior to visit.    No Known Allergies Family History  Problem Relation Age of Onset  . Aneurysm Mother     cns  . Hyperlipidemia Mother   . Lung cancer Father     smoker  . Aneurysm Father     AAA  . Heart attack Sister 42  . Diabetes Brother     type 1  . Lung cancer Paternal Uncle     smoker  . Aneurysm Maternal Grandmother     cns;PRE 45 (mid 40s)  . Diabetes Maternal Grandfather     type 1  . Lung cancer Paternal Grandfather     smoker  . Aneurysm Paternal Grandmother     AAA   PE: BP 130/80 (BP Location: Left Arm, Patient Position: Sitting)   Pulse 61   Ht 5\' 10"  (1.778 m)   Wt 197 lb (89.4 kg)   SpO2 95%   BMI 28.27 kg/m  Body mass index is 28.27 kg/m. Wt Readings from Last 3 Encounters:  04/06/16 197 lb (89.4 kg)  03/15/16 195 lb (88.5 kg)  11/13/15 194 lb (88 kg)   Constitutional: slightly overweight, in NAD Eyes: PERRLA, EOMI, no exophthalmos ENT: moist mucous membranes, no thyromegaly, no cervical lymphadenopathy Cardiovascular: RRR, No MRG Respiratory: CTA B Gastrointestinal: abdomen soft, NT,  ND, BS+ Musculoskeletal: no deformities, strength intact in all 4 Skin: moist, warm, no rashes except rosacea on face Neurological: no tremor with outstretched hands, DTR normal in all 4  ASSESSMENT: 1. DM2, non-insulin-dependent, uncontrolled, with complications - CKD  PLAN:  1. Patient with long-standing, uncontrolled diabetes, on oral antidiabetic regimen, better controlled after adding Glipizide extended-release in a.m. Since he complained of diarrhea in the morning, at last visit I advised him to  take metformin 3x a day. He still has some diarrhea, but not bothersome. Last HbA1c 6.3%, at goal, but sugars are slightly higher than that >> will check a fructosamine. - I suggested to:  Patient Instructions  Please continue:  - Metformin XR 500 mg 3x a day with meals - Invokana 100 mg in am. - Onglyza 5 mg in am - Glipizide XL 5 mg in am, before b'fast.  Please come back for a follow-up appointment in 4 months.  - Continue checking sugars at different times of the day - check 2 times a day, rotating checks - advised for yearly eye exams >> he is UTD - check HbA1c today >> 6.3% (stable) - fructosamine pending.  - Return to clinic in 4 mo with sugar log   Office Visit on 04/06/2016  Component Date Value Ref Range Status  . Fructosamine 04/08/2016 263  190 - 270 umol/L Final  . Hemoglobin A1C 04/06/2016 6.3   Final   The HbA1c calculated from the fructosamine is 6.08%.  Philemon Kingdom, MD PhD [facility] Endocrinology

## 2016-04-08 LAB — HM DIABETES EYE EXAM

## 2016-04-08 LAB — FRUCTOSAMINE: FRUCTOSAMINE: 263 umol/L (ref 190–270)

## 2016-04-13 ENCOUNTER — Ambulatory Visit: Payer: 59

## 2016-04-15 ENCOUNTER — Ambulatory Visit (INDEPENDENT_AMBULATORY_CARE_PROVIDER_SITE_OTHER): Payer: 59 | Admitting: *Deleted

## 2016-04-15 DIAGNOSIS — Z2911 Encounter for prophylactic immunotherapy for respiratory syncytial virus (RSV): Secondary | ICD-10-CM | POA: Diagnosis not present

## 2016-04-15 DIAGNOSIS — Z23 Encounter for immunization: Secondary | ICD-10-CM

## 2016-07-07 ENCOUNTER — Telehealth: Payer: Self-pay | Admitting: Internal Medicine

## 2016-07-07 MED ORDER — CANAGLIFLOZIN 100 MG PO TABS: 100.0000 mg | ORAL_TABLET | Freq: Every day | ORAL | 0 refills | Status: DC

## 2016-07-07 NOTE — Telephone Encounter (Signed)
Refill submitted. Attempted to reach the patient to advise of this but his number was busy and would not connect.

## 2016-07-07 NOTE — Telephone Encounter (Signed)
Pt called and said that his mail order pharmacy will not have his Invokana sent to him in time for his out of town trip, he needs about a 2 weeks supply sent to the [facility] on [address] near the [place] (in his snapshot).  He is leaving tomorrow and needs this done as soon as possible please.

## 2016-08-06 ENCOUNTER — Encounter: Payer: Self-pay | Admitting: Internal Medicine

## 2016-08-06 ENCOUNTER — Ambulatory Visit (INDEPENDENT_AMBULATORY_CARE_PROVIDER_SITE_OTHER): Payer: 59 | Admitting: Internal Medicine

## 2016-08-06 VITALS — BP 120/78 | HR 72 | Ht 70.5 in | Wt 201.0 lb

## 2016-08-06 DIAGNOSIS — E1121 Type 2 diabetes mellitus with diabetic nephropathy: Secondary | ICD-10-CM | POA: Diagnosis not present

## 2016-08-06 LAB — POCT GLYCOSYLATED HEMOGLOBIN (HGB A1C): Hemoglobin A1C: 6.5

## 2016-08-06 MED ORDER — FREESTYLE LIBRE SENSOR SYSTEM MISC: 1.0000 | 11 refills | Status: DC

## 2016-08-06 MED ORDER — FREESTYLE LIBRE READER DEVI: 1.0000 | Freq: Three times a day (TID) | 1 refills | Status: DC

## 2016-08-06 NOTE — Patient Instructions (Addendum)
Please continue:  - Metformin XR 1000 mg 2x a day with meals - Invokana 100 mg in am. - Onglyza 5 mg in am - Glipizide XL 5 mg in am, before b'fast.  Please come back for a follow-up appointment in 4 months.

## 2016-08-06 NOTE — Progress Notes (Signed)
Patient ID: Luke West, male   DOB: 1955/10/24, 61 y.o.   MRN: [id number]  HPI: Luke West is a 61 y.o.-year-old male, returning for follow-up for DM2, dx in 2003, non-insulin-dependent, uncontrolled, with complications (CKD). Last visit 4 mo ago.  His HbA1c levels are at goal, however, sugars from his log are usually higher: 04/06/2016: HbA1c continuity from fructosamine: 6.08% Lab Results  Component Value Date   HGBA1C 6.3 04/06/2016   HGBA1C 6.3 11/13/2015   HGBA1C 6.5 07/16/2015   Pt is on a regimen of: - Invokana 100 mg in am. - Onglyza 5 mg in am - Metformin XR 2000 mg with dinner >> stopped for a period of time 2/2 diarrhea >> now 2000 mg a day - Glipizide XL 5 mg in am Previously on JanuMet and Kombiglyze.  Pt checks his sugars 2-4 a day and they are: - am: 123-184 >> 143-187, 206 >> 109-184, 208 >> 115-204 >> 145-205, 231 - 2h after b'fast: 150, 167-283 >> 141-174 >> 128-231 >> 161, 251 >> 144, 226, 262 - before lunch: 142 >> 120-140 >> 138-175 >> 142, 169 >> 188-228 - 2h after lunch:128-257 >> 114-208 >> 129-178 >> 158-223, 241 >> 162-222, 301 - before dinner: 140-289 >> 118-137, 156 >> 106-143 >> 107-141 >> 102-156 - 2h after dinner: 197 >> 109-188 >> 120-169 >> 160-211 >> 106-204 - bedtime: 162-243 >> 83-164 >> 88, 116-165 >> 118-165, 196 >> 89-207, 248 - nighttime:n/c No lows. Lowest sugar was 57 x1 (vodka) >> 88 >> 107; he has hypoglycemia awareness - 70. Highest sugar was 251.  Glucometer: One Touch Ultra Mini  Pt's meals are: - Breakfast: 3 days a week, yoghurt  - Lunch: wide variety - Dinner: meat, veggies, strach - Snacks: 1+ - nuts; crackers + PB, chips and salsa  He exercises 5x a week.  - + CKD, last BUN/creatinine:  Lab Results  Component Value Date   BUN 19 03/10/2016   CREATININE 1.19 03/10/2016   - last set of lipids: Lab Results  Component Value Date   CHOL 219 (H) 03/10/2016   HDL 49.80 03/10/2016   LDLCALC 134 (H) 03/10/2016   TRIG 177.0 (H) 03/10/2016   CHOLHDL 4 03/10/2016  On Lipitor. - last eye exam was by Dr. Kathrin Penner: Component Date Value Ref Range Status  . HM Diabetic Eye Exam 04/08/2016 No Retinopathy  No Retinopathy Final    - no numbness and tingling in his feet.  He has a history of Raynaud's phenomenon.  ROS: Constitutional: + weight gain, no fatigue, no subjective hyperthermia/hypothermia,+  Poor sleep Eyes: no blurry vision, no xerophthalmia ENT: no sore throat, no nodules palpated in throat, no dysphagia/odynophagia, no hoarseness, + decrease hearing Cardiovascular: no CP/SOB/palpitations/leg swelling Respiratory: no cough/no SOB Gastrointestinal: no N/V/+ D/no C Musculoskeletal: no muscle/joint aches Skin: no rash; no easy bruising Neurological: no tremors/numbness/tingling/dizziness  I reviewed pt's medications, allergies, PMH, social hx, family hx, and changes were documented in the history of present illness. Otherwise, unchanged from my initial visit note:  Past Medical History:  Diagnosis Date  . DM (diabetes mellitus) ([place])   . [name] syndrome 2008   Total bilirubin 1.4  . Hyperlipemia   . Raynaud phenomenon    Past Surgical History:  Procedure Laterality Date  . COLONOSCOPY  2005 & 2010   negative X 2; Dr Watt Climes (due 2020)  . SHOULDER SURGERY     post dislocation sequellae  . TONSILLECTOMY AND ADENOIDECTOMY     History  Social History  . Marital Status: Married    Spouse Name: N/A  . Number of Children: 3   Occupational History  . Real estate development/construction   Social History Main Topics  . Smoking status: Former Smoker    Quit date: 06/28/1990  . Smokeless tobacco: Not on file     Comment: [id number] 3-4 year . Smoked [phone] , up to 1-2 ppd  . Alcohol Use: 8.4 oz/week    14 Glasses of wine per week     Comment:  socially  . Drug Use: No   Current Outpatient Prescriptions on File Prior to Visit  Medication Sig Dispense Refill  . aspirin 81  MG tablet Take 81 mg by mouth daily.     Marland Kitchen atorvastatin (LIPITOR) 20 MG tablet Take 1 tablet (20 mg total) by mouth daily. 90 tablet 3  . canagliflozin (INVOKANA) 100 MG TABS tablet Take 1 tablet (100 mg total) by mouth daily. 14 tablet 0  . glipiZIDE (GLUCOTROL XL) 5 MG 24 hr tablet take 1 tablet by mouth once daily with BREAKFAST 90 tablet 3  . Lactobacillus-Inulin (CULTURELLE DIGESTIVE HEALTH PO) Take 1 capsule by mouth daily.    . metFORMIN (GLUCOPHAGE-XR) 500 MG 24 hr tablet TAKE 4 TABLETS BY MOUTH  DAILY WITH SUPPER. 360 tablet 0  . Multiple Vitamin (MULTIVITAMIN) tablet Take 1 tablet by mouth daily.    . ONE TOUCH ULTRA TEST test strip USE TO CHECK BLOOD SUGAR 2 TIMES A DAY AS DIRECTED 100 each 5  . saxagliptin HCl (ONGLYZA) 5 MG TABS tablet Take 1 tablet (5 mg total) by mouth daily. 90 tablet 2   No current facility-administered medications on file prior to visit.    No Known Allergies Family History  Problem Relation Age of Onset  . Aneurysm Mother     cns  . Hyperlipidemia Mother   . Lung cancer Father     smoker  . Aneurysm Father     AAA  . Heart attack Sister 26  . Diabetes Brother     type 1  . Lung cancer Paternal Uncle     smoker  . Aneurysm Maternal Grandmother     cns;PRE 11 (mid 89s)  . Diabetes Maternal Grandfather     type 1  . Lung cancer Paternal Grandfather     smoker  . Aneurysm Paternal Grandmother     AAA   PE: BP 120/78 (BP Location: Left Arm, Patient Position: Sitting)   Pulse 72   SpO2 94%  There is no height or weight on file to calculate BMI. Wt Readings from Last 3 Encounters:  04/06/16 197 lb (89.4 kg)  03/15/16 195 lb (88.5 kg)  11/13/15 194 lb (88 kg)   Constitutional: slightly overweight, in NAD Eyes: PERRLA, EOMI, no exophthalmos ENT: moist mucous membranes, no thyromegaly, no cervical lymphadenopathy Cardiovascular: RRR, No MRG Respiratory: CTA B Gastrointestinal: abdomen soft, NT, ND, BS+ Musculoskeletal: no deformities,  strength intact in all 4 Skin: moist, warm, no rashes except rosacea on face Neurological: no tremor with outstretched hands, DTR normal in all 4  ASSESSMENT: 1. DM2, non-insulin-dependent, uncontrolled, with complications - CKD  PLAN:  1. Patient with long-standing, uncontrolled diabetes, on oral antidiabetic regimen, better controlled after adding Glipizide extended-release in a.m. His sugars are worse c/w before b/c of the Holidays.  - At last visit, his HbA1c was 6.3% (stable), and an HbA1c calculated from fructosamine was 6.08% (concordant), but the HbA1c is lower than expected from his log.  I wonder if his Raynaud's phenomenon may influence his sugars >> I suggested a Libre CGM >> he agrees to try it >> sent to his pharmacy - I suggested to:  Patient Instructions  Please continue:  - Metformin XR 500 mg 3x a day with meals - Invokana 100 mg in am. - Onglyza 5 mg in am - Glipizide XL 5 mg in am, before b'fast.  Please come back for a follow-up appointment in 4 months.  - Continue checking sugars at different times of the day - check 2 times a day, rotating checks - advised for yearly eye exams >> he is UTD - check HbA1c today >> 6.5% (slightly higher) - Return to clinic in 4 mo with sugar log   Philemon Kingdom, MD PhD [facility] Endocrinology

## 2016-08-06 NOTE — Addendum Note (Signed)
Addended by: Caprice Beaver T on: 08/06/2016 11:06 AM   Modules accepted: Orders

## 2016-08-13 ENCOUNTER — Other Ambulatory Visit (INDEPENDENT_AMBULATORY_CARE_PROVIDER_SITE_OTHER): Payer: 59

## 2016-08-13 DIAGNOSIS — E1121 Type 2 diabetes mellitus with diabetic nephropathy: Secondary | ICD-10-CM

## 2016-08-13 DIAGNOSIS — E782 Mixed hyperlipidemia: Secondary | ICD-10-CM | POA: Diagnosis not present

## 2016-08-13 DIAGNOSIS — Z1159 Encounter for screening for other viral diseases: Secondary | ICD-10-CM | POA: Diagnosis not present

## 2016-08-13 DIAGNOSIS — Z114 Encounter for screening for human immunodeficiency virus [HIV]: Secondary | ICD-10-CM

## 2016-08-13 LAB — MICROALBUMIN / CREATININE URINE RATIO
Creatinine,U: 78.7 mg/dL
Microalb Creat Ratio: 0.9 mg/g (ref 0.0–30.0)

## 2016-08-13 LAB — LIPID PANEL
CHOLESTEROL: 176 mg/dL (ref 0–200)
HDL: 42.9 mg/dL (ref >39.00)
LDL CALC: 98 mg/dL (ref 0–99)
NonHDL: 132.98
Total CHOL/HDL Ratio: 4
Triglycerides: 176 mg/dL — ABNORMAL HIGH (ref 0.0–149.0)
VLDL: 35.2 mg/dL (ref 0.0–40.0)

## 2016-08-13 LAB — HEPATITIS C ANTIBODY: HCV Ab: NEGATIVE

## 2016-08-14 ENCOUNTER — Encounter: Payer: Self-pay | Admitting: Internal Medicine

## 2016-08-14 LAB — HIV ANTIBODY (ROUTINE TESTING W REFLEX): HIV 1&2 Ab, 4th Generation: NONREACTIVE

## 2016-11-24 ENCOUNTER — Encounter: Payer: Self-pay | Admitting: Internal Medicine

## 2016-11-24 ENCOUNTER — Ambulatory Visit (INDEPENDENT_AMBULATORY_CARE_PROVIDER_SITE_OTHER): Payer: 59 | Admitting: Internal Medicine

## 2016-11-24 VITALS — BP 122/78 | HR 58 | Ht 70.0 in | Wt 194.0 lb

## 2016-11-24 DIAGNOSIS — E1121 Type 2 diabetes mellitus with diabetic nephropathy: Secondary | ICD-10-CM | POA: Diagnosis not present

## 2016-11-24 LAB — POCT GLYCOSYLATED HEMOGLOBIN (HGB A1C): HEMOGLOBIN A1C: 7.2

## 2016-11-24 MED ORDER — GLIPIZIDE ER 5 MG PO TB24: ORAL_TABLET | ORAL | 3 refills | Status: DC

## 2016-11-24 NOTE — Progress Notes (Signed)
Patient ID: Luke West, male   DOB: [birth year], 61 y.o.   MRN: [id number]  HPI: Luke West is a 61 y.o.-year-old male, returning for follow-up for DM2, dx in 2003, non-insulin-dependent, uncontrolled, with complications (CKD). Last visit 4 mo ago:  Reviewed latest HbA1c levels: Lab Results  Component Value Date   HGBA1C 6.5 08/06/2016   HGBA1C 6.3 04/06/2016   HGBA1C 6.3 11/13/2015  04/06/2016: HbA1c calculated from fructosamine: 6.08%  Pt is on a regimen of: - Invokana 100 mg in am. - Onglyza 5 mg in am - Metformin XR 2000 mg with dinner >> 1000 mg with L and D - Glipizide XL 5 mg in am Previously on JanuMet and Kombiglyze.  Pt checks his sugars with the Libre CGM >> ave 146. Above target: 50% In target: 48% Below target: 2%  Per CGM download (will scan) - am:  115-204 >> 145-205, 231 >> 100-160 - post exercise: 125-250, 300s this am - 2h after b'fast: 161, 251 >> 144, 226, 262 >> n/a - before lunch: 142, 169 >> 188-228 >> 120 - 180 - 2h after lunch: 158-223, 241 >> 162-222, 301 >> 110-210 - before dinner:  107-141 >> 102-156 >> 100-140 - 2h after dinner: 160-211 >> 106-204 >> 130-250 - bedtime: 118-165, 196 >> 89-207, 248 >> 80-160 - nighttime:n/c No lows. Lowest sugar was 57 x1 (vodka) >> 88 >> 107 >> 80s; he has hypoglycemia awareness - 70. Highest sugar was 251 >> 300s x1.  Glucometer: One Touch Ultra Mini  Pt's meals are: - Breakfast: 3 days a week, yoghurt  - Lunch: wide variety - Dinner: meat, veggies, strach - Snacks: 1+ - nuts; crackers + PB, chips and salsa  He exercises 5x a week.  - He had mild CKD, last BUN/creatinine:  Lab Results  Component Value Date   BUN 19 03/10/2016   CREATININE 1.19 03/10/2016   - last set of lipids: Lab Results  Component Value Date   CHOL 176 08/13/2016   HDL 42.90 08/13/2016   LDLCALC 98 08/13/2016   TRIG 176.0 (H) 08/13/2016   CHOLHDL 4 08/13/2016  On Lipitor. - last eye exam was by Dr. Kathrin Penner -  reviewed result: Component Date Value Ref Range Status  . HM Diabetic Eye Exam 04/08/2016 No Retinopathy  No Retinopathy Final    - Denies numbness and tingling in his feet.  He has a history of Raynaud's phenomenon.  ROS: Constitutional: + weight loss, no fatigue, no subjective hyperthermia, no subjective hypothermia Eyes: no blurry vision, no xerophthalmia ENT: no sore throat, no nodules palpated in throat, no dysphagia, no odynophagia, no hoarseness Cardiovascular: no CP/no SOB/no palpitations/no leg swelling Respiratory: no cough/no SOB/no wheezing Gastrointestinal: no N/no V/no D/no C/no acid reflux Musculoskeletal: no muscle aches/no joint aches Skin: no rashes, no hair loss Neurological: no tremors/no numbness/no tingling/no dizziness  I reviewed pt's medications, allergies, PMH, social hx, family hx, and changes were documented in the history of present illness. Otherwise, unchanged from my initial visit note.   Past Medical History:  Diagnosis Date  . DM (diabetes mellitus) ([place])   . [name] syndrome 2008   Total bilirubin 1.4  . Hyperlipemia   . Raynaud phenomenon    Past Surgical History:  Procedure Laterality Date  . COLONOSCOPY  2005 & 2010   negative X 2; Dr Watt Climes (due 2020)  . SHOULDER SURGERY     post dislocation sequellae  . TONSILLECTOMY AND ADENOIDECTOMY     History   Social  History  . Marital Status: Married    Spouse Name: N/A  . Number of Children: 3   Occupational History  . Real estate development/construction   Social History Main Topics  . Smoking status: Former Smoker    Quit date: 06/28/1990  . Smokeless tobacco: Not on file     Comment: [id number] 3-4 year . Smoked [phone] , up to 1-2 ppd  . Alcohol Use: 8.4 oz/week    14 Glasses of wine per week     Comment:  socially  . Drug Use: No   Current Outpatient Prescriptions on File Prior to Visit  Medication Sig Dispense Refill  . aspirin 81 MG tablet Take 81 mg by mouth daily.      Marland Kitchen atorvastatin (LIPITOR) 20 MG tablet Take 1 tablet (20 mg total) by mouth daily. 90 tablet 3  . canagliflozin (INVOKANA) 100 MG TABS tablet Take 1 tablet (100 mg total) by mouth daily. 14 tablet 0  . Continuous Blood Gluc Receiver (FREESTYLE LIBRE READER) DEVI 1 Device by Does not apply route 3 (three) times daily. 1 Device 1  . Continuous Blood Gluc Sensor (FREESTYLE LIBRE SENSOR SYSTEM) MISC 1 Device by Does not apply route every 30 (thirty) days. 3 each 11  . glipiZIDE (GLUCOTROL XL) 5 MG 24 hr tablet take 1 tablet by mouth once daily with BREAKFAST 90 tablet 3  . Lactobacillus-Inulin (CULTURELLE DIGESTIVE HEALTH PO) Take 1 capsule by mouth daily.    . metFORMIN (GLUCOPHAGE-XR) 500 MG 24 hr tablet TAKE 4 TABLETS BY MOUTH  DAILY WITH SUPPER. 360 tablet 0  . Multiple Vitamin (MULTIVITAMIN) tablet Take 1 tablet by mouth daily.    . ONE TOUCH ULTRA TEST test strip USE TO CHECK BLOOD SUGAR 2 TIMES A DAY AS DIRECTED 100 each 5  . saxagliptin HCl (ONGLYZA) 5 MG TABS tablet Take 1 tablet (5 mg total) by mouth daily. 90 tablet 2   No current facility-administered medications on file prior to visit.    No Known Allergies Family History  Problem Relation Age of Onset  . Aneurysm Mother        cns  . Hyperlipidemia Mother   . Lung cancer Father        smoker  . Aneurysm Father        AAA  . Heart attack Sister 25  . Diabetes Brother        type 1  . Lung cancer Paternal Uncle        smoker  . Aneurysm Maternal Grandmother        cns;PRE 20 (mid 52s)  . Diabetes Maternal Grandfather        type 1  . Lung cancer Paternal Grandfather        smoker  . Aneurysm Paternal Grandmother        AAA   PE: BP 122/78 (BP Location: Left Arm, Patient Position: Sitting)   Pulse (!) 58   Ht 5\' 10"  (1.778 m)   Wt 194 lb (88 kg)   SpO2 97%   BMI 27.84 kg/m  Body mass index is 27.84 kg/m. Wt Readings from Last 3 Encounters:  11/24/16 194 lb (88 kg)  08/06/16 201 lb (91.2 kg)  04/06/16 197 lb  (89.4 kg)   Constitutional: slightly overweight, in NAD Eyes: PERRLA, EOMI, no exophthalmos ENT: moist mucous membranes, no thyromegaly, no cervical lymphadenopathy Cardiovascular: RRR, No MRG Respiratory: CTA B Gastrointestinal: abdomen soft, NT, ND, BS+ Musculoskeletal: no deformities, strength intact in all 4  Skin: moist, warm, no rashes except rosacea on face Neurological: no tremor with outstretched hands, DTR normal in all 4  ASSESSMENT: 1. DM2, non-insulin-dependent, uncontrolled, with complications - CKD  PLAN:  1. Patient with long-standing, uncontrolled DM, on oral DM medication regimen, with higher sugars at this visit. He especially has hyperglycemia after exercise in am despite the fact that he is taking his am meds before exercise w/out eating. We reviewed together the CGM download >> sugars are great overnight, slightly higher at waking up but increase into 200s and even this am: 300s aftr exercise (cardio).  - Will try to increase Glipizide before exercise, but I first advised him to try to get a low carb snack before exercise as this may help decrease his postexercise sugars - I suggested to:  Patient Instructions  Please continue:  - Metformin XR 500 mg 3x a day with meals - Invokana 100 mg in am. - Onglyza 5 mg in am - Glipizide XL 5 mg in am, before b'fast.  Please add a second Glipizide tablet of 5 mg in am (crushed).  Try to get a small snack before exercise (no more than 15 g carbs).  Please come back for a follow-up appointment in 4 months.  - today, HbA1c is 7.2% (higher)  - continue checking sugars at different times of the day - check 1-2x a day, rotating checks - advised for yearly eye exams >> he is UTD - Return to clinic in 4 mo with sugar log    Philemon Kingdom, MD PhD [facility] Endocrinology

## 2016-11-24 NOTE — Patient Instructions (Addendum)
Please continue:  - Metformin XR 500 mg 3x a day with meals - Invokana 100 mg in am. - Onglyza 5 mg in am - Glipizide XL 5 mg in am, before b'fast.  Please add a second Glipizide tablet of 5 mg in am (crushed).  Try to get a small snack before exercise (no more than 15 g carbs).  Please come back for a follow-up appointment in 4 months.

## 2016-11-24 NOTE — Addendum Note (Signed)
Addended by: Caprice Beaver T on: 11/24/2016 11:27 AM   Modules accepted: Orders

## 2016-12-03 ENCOUNTER — Ambulatory Visit: Payer: 59 | Admitting: Internal Medicine

## 2016-12-08 ENCOUNTER — Other Ambulatory Visit: Payer: Self-pay | Admitting: Internal Medicine

## 2017-01-03 ENCOUNTER — Other Ambulatory Visit: Payer: Self-pay | Admitting: Internal Medicine

## 2017-03-16 DIAGNOSIS — Z125 Encounter for screening for malignant neoplasm of prostate: Secondary | ICD-10-CM | POA: Diagnosis not present

## 2017-03-23 DIAGNOSIS — N401 Enlarged prostate with lower urinary tract symptoms: Secondary | ICD-10-CM | POA: Diagnosis not present

## 2017-03-28 ENCOUNTER — Ambulatory Visit: Payer: 59 | Admitting: Internal Medicine

## 2017-04-14 DIAGNOSIS — E119 Type 2 diabetes mellitus without complications: Secondary | ICD-10-CM | POA: Diagnosis not present

## 2017-04-14 LAB — HM DIABETES EYE EXAM

## 2017-04-16 ENCOUNTER — Encounter: Payer: Self-pay | Admitting: Internal Medicine

## 2017-04-19 ENCOUNTER — Encounter: Payer: Self-pay | Admitting: Internal Medicine

## 2017-05-03 ENCOUNTER — Encounter: Payer: Self-pay | Admitting: Internal Medicine

## 2017-05-03 ENCOUNTER — Ambulatory Visit: Payer: 59 | Admitting: Internal Medicine

## 2017-05-03 VITALS — BP 122/82 | HR 54 | Wt 195.0 lb

## 2017-05-03 DIAGNOSIS — E1121 Type 2 diabetes mellitus with diabetic nephropathy: Secondary | ICD-10-CM | POA: Diagnosis not present

## 2017-05-03 DIAGNOSIS — Z23 Encounter for immunization: Secondary | ICD-10-CM

## 2017-05-03 LAB — POCT GLYCOSYLATED HEMOGLOBIN (HGB A1C): HEMOGLOBIN A1C: 6.5

## 2017-05-03 MED ORDER — METFORMIN HCL ER 500 MG PO TB24: ORAL_TABLET | ORAL | 3 refills | Status: DC

## 2017-05-03 MED ORDER — GLIPIZIDE 5 MG PO TABS: 5.0000 mg | ORAL_TABLET | Freq: Every day | ORAL | 3 refills | Status: DC

## 2017-05-03 MED ORDER — GLIPIZIDE ER 5 MG PO TB24: ORAL_TABLET | ORAL | 3 refills | Status: DC

## 2017-05-03 NOTE — Addendum Note (Signed)
Addended by: Caprice Beaver T on: 05/03/2017 04:29 PM   Modules accepted: Orders

## 2017-05-03 NOTE — Progress Notes (Signed)
Patient ID: Luke West, male   DOB: February 05, 1956, 61 y.o.   MRN: [id number]  HPI: Luke West is a 61 y.o.-year-old male, returning for follow-up for DM2, dx in 2003, non-insulin-dependent, uncontrolled, with complications (CKD). Last visit 5.5 mo ago.  Reviewed latest HbA1c levels: Lab Results  Component Value Date   HGBA1C 7.2 11/24/2016   HGBA1C 6.5 08/06/2016   HGBA1C 6.3 04/06/2016  04/06/2016: HbA1c calculated from fructosamine: 6.08%  Pt is on a regimen of: - Invokana 100 mg in am. - Onglyza 5 mg in am - Metformin XR 2000 mg with dinner >> 1000 mg with L and D - Glipizide XL 5 mg in am >> 10 mg in am Previously on JanuMet and Kombiglyze.  Pt checks his sugars with the Libre CGM >> ave 146 >> 158 +/- 47.4 (coeff of var. 30%). Above target: 50% >> 27% In target: 48% >> 72% Below target: 2% >> 1%  Per CGM download (will scan): - am:  115-204 >> 145-205, 231 >> 100-160 >> 100-150 - post exercise: 125-250, 300s this am - 2h after b'fast: 161, 251 >> 144, 226, 262 >> n/a >> 150-220 (10-11 am, after exercise) - before lunch: 142, 169 >> 188-228 >> 120 - 180 >> 150-200 - 2h after lunch: 158-223, 241 >> 162-222, 301 >> 110-210 >> 140-160 - before dinner:  107-141 >> 102-156 >> 100-140 >> 120-150 - 2h after dinner: 160-211 >> 106-204 >> 130-250 >> not enough data - bedtime: 118-165, 196 >> 89-207, 248 >> 80-160 >> 100-150 - nighttime:n/c Lowest sugar was 57 x1 (vodka) >> 88 >> 107 >> 80s >> 41 before dinner; he has hypoglycemia awareness - 70. Highest sugar was 251 >> 300s x1 >> 261.  Glucometer: One Touch Ultra Mini  Pt's meals are: - Breakfast: 3 days a week, yoghurt  - Lunch: wide variety - Dinner: meat, veggies, strach - Snacks: 1+ - nuts; crackers + PB, chips and salsa  He exercises 5x a week.  - + mild CKD, last BUN/creatinine:  Lab Results  Component Value Date   BUN 19 03/10/2016   CREATININE 1.19 03/10/2016   Lab Results  Component Value Date    GFRNONAA 72.64 03/05/2010   GFRNONAA 67.46 09/11/2008   GFRNONAA 52 08/21/2007   GFRNONAA 75 08/22/2006   Lab Results  Component Value Date   MICRALBCREAT 0.9 08/13/2016   MICRALBCREAT 0.8 07/17/2014   MICRALBCREAT 0.7 08/10/2013   MICRALBCREAT 0.8 03/02/2012   MICRALBCREAT 1.7 10/27/2011   MICRALBCREAT 1.1 07/26/2011   MICRALBCREAT 0.9 03/05/2010   MICRALBCREAT 2.8 10/10/2008   MICRALBCREAT 29.8 03/21/2008   MICRALBCREAT 3.0 08/22/2006   - last set of lipids: Lab Results  Component Value Date   CHOL 176 08/13/2016   HDL 42.90 08/13/2016   LDLCALC 98 08/13/2016   TRIG 176.0 (H) 08/13/2016   CHOLHDL 4 08/13/2016  On Lipitor - last eye exam was by Dr. Kathrin Penner - 04/14/2017: No DR  - No numbness and tingling in his feet.  He has a history of Raynaud's phenomenon.  ROS: Constitutional: no weight gain/no weight loss, no fatigue, no subjective hyperthermia, no subjective hypothermia Eyes: no blurry vision, no xerophthalmia ENT: no sore throat, no nodules palpated in throat, no dysphagia, no odynophagia, no hoarseness Cardiovascular: no CP/no SOB/no palpitations/no leg swelling Respiratory: no cough/no SOB/no wheezing Gastrointestinal: no N/no V/no D/no C/no acid reflux Musculoskeletal: no muscle aches/no joint aches Skin: no rashes, no hair loss Neurological: no tremors/no numbness/no tingling/no dizziness  I reviewed pt's medications, allergies, PMH, social hx, family hx, and changes were documented in the history of present illness. Otherwise, unchanged from my initial visit note.   Past Medical History:  Diagnosis Date  . DM (diabetes mellitus) ([place])   . [name] syndrome 2008   Total bilirubin 1.4  . Hyperlipemia   . Raynaud phenomenon    Past Surgical History:  Procedure Laterality Date  . COLONOSCOPY  2005 & 2010   negative X 2; Dr Watt Climes (due 2020)  . SHOULDER SURGERY     post dislocation sequellae  . TONSILLECTOMY AND ADENOIDECTOMY     History    Social History  . Marital Status: Married    Spouse Name: N/A  . Number of Children: 3   Occupational History  . Real estate development/construction   Social History Main Topics  . Smoking status: Former Smoker    Quit date: 06/28/1990  . Smokeless tobacco: Not on file     Comment: XNA:TFTDD 3-4 year . Smoked [phone] , up to 1-2 ppd  . Alcohol Use: 8.4 oz/week    14 Glasses of wine per week     Comment:  socially  . Drug Use: No   Current Outpatient Medications on File Prior to Visit  Medication Sig Dispense Refill  . aspirin 81 MG tablet Take 81 mg by mouth daily.     Marland Kitchen atorvastatin (LIPITOR) 20 MG tablet Take 1 tablet (20 mg total) by mouth daily. 90 tablet 3  . canagliflozin (INVOKANA) 100 MG TABS tablet Take 1 tablet (100 mg total) by mouth daily. 14 tablet 0  . Continuous Blood Gluc Receiver (FREESTYLE LIBRE READER) DEVI 1 Device by Does not apply route 3 (three) times daily. 1 Device 1  . Continuous Blood Gluc Sensor (FREESTYLE LIBRE SENSOR SYSTEM) MISC 1 Device by Does not apply route every 30 (thirty) days. 3 each 11  . glipiZIDE (GLUCOTROL XL) 5 MG 24 hr tablet take 2 tablet by mouth once daily before BREAKFAST 180 tablet 3  . INVOKANA 100 MG TABS tablet TAKE 1 TABLET BY MOUTH  DAILY 90 tablet 2  . Lactobacillus-Inulin (CULTURELLE DIGESTIVE HEALTH PO) Take 1 capsule by mouth daily.    . metFORMIN (GLUCOPHAGE-XR) 500 MG 24 hr tablet TAKE 4 TABLETS BY MOUTH  DAILY WITH SUPPER 360 tablet 1  . Multiple Vitamin (MULTIVITAMIN) tablet Take 1 tablet by mouth daily.    . ONE TOUCH ULTRA TEST test strip USE TO CHECK BLOOD SUGAR 2 TIMES A DAY AS DIRECTED 100 each 5  . ONGLYZA 5 MG TABS tablet TAKE 1 TABLET BY MOUTH  DAILY 90 tablet 2   No current facility-administered medications on file prior to visit.    No Known Allergies Family History  Problem Relation Age of Onset  . Aneurysm Mother        cns  . Hyperlipidemia Mother   . Lung cancer Father        smoker  .  Aneurysm Father        AAA  . Heart attack Sister 85  . Diabetes Brother        type 1  . Lung cancer Paternal Uncle        smoker  . Aneurysm Maternal Grandmother        cns;PRE 76 (mid 51s)  . Diabetes Maternal Grandfather        type 1  . Lung cancer Paternal Grandfather        smoker  . Aneurysm  Paternal Grandmother        AAA   PE: BP 122/82 (BP Location: Left Arm, Patient Position: Sitting)   Pulse (!) 54   Wt 195 lb (88.5 kg)   SpO2 97%   BMI 27.98 kg/m  Body mass index is 27.98 kg/m. Wt Readings from Last 3 Encounters:  05/03/17 195 lb (88.5 kg)  11/24/16 194 lb (88 kg)  08/06/16 201 lb (91.2 kg)   Constitutional: overweight, in NAD Eyes: PERRLA, EOMI, no exophthalmos ENT: moist mucous membranes, no thyromegaly, no cervical lymphadenopathy Cardiovascular: RRR, No MRG Respiratory: CTA B Gastrointestinal: abdomen soft, NT, ND, BS+ Musculoskeletal: no deformities, strength intact in all 4 Skin: moist, warm, no rashes Neurological: no tremor with outstretched hands, DTR normal in all 4  ASSESSMENT: 1. DM2, non-insulin-dependent, uncontrolled, with complications - CKD  PLAN:  1. Patient with long-standing, uncontrolled diabetes, on oral antidiabetic regimen, with fairly well-controlled blood sugars during the day, but a large hyperglycemic peak after exercise (regardless whether this is cardio or strength training, which he alternates).  He tells me that his sugars have been doing this for his entire life.  At last visit, we discussed about trying to things to abate the post exercise hypoglycemia:  - Add a crushed Glipizide tablet 5 mg in a.m. (he added another Glipizide XL 5 mg for a total of 10 mg in a.m.).  Subsequently, his sugars are not better after exercise but he may have lows later in the day, for example before dinner, when he had a 41 CBG 1 day and also 60s in other days.  At this visit, I advised him to reduce the glipizide XL to 5 mg and the regular  glipizide 5 mg before exercise. - We also discussed about adding a 15 g of carb snack before exercise to avoid gluconeogenesis and subsequent hyperglycemia.  He did not try this.  I did advise him to try this if he feels that adding the glipizide as discussed above does not help  - We also discussed about the possibility of adding Trulicity, but I do not feel this is her first choice since he only has 1 peak during the day and that happens with exercise and not with eating. - I suggested to:  Patient Instructions  Please continue:  - Metformin XR 500 mg 3x a day with meals - Invokana 100 mg in am. - Onglyza 5 mg in am  Decrease: - Glipizide XL to 5 mg in am, before b'fast.  Add:  Glipizide 5 mg in am, before b'fast  You may need a 15g carb snack before exercise.  Please come back for a follow-up appointment in 4 months.  - today, HbA1c is 6.5% (better) - continue checking sugars at different times of the day - check 1-2x a day, rotating checks - advised for yearly eye exams >> he is UTD - + flu shot today  - He will need a BMP - has an appointment with PCP in 3 months - Return to clinic in 4 mo with sugar log   - time spent with the patient: 25 min, of which >50% was spent in reviewing his CGM traces downloads, discussing his hypo- and hyper-glycemic episodes, reviewing  previous labs and med doses and developing a plan to avoid hypo- and hyper-glycemia.   Philemon Kingdom, MD PhD [facility]

## 2017-05-03 NOTE — Patient Instructions (Addendum)
Please continue:  - Metformin XR 500 mg 3x a day with meals - Invokana 100 mg in am. - Onglyza 5 mg in am  Decrease: - Glipizide XL to 5 mg in am, before b'fast.  Add:  Glipizide 5 mg in am, before b'fast  You may need a 15g carb snack before exercise.  Please come back for a follow-up appointment in 4 months.

## 2017-05-11 ENCOUNTER — Other Ambulatory Visit: Payer: Self-pay

## 2017-05-11 MED ORDER — GLIPIZIDE ER 5 MG PO TB24: ORAL_TABLET | ORAL | 3 refills | Status: DC

## 2017-05-12 ENCOUNTER — Other Ambulatory Visit: Payer: Self-pay

## 2017-05-12 MED ORDER — GLIPIZIDE 5 MG PO TABS: 5.0000 mg | ORAL_TABLET | Freq: Every day | ORAL | 3 refills | Status: DC

## 2017-05-13 ENCOUNTER — Encounter: Payer: Self-pay | Admitting: Internal Medicine

## 2017-05-17 ENCOUNTER — Other Ambulatory Visit: Payer: Self-pay

## 2017-05-17 MED ORDER — GLIPIZIDE 5 MG PO TABS: 5.0000 mg | ORAL_TABLET | Freq: Every day | ORAL | 3 refills | Status: DC

## 2017-05-30 ENCOUNTER — Telehealth: Payer: Self-pay

## 2017-05-30 NOTE — Telephone Encounter (Signed)
-----   Message from Philemon Kingdom, MD sent at 05/30/2017 10:55 AM EST ----- Loma Sousa, I sent a reply msg to this pt some time ago and he did not read it >> can you please check on him  - he was having high sugars >> please read him my message. Ty, C

## 2017-05-30 NOTE — Telephone Encounter (Signed)
Pt stated that it only lasted 3 days but it has subsided because he did have a cold at that time as well.

## 2017-06-07 DIAGNOSIS — D225 Melanocytic nevi of trunk: Secondary | ICD-10-CM | POA: Diagnosis not present

## 2017-06-07 DIAGNOSIS — L821 Other seborrheic keratosis: Secondary | ICD-10-CM | POA: Diagnosis not present

## 2017-06-07 DIAGNOSIS — L814 Other melanin hyperpigmentation: Secondary | ICD-10-CM | POA: Diagnosis not present

## 2017-06-11 ENCOUNTER — Other Ambulatory Visit: Payer: Self-pay | Admitting: Internal Medicine

## 2017-06-12 ENCOUNTER — Other Ambulatory Visit: Payer: Self-pay | Admitting: Internal Medicine

## 2017-06-13 ENCOUNTER — Telehealth: Payer: Self-pay | Admitting: Internal Medicine

## 2017-06-13 MED ORDER — ATORVASTATIN CALCIUM 20 MG PO TABS: 20.0000 mg | ORAL_TABLET | Freq: Every day | ORAL | 0 refills | Status: DC

## 2017-06-13 NOTE — Telephone Encounter (Signed)
Notified pt can send 30 day to local pharmacy...Luke West

## 2017-06-13 NOTE — Telephone Encounter (Signed)
Per the patients chart patient is overdue for appointment  appt has been made for 07/11/2017 for his CPE Please advise on refill

## 2017-06-13 NOTE — Telephone Encounter (Signed)
Copied from [place] #[id number]. Topic: Quick Communication - Rx Refill/Question >> Jun 13, 2017  9:34 AM Chauncey Mann A wrote: Has the patient contacted their pharmacy? No.   (Agent: If no, request that the patient contact the pharmacy for the refill.)   Preferred Pharmacy (with phone number or street name): Pt. Calling to check on status of Refill of Atorvastation. Pt said refill was denied, yet pt. Does not know why. Please send RX to [facility]. Pt has three more days of medication left.    Agent: Please be advised that RX refills may take up to 3 business days. We ask that you follow-up with your pharmacy.

## 2017-07-09 NOTE — Patient Instructions (Addendum)
Test(s) ordered today. Your results will be released to MyChart (or called to you) after review, usually within 72hours after test completion. If any changes need to be made, you will be notified at that same time.  All other Health Maintenance issues reviewed.   All recommended immunizations and age-appropriate screenings are up-to-date or discussed.  No immunizations administered today.   Medications reviewed and updated.  No changes recommended at this time.  Your prescription(s) have been submitted to your pharmacy. Please take as directed and contact our office if you believe you are having problem(s) with the medication(s).  Please followup in one year    Health Maintenance, Male A healthy lifestyle and preventive care is important for your health and wellness. Ask your health care provider about what schedule of regular examinations is right for you. What should I know about weight and diet? Eat a Healthy Diet  Eat plenty of vegetables, fruits, whole grains, low-fat dairy products, and lean protein.  Do not eat a lot of foods high in solid fats, added sugars, or salt.  Maintain a Healthy Weight Regular exercise can help you achieve or maintain a healthy weight. You should:  Do at least 150 minutes of exercise each week. The exercise should increase your heart rate and make you sweat (moderate-intensity exercise).  Do strength-training exercises at least twice a week.  Watch Your Levels of Cholesterol and Blood Lipids  Have your blood tested for lipids and cholesterol every 5 years starting at 62 years of age. If you are at high risk for heart disease, you should start having your blood tested when you are 62 years old. You may need to have your cholesterol levels checked more often if: ? Your lipid or cholesterol levels are high. ? You are older than 62 years of age. ? You are at high risk for heart disease.  What should I know about cancer screening? Many types of  cancers can be detected early and may often be prevented. Lung Cancer  You should be screened every year for lung cancer if: ? You are a current smoker who has smoked for at least 30 years. ? You are a former smoker who has quit within the past 15 years.  Talk to your health care provider about your screening options, when you should start screening, and how often you should be screened.  Colorectal Cancer  Routine colorectal cancer screening usually begins at 62 years of age and should be repeated every 5-10 years until you are 62 years old. You may need to be screened more often if early forms of precancerous polyps or small growths are found. Your health care provider may recommend screening at an earlier age if you have risk factors for colon cancer.  Your health care provider may recommend using home test kits to check for hidden blood in the stool.  A small camera at the end of a tube can be used to examine your colon (sigmoidoscopy or colonoscopy). This checks for the earliest forms of colorectal cancer.  Prostate and Testicular Cancer  Depending on your age and overall health, your health care provider may do certain tests to screen for prostate and testicular cancer.  Talk to your health care provider about any symptoms or concerns you have about testicular or prostate cancer.  Skin Cancer  Check your skin from head to toe regularly.  Tell your health care provider about any new moles or changes in moles, especially if: ? There is a change   change in a mole's size, shape, or color. ? You have a mole that is larger than a pencil eraser.  Always use sunscreen. Apply sunscreen liberally and repeat throughout the day.  Protect yourself by wearing long sleeves, pants, a wide-brimmed hat, and sunglasses when outside.  What should I know about heart disease, diabetes, and high blood pressure?  If you are 18-39 years of age, have your blood pressure checked every 3-5 years. If you are  40 years of age or older, have your blood pressure checked every year. You should have your blood pressure measured twice-once when you are at a hospital or clinic, and once when you are not at a hospital or clinic. Record the average of the two measurements. To check your blood pressure when you are not at a hospital or clinic, you can use: ? An automated blood pressure machine at a pharmacy. ? A home blood pressure monitor.  Talk to your health care provider about your target blood pressure.  If you are between 45-79 years old, ask your health care provider if you should take aspirin to prevent heart disease.  Have regular diabetes screenings by checking your fasting blood sugar level. ? If you are at a normal weight and have a low risk for diabetes, have this test once every three years after the age of 45. ? If you are overweight and have a high risk for diabetes, consider being tested at a younger age or more often.  A one-time screening for abdominal aortic aneurysm (AAA) by ultrasound is recommended for men aged 65-75 years who are current or former smokers. What should I know about preventing infection? Hepatitis B If you have a higher risk for hepatitis B, you should be screened for this virus. Talk with your health care provider to find out if you are at risk for hepatitis B infection. Hepatitis C Blood testing is recommended for:  Everyone born from 1945 through 1965.  Anyone with known risk factors for hepatitis C.  Sexually Transmitted Diseases (STDs)  You should be screened each year for STDs including gonorrhea and chlamydia if: ? You are sexually active and are younger than 62 years of age. ? You are older than 62 years of age and your health care provider tells you that you are at risk for this type of infection. ? Your sexual activity has changed since you were last screened and you are at an increased risk for chlamydia or gonorrhea. Ask your health care provider if you  are at risk.  Talk with your health care provider about whether you are at high risk of being infected with HIV. Your health care provider may recommend a prescription medicine to help prevent HIV infection.  What else can I do?  Schedule regular health, dental, and eye exams.  Stay current with your vaccines (immunizations).  Do not use any tobacco products, such as cigarettes, chewing tobacco, and e-cigarettes. If you need help quitting, ask your health care provider.  Limit alcohol intake to no more than 2 drinks per day. One drink equals 12 ounces of beer, 5 ounces of wine, or 1 ounces of hard liquor.  Do not use street drugs.  Do not share needles.  Ask your health care provider for help if you need support or information about quitting drugs.  Tell your health care provider if you often feel depressed.  Tell your health care provider if you have ever been abused or do not feel safe at home.   This information is not intended to replace advice given to you by your health care provider. Make sure you discuss any questions you have with your health care provider. Document Released: 12/11/2007 Document Revised: 02/11/2016 Document Reviewed: 03/18/2015 Elsevier Interactive Patient Education  2018 [employer]  

## 2017-07-09 NOTE — Progress Notes (Signed)
Subjective:    Patient ID: Luke West, male    DOB: 1955/08/24, 62 y.o.   MRN: [id number]  HPI He is here for a physical exam.   He is exercising regularly.  He is exercising less over the holidays but typically goes 5-6 days a week.    He sees Dr Cruzita Lederer for his diabetes.    Recent vacation to [place]:  Last day of vacation, two days ago.  He developed a rash.  He can feel it - rough.  Not itchy.  No known bug bites. It started on the back of his neck and spread - now on back, chest and arms.  It has not gone below that.  He thinks it may be getting better.   Medications and allergies reviewed with patient and updated if appropriate.  Patient Active Problem List   Diagnosis Date Noted  . Psoriasis 03/15/2016  . Rosacea 03/15/2016  . Family history of premature CAD 06/12/2015  . Type 2 diabetes mellitus with diabetic nephropathy, without long-term current use of insulin ([place]) 04/15/2015  . Nocturnal leg cramps 12/11/2013  . BPH (benign prostatic hypertrophy) 03/05/2010  . Mixed hyperlipidemia 09/21/2007  . GOUT 05/11/2007  . RAYNAUD'S SYNDROME 05/11/2007    Current Outpatient Medications on File Prior to Visit  Medication Sig Dispense Refill  . aspirin 81 MG tablet Take 81 mg by mouth daily.     . canagliflozin (INVOKANA) 100 MG TABS tablet Take 1 tablet (100 mg total) by mouth daily. 14 tablet 0  . Continuous Blood Gluc Receiver (FREESTYLE LIBRE READER) DEVI 1 Device by Does not apply route 3 (three) times daily. 1 Device 1  . Continuous Blood Gluc Sensor (FREESTYLE LIBRE SENSOR SYSTEM) MISC 1 Device by Does not apply route every 30 (thirty) days. 3 each 11  . glipiZIDE (GLUCOTROL XL) 5 MG 24 hr tablet Take 5 mg by mouth daily with breakfast.    . glipiZIDE (GLUCOTROL) 5 MG tablet Take 1 tablet (5 mg total) by mouth daily before breakfast. 90 tablet 3  . INVOKANA 100 MG TABS tablet TAKE 1 TABLET BY MOUTH  DAILY 90 tablet 2  . metFORMIN (GLUCOPHAGE-XR) 500 MG 24 hr tablet  TAKE 2 TABLETS BY MOUTH 2x a day 360 tablet 3  . Multiple Vitamin (MULTIVITAMIN) tablet Take 1 tablet by mouth daily.    . ONGLYZA 5 MG TABS tablet TAKE 1 TABLET BY MOUTH  DAILY 90 tablet 2   No current facility-administered medications on file prior to visit.     Past Medical History:  Diagnosis Date  . DM (diabetes mellitus) ([place])   . [name] syndrome 2008   Total bilirubin 1.4  . Hyperlipemia   . Raynaud phenomenon     Past Surgical History:  Procedure Laterality Date  . COLONOSCOPY  2005 & 2010   negative X 2; Dr Watt Climes (due 2020)  . SHOULDER SURGERY     post dislocation sequellae  . TONSILLECTOMY AND ADENOIDECTOMY      Social History   Socioeconomic History  . Marital status: Married    Spouse name: None  . Number of children: None  . Years of education: None  . Highest education level: None  Social Needs  . Financial resource strain: None  . Food insecurity - worry: None  . Food insecurity - inability: None  . Transportation needs - medical: None  . Transportation needs - non-medical: None  Occupational History  . None  Tobacco Use  . Smoking  status: Former Smoker    Last attempt to quit: 06/28/1990    Years since quitting: 27.0  . Smokeless tobacco: Never Used  . Tobacco comment: GYJ:EHUDJ 3-4 year . Smoked [phone] , up to 1-2 ppd  Substance and Sexual Activity  . Alcohol use: Yes    Alcohol/week: 8.4 oz    Types: 14 Glasses of wine per week    Comment:  socially  . Drug use: No  . Sexual activity: None  Other Topics Concern  . None  Social History Narrative  . None    Family History  Problem Relation Age of Onset  . Aneurysm Mother        cns  . Hyperlipidemia Mother   . Lung cancer Father        smoker  . Aneurysm Father        AAA  . Heart attack Sister 35  . Diabetes Brother        type 1  . Lung cancer Paternal Uncle        smoker  . Aneurysm Maternal Grandmother        cns;PRE 17 (mid 55s)  . Diabetes Maternal Grandfather         type 1  . Lung cancer Paternal Grandfather        smoker  . Aneurysm Paternal Grandmother        AAA    Review of Systems  Constitutional: Negative for chills and fever.  Eyes: Negative for visual disturbance.  Respiratory: Negative for cough, shortness of breath and wheezing.   Cardiovascular: Negative for chest pain, palpitations and leg swelling.  Gastrointestinal: Positive for diarrhea (loose stool - diarrhea - ? related to metformin). Negative for abdominal pain, blood in stool, constipation and nausea.       Rare GERD  Genitourinary: Negative for dysuria and hematuria.  Musculoskeletal: Negative for arthralgias and back pain.  Skin: Positive for rash (fine rash x 2 days).  Neurological: Negative for light-headedness and headaches.  Psychiatric/Behavioral: Negative for dysphoric mood. The patient is not nervous/anxious.        Objective:   Vitals:   07/11/17 1420  BP: 140/90  Pulse: (!) 55  Resp: 16  Temp: 97.9 F (36.6 C)  SpO2: 98%   Filed Weights   07/11/17 1420  Weight: 197 lb (89.4 kg)   Body mass index is 28.27 kg/m.  Wt Readings from Last 3 Encounters:  07/11/17 197 lb (89.4 kg)  05/03/17 195 lb (88.5 kg)  11/24/16 194 lb (88 kg)     Physical Exam Constitutional: He appears well-developed and well-nourished. No distress.  HENT:  Head: Normocephalic and atraumatic.  Right Ear: External ear normal.  Left Ear: External ear normal.  Mouth/Throat: Oropharynx is clear and moist.  Normal ear canals and TM b/l  Eyes: Conjunctivae and EOM are normal.  Neck: Neck supple. No tracheal deviation present. No thyromegaly present.  No carotid bruit  Cardiovascular: Normal rate, regular rhythm, normal heart sounds and intact distal pulses.   No murmur heard. Pulmonary/Chest: Effort normal and breath sounds normal. No respiratory distress. He has no wheezes. He has no rales.  Abdominal: Soft. He exhibits no distension. There is no tenderness.  Genitourinary:  deferred to urology Musculoskeletal: He exhibits no edema.  Lymphadenopathy:   He has no cervical adenopathy.  Skin: Skin is warm and dry. He is not diaphoretic.  Psychiatric: He has a normal mood and affect. His behavior is normal.  Assessment & Plan:   Physical exam: Screening blood work  ordered Immunizations  Had the new shingrix, others up to date Colonoscopy  Up to date  Eye exams   Up to date  Exercise  regular Weight overweight - will work on weight loss Skin  Sees derm 1year Substance abuse  none  [place] Problem List for Assessment and Plan of chronic medical problems.   FU in one year

## 2017-07-11 ENCOUNTER — Ambulatory Visit (INDEPENDENT_AMBULATORY_CARE_PROVIDER_SITE_OTHER): Payer: 59 | Admitting: Internal Medicine

## 2017-07-11 ENCOUNTER — Encounter: Payer: Self-pay | Admitting: Internal Medicine

## 2017-07-11 VITALS — BP 140/90 | HR 55 | Temp 97.9°F | Resp 16 | Ht 70.0 in | Wt 197.0 lb

## 2017-07-11 DIAGNOSIS — N400 Enlarged prostate without lower urinary tract symptoms: Secondary | ICD-10-CM | POA: Diagnosis not present

## 2017-07-11 DIAGNOSIS — M109 Gout, unspecified: Secondary | ICD-10-CM | POA: Diagnosis not present

## 2017-07-11 DIAGNOSIS — E782 Mixed hyperlipidemia: Secondary | ICD-10-CM | POA: Diagnosis not present

## 2017-07-11 DIAGNOSIS — Z0000 Encounter for general adult medical examination without abnormal findings: Secondary | ICD-10-CM | POA: Diagnosis not present

## 2017-07-11 DIAGNOSIS — Z8249 Family history of ischemic heart disease and other diseases of the circulatory system: Secondary | ICD-10-CM | POA: Insufficient documentation

## 2017-07-11 DIAGNOSIS — E1121 Type 2 diabetes mellitus with diabetic nephropathy: Secondary | ICD-10-CM

## 2017-07-11 MED ORDER — ATORVASTATIN CALCIUM 20 MG PO TABS
20.0000 mg | ORAL_TABLET | Freq: Every day | ORAL | 3 refills | Status: DC
Start: 2017-07-11 — End: 2018-05-17

## 2017-07-11 NOTE — Assessment & Plan Note (Signed)
a1c 6.5% two months ago Sugars on average 140-150's Exercising regularly Sees Dr Cruzita Lederer Q 4 months

## 2017-07-11 NOTE — Assessment & Plan Note (Signed)
Check lipid panel  Continue daily statin Regular exercise and healthy diet encouraged  

## 2017-07-11 NOTE — Assessment & Plan Note (Addendum)
Check Uric acid No recent symptoms

## 2017-07-11 NOTE — Assessment & Plan Note (Signed)
Follows with urology - Dr Junious Silk

## 2017-07-13 ENCOUNTER — Other Ambulatory Visit (INDEPENDENT_AMBULATORY_CARE_PROVIDER_SITE_OTHER): Payer: 59

## 2017-07-13 DIAGNOSIS — M109 Gout, unspecified: Secondary | ICD-10-CM

## 2017-07-13 DIAGNOSIS — E1121 Type 2 diabetes mellitus with diabetic nephropathy: Secondary | ICD-10-CM | POA: Diagnosis not present

## 2017-07-13 DIAGNOSIS — E782 Mixed hyperlipidemia: Secondary | ICD-10-CM | POA: Diagnosis not present

## 2017-07-13 LAB — COMPREHENSIVE METABOLIC PANEL
ALBUMIN: 4.4 g/dL (ref 3.5–5.2)
ALK PHOS: 72 U/L (ref 39–117)
ALT: 15 U/L (ref 0–53)
AST: 22 U/L (ref 0–37)
BILIRUBIN TOTAL: 0.4 mg/dL (ref 0.2–1.2)
BUN: 25 mg/dL — ABNORMAL HIGH (ref 6–23)
CALCIUM: 9.6 mg/dL (ref 8.4–10.5)
CHLORIDE: 100 meq/L (ref 96–112)
CO2: 26 mEq/L (ref 19–32)
CREATININE: 1.22 mg/dL (ref 0.40–1.50)
GFR: 64.13 mL/min (ref >60.00)
Glucose, Bld: 213 mg/dL — ABNORMAL HIGH (ref 70–99)
Potassium: 5.2 mEq/L — ABNORMAL HIGH (ref 3.5–5.1)
Sodium: 135 mEq/L (ref 135–145)
TOTAL PROTEIN: 7 g/dL (ref 6.0–8.3)

## 2017-07-13 LAB — TSH: TSH: 4.33 u[IU]/mL (ref 0.35–4.50)

## 2017-07-13 LAB — CBC WITH DIFFERENTIAL/PLATELET
BASOS ABS: 0.1 10*3/uL (ref 0.0–0.1)
BASOS PCT: 1 % (ref 0.0–3.0)
EOS ABS: 0.2 10*3/uL (ref 0.0–0.7)
Eosinophils Relative: 3.6 % (ref 0.0–5.0)
HEMATOCRIT: 46.2 % (ref 39.0–52.0)
HEMOGLOBIN: 15.4 g/dL (ref 13.0–17.0)
LYMPHS PCT: 28.9 % (ref 12.0–46.0)
Lymphs Abs: 1.8 10*3/uL (ref 0.7–4.0)
MCHC: 33.4 g/dL (ref 30.0–36.0)
MCV: 96.5 fl (ref 78.0–100.0)
Monocytes Absolute: 0.5 10*3/uL (ref 0.1–1.0)
Monocytes Relative: 8.7 % (ref 3.0–12.0)
Neutro Abs: 3.5 10*3/uL (ref 1.4–7.7)
Neutrophils Relative %: 57.8 % (ref 43.0–77.0)
Platelets: 160 10*3/uL (ref 150.0–400.0)
RBC: 4.78 Mil/uL (ref 4.22–5.81)
RDW: 12.6 % (ref 11.5–15.5)
WBC: 6.1 10*3/uL (ref 4.0–10.5)

## 2017-07-13 LAB — LIPID PANEL
CHOLESTEROL: 215 mg/dL — AB (ref 0–200)
HDL: 43.4 mg/dL (ref >39.00)
NonHDL: 171.93
Total CHOL/HDL Ratio: 5
Triglycerides: 387 mg/dL — ABNORMAL HIGH (ref 0.0–149.0)
VLDL: 77.4 mg/dL — AB (ref 0.0–40.0)

## 2017-07-13 LAB — LDL CHOLESTEROL, DIRECT: Direct LDL: 118 mg/dL

## 2017-07-13 LAB — URIC ACID: URIC ACID, SERUM: 5.4 mg/dL (ref 4.0–7.8)

## 2017-07-14 ENCOUNTER — Encounter: Payer: Self-pay | Admitting: Internal Medicine

## 2017-07-14 DIAGNOSIS — E7849 Other hyperlipidemia: Secondary | ICD-10-CM

## 2017-07-27 ENCOUNTER — Other Ambulatory Visit: Payer: Self-pay | Admitting: Internal Medicine

## 2017-08-29 ENCOUNTER — Telehealth: Payer: Self-pay | Admitting: Internal Medicine

## 2017-08-29 MED ORDER — FREESTYLE LIBRE 14 DAY READER DEVI: 1.0000 | Freq: Once | 0 refills | Status: AC

## 2017-08-29 MED ORDER — FREESTYLE LIBRE 14 DAY SENSOR MISC: 1.0000 | 11 refills | Status: DC

## 2017-08-29 NOTE — Telephone Encounter (Signed)
Spoke with pt, pt states that it is causing a little discomfort and does have pain when something hits it the wrong way. Does not know how long it has been there, but does say that it is maybe the size of the tip of a pen. He states he has had many skin removals and does fill this is one that does not need stitches after removal. Please advise if you are okay with him coming to see you, has Derm appt for 3/21

## 2017-08-29 NOTE — Telephone Encounter (Signed)
Noted  

## 2017-08-29 NOTE — Telephone Encounter (Signed)
Sent 14 day reader and 14 day sensors, 10 day is being discontinued. Pt is aware

## 2017-08-29 NOTE — Telephone Encounter (Signed)
Yes, I can remove it

## 2017-08-29 NOTE — Telephone Encounter (Signed)
Pt wants Luke West to know that his dermatologist has worked him in for Wednesday. He wanted to thank you taylor for trying to get him worked in.

## 2017-08-29 NOTE — Telephone Encounter (Signed)
Patient called to Luke West stating that he has a skin tag under his arm pit that has turned black.  States his dermatologist can not get him in until mid march and wanted to know if Qu[facility] Burow could remove.  I informed agent that I did not think [facility] would be able to do this but would send a message back to follow up in regard.  I also informed agent that patient did not need a referral for a dermatologist and could check with other dermatologist offices as well.

## 2017-08-29 NOTE — Telephone Encounter (Signed)
Patient need a new prescription for the Continuous Blood Gluc Receiver ([place]) [place] [[id number]]    Pharmacy:  [id number], [place] DEA #:  [id number]   [phone]

## 2017-08-31 ENCOUNTER — Other Ambulatory Visit: Payer: Self-pay | Admitting: Internal Medicine

## 2017-08-31 ENCOUNTER — Ambulatory Visit: Payer: 59 | Admitting: Internal Medicine

## 2017-08-31 ENCOUNTER — Encounter: Payer: Self-pay | Admitting: Internal Medicine

## 2017-08-31 VITALS — BP 114/72 | HR 62 | Ht 70.0 in | Wt 192.8 lb

## 2017-08-31 DIAGNOSIS — E1121 Type 2 diabetes mellitus with diabetic nephropathy: Secondary | ICD-10-CM | POA: Diagnosis not present

## 2017-08-31 LAB — POCT GLYCOSYLATED HEMOGLOBIN (HGB A1C): Hemoglobin A1C: 8.1

## 2017-08-31 MED ORDER — INSULIN DEGLUDEC 100 UNIT/ML ~~LOC~~ SOPN: 12.0000 [IU] | PEN_INJECTOR | Freq: Every day | SUBCUTANEOUS | 5 refills | Status: DC

## 2017-08-31 MED ORDER — INSULIN PEN NEEDLE 32G X 4 MM MISC: 3 refills | Status: DC

## 2017-08-31 NOTE — Addendum Note (Signed)
Addended by: Leia Alf on: 08/31/2017 09:58 AM   Modules accepted: Orders

## 2017-08-31 NOTE — Progress Notes (Addendum)
Patient ID: Luke West, male   DOB: Jun 20, 1956, 62 y.o.   MRN: [id number]  HPI: Luke West is a 62 y.o.-year-old male, returning for follow-up for DM2, dx in 2003, non-insulin-dependent, uncontrolled, with complications (CKD). Last visit 4 months ago.  Over the winter >> he has been in [place] and also had a URI >> sugars in upper 100s-300s.  Reviewed latest HbA1c levels: Lab Results  Component Value Date   HGBA1C 6.5 05/03/2017   HGBA1C 7.2 11/24/2016   HGBA1C 6.5 08/06/2016  04/06/2016: HbA1c calculated from fructosamine: 6.08%  Pt is on a regimen of: - Metformin XR 1000 mg 2x a day with meals - Invokana 100 mg in am. - Onglyza 5 mg in am - Glipizide XL 5 mg in am, before b'fast. - Glipizide 5 mg in am, before b'fast Previously on JanuMet and Kombiglyze.  Pt checks his sugars with the Libre CGM >> ave 146 >> 158 +/- 47.4 (coeff of var. 30%). Above target: 50% >> 27% In target: 48% >> 72% Below target: 2% >> 1%  Sugars per CGM download (we will scan) - 25%-75%: Very high: - am:  145-205, 231 >> 100-160 >> 100-150 >> 180-210 - post exercise: 125-250, 300s >> 150-220 (after exercise) >> 240->350 - before lunch: 188-228 >> 120 - 180 >> 150-200 >> 210-280 - 2h after lunch: 162-222, 301 >> 110-210 >> 140-160 >> 180-260 - before dinner: 102-156 >> 100-140 >> 120-150 >> 180-220 - 2h after dinner: 106-204 >> 130-250 >> not enough data >> 210-280 - bedtime:89-207, 248 >> 80-160 >> 100-150 >> 170-270 - nighttime:n/c Lowest sugar was 41 before dinner >> 140; he has hypoglycemia awareness in the 70s. Highest sugar was 261 >> >350.  Glucometer: One Touch Ultra Mini  Pt's meals are: - Breakfast: 3 days a week, yoghurt  - Lunch: wide variety - Dinner: meat, veggies, strach - Snacks: 1+ - nuts; crackers + PB, chips and salsa  He exercises 5 times a week in a.m.  He alternates between cardio and strength training.  - + + Mild CKD, last BUN/creatinine:  Lab Results  Component  Value Date   BUN 25 (H) 07/13/2017   CREATININE 1.22 07/13/2017   Lab Results  Component Value Date   GFRNONAA 72.64 03/05/2010   GFRNONAA 67.46 09/11/2008   GFRNONAA 52 08/21/2007   GFRNONAA 75 08/22/2006   No MAU: Lab Results  Component Value Date   MICRALBCREAT 0.9 08/13/2016   MICRALBCREAT 0.8 07/17/2014   MICRALBCREAT 0.7 08/10/2013   MICRALBCREAT 0.8 03/02/2012   MICRALBCREAT 1.7 10/27/2011   MICRALBCREAT 1.1 07/26/2011   MICRALBCREAT 0.9 03/05/2010   MICRALBCREAT 2.8 10/10/2008   MICRALBCREAT 29.8 03/21/2008   MICRALBCREAT 3.0 08/22/2006   -+ HL; last set of lipids: Lab Results  Component Value Date   CHOL 215 (H) 07/13/2017   HDL 43.40 07/13/2017   LDLCALC 98 08/13/2016   LDLDIRECT 118.0 07/13/2017   TRIG 387.0 (H) 07/13/2017   CHOLHDL 5 07/13/2017  On Lipitor. - last eye exam was by Dr. Kathrin Penner -03/2017: No DR -Denies numbness and tingling in his feet.  He has a history of Raynaud's phenomenon.  ROS: Constitutional: no weight gain/no weight loss, no fatigue, no subjective hyperthermia, no subjective hypothermia Eyes: no blurry vision, no xerophthalmia ENT: no sore throat, no nodules palpated in throat, no dysphagia, no odynophagia, no hoarseness Cardiovascular: no CP/no SOB/no palpitations/no leg swelling Respiratory: no cough/no SOB/no wheezing Gastrointestinal: no N/no V/no D/no C/no acid reflux Musculoskeletal: no  muscle aches/no joint aches Skin: no rashes, no hair loss Neurological: no tremors/no numbness/no tingling/no dizziness  I reviewed pt's medications, allergies, PMH, social hx, family hx, and changes were documented in the history of present illness. Otherwise, unchanged from my initial visit note.  Past Medical History:  Diagnosis Date  . DM (diabetes mellitus) ([place])   . [name] syndrome 2008   Total bilirubin 1.4  . Hyperlipemia   . Raynaud phenomenon    Past Surgical History:  Procedure Laterality Date  . COLONOSCOPY  2005 &  2010   negative X 2; Dr Watt Climes (due 2020)  . SHOULDER SURGERY     post dislocation sequellae  . TONSILLECTOMY AND ADENOIDECTOMY     History   Social History  . Marital Status: Married    Spouse Name: N/A  . Number of Children: 3   Occupational History  . Real estate development/construction   Social History Main Topics  . Smoking status: Former Smoker    Quit date: 06/28/1990  . Smokeless tobacco: Not on file     Comment: DPO:EUMPN 3-4 year . Smoked [phone] , up to 1-2 ppd  . Alcohol Use: 8.4 oz/week    14 Glasses of wine per week     Comment:  socially  . Drug Use: No   Current Outpatient Medications on File Prior to Visit  Medication Sig Dispense Refill  . aspirin 81 MG tablet Take 81 mg by mouth daily.     Marland Kitchen atorvastatin (LIPITOR) 20 MG tablet Take 1 tablet (20 mg total) by mouth daily. 90 tablet 3  . canagliflozin (INVOKANA) 100 MG TABS tablet Take 1 tablet (100 mg total) by mouth daily. 14 tablet 0  . Continuous Blood Gluc Sensor (FREESTYLE LIBRE 14 DAY SENSOR) MISC 1 each by Does not apply route every 14 (fourteen) days. 2 each 11  . glipiZIDE (GLUCOTROL XL) 5 MG 24 hr tablet Take 5 mg by mouth daily with breakfast.    . glipiZIDE (GLUCOTROL) 5 MG tablet Take 1 tablet (5 mg total) by mouth daily before breakfast. 90 tablet 3  . INVOKANA 100 MG TABS tablet TAKE 1 TABLET BY MOUTH  DAILY 90 tablet 2  . metFORMIN (GLUCOPHAGE-XR) 500 MG 24 hr tablet TAKE 4 TABLETS BY MOUTH  DAILY WITH SUPPER 360 tablet 0  . Multiple Vitamin (MULTIVITAMIN) tablet Take 1 tablet by mouth daily.    . ONGLYZA 5 MG TABS tablet TAKE 1 TABLET BY MOUTH  DAILY 90 tablet 2   No current facility-administered medications on file prior to visit.    No Known Allergies Family History  Problem Relation Age of Onset  . Aneurysm Mother        cns  . Hyperlipidemia Mother   . Lung cancer Father        smoker  . Aneurysm Father        AAA  . Heart attack Sister 16  . Diabetes Brother        type 1   . Lung cancer Paternal Uncle        smoker  . Aneurysm Maternal Grandmother        cns;PRE 69 (mid 52s)  . Diabetes Maternal Grandfather        type 1  . Lung cancer Paternal Grandfather        smoker  . Aneurysm Paternal Grandmother        AAA   PE: BP 114/72   Pulse 62   Ht 5\' 10"  (1.778  m)   Wt 192 lb 12.8 oz (87.5 kg)   SpO2 94%   BMI 27.66 kg/m  Body mass index is 27.66 kg/m. Wt Readings from Last 3 Encounters:  08/31/17 192 lb 12.8 oz (87.5 kg)  07/11/17 197 lb (89.4 kg)  05/03/17 195 lb (88.5 kg)   Constitutional: Slightly overweight, in NAD Eyes: PERRLA, EOMI, no exophthalmos ENT: moist mucous membranes, no thyromegaly, no cervical lymphadenopathy Cardiovascular: RRR, No MRG Respiratory: CTA B Gastrointestinal: abdomen soft, NT, ND, BS+ Musculoskeletal: no deformities, strength intact in all 4 Skin: moist, warm, no rashes Neurological: no tremor with outstretched hands, DTR normal in all 4  ASSESSMENT: 1. DM2, non-insulin-dependent, uncontrolled, with complications - CKD  PLAN:  1. Patient with long-standing, uncontrolled, diabetes, on oral antidiabetic regimen, with previously fairly well-controlled blood sugars except for very high blood sugars during his last vacation in [place].  At that time, we discussed about the possibility of adding long-acting insulin, but he did not want to pursue this, as sugars improved slightly afterwards.  He usually also has higher sugars ge hyperglycemic peak) after exercise, regardless whether he is doing cardio or strength training, which she alternates.  He saw this pattern throughout his entire life.  We discussed about adding a regular glipizide 5 mg before exercise or eating a 15 g of carb snack before exercise to avoid gluconeogenesis and subsequent hypoglycemia. - At this visit, his sugars are much higher than before, and he tells me that they never improved significantly after his [place] trip and his most recent URIs.   Therefore, at this visit, we discussed about the need to test him for type 1 diabetes (I ordered the labs, but we cannot check done today since his sugars were in the 260s before he left home this morning).  I advised him to come have these labs drawn when his sugars are better, ideally lower than 180 - in the meantime, as his sugars are very high, we need to add insulin.  I suggested long-acting insulin and we discussed about correct injection techniques and pen use. I advised him to: When injecting insulin:  Inject in the abdomen  Rotate the injection sites around the belly button  Change needle for each injection  Keep needle in for 6 sec after last unit of insulin in  Keep the insulin in use out of the fridge - We will start on the low-dose of Tresiba and I advised him how to adjust the dose up - I suggested to:  Patient Instructions  Please continue:  - Metformin XR 500 mg 3x a day with meals - Invokana 100 mg in am. - Onglyza 5 mg in am - Glipizide XL 5 mg in am, before b'fast. - Glipizide 5 mg in am, before b'fast  Please start Tresiba 12 units daily. You can increase the dose by 4 units every 4 days if sugars stay high.  Please come back for labwork when sugars <180.  Please come back for a follow-up appointment in 1.5 months.  - today, HbA1c is 8.1% (higher) - continue checking sugars at different times of the day - check 1-2x a day, rotating checks - advised for yearly eye exams >> he is UTD - Return to clinic in 4 mo with sugar log   - time spent with the patient: 25 min, of which >50% was spent in reviewing his CGM downloads, discussing his hyper-glycemic episodes, reviewing  previous labs and insulin doses and developing a plan to avoid hypo-  and hyper-glycemia.  We also discussed about how to inject insulin, how to use the insulin pen (please see discuss topics above).  Component     Latest Ref Rng & Units 09/02/2017  ZNT8 Antibodies     U/mL <15  Glutamic Acid  Decarb Ab     <5 IU/mL <5  C-Peptide     0.80 - 3.85 ng/mL 0.51 (L)  Glucose, Plasma     65 - 99 mg/dL 141 (H)  Islet Cell Ab     Neg:<1:1 Negative   Low insulin production but not antipancreatic antibodies.  Philemon Kingdom, MD PhD [facility] Endocrinology

## 2017-08-31 NOTE — Patient Instructions (Addendum)
Please continue:  - Metformin XR 500 mg 3x a day with meals - Invokana 100 mg in am. - Onglyza 5 mg in am - Glipizide XL 5 mg in am, before b'fast. - Glipizide 5 mg in am, before b'fast  Please start Tresiba 12 units daily. You can increase the dose by 4 units every 4 days if sugars stay high.  Please come back for labwork when sugars <180.  Please come back for a follow-up appointment in 1.5 months.

## 2017-09-02 ENCOUNTER — Other Ambulatory Visit: Payer: 59

## 2017-09-02 DIAGNOSIS — E1121 Type 2 diabetes mellitus with diabetic nephropathy: Secondary | ICD-10-CM

## 2017-09-05 LAB — C-PEPTIDE: C-Peptide: 0.51 ng/mL — ABNORMAL LOW (ref 0.80–3.85)

## 2017-09-05 LAB — GLUTAMIC ACID DECARBOXYLASE AUTO ABS

## 2017-09-05 LAB — GLUCOSE, FASTING: GLUCOSE, PLASMA: 141 mg/dL — AB (ref 65–99)

## 2017-09-06 LAB — ANTI-ISLET CELL ANTIBODY: ISLET CELL AB: NEGATIVE

## 2017-09-07 ENCOUNTER — Encounter: Payer: Self-pay | Admitting: Internal Medicine

## 2017-09-10 LAB — ZNT8 ANTIBODIES: ZNT8 Antibodies: <15 U/mL

## 2017-10-20 ENCOUNTER — Ambulatory Visit: Payer: 59 | Admitting: Internal Medicine

## 2017-10-20 ENCOUNTER — Encounter: Payer: Self-pay | Admitting: Internal Medicine

## 2017-10-20 VITALS — BP 120/80 | HR 69 | Resp 16 | Wt 201.2 lb

## 2017-10-20 DIAGNOSIS — E1021 Type 1 diabetes mellitus with diabetic nephropathy: Secondary | ICD-10-CM

## 2017-10-20 DIAGNOSIS — E782 Mixed hyperlipidemia: Secondary | ICD-10-CM | POA: Diagnosis not present

## 2017-10-20 MED ORDER — INSULIN DEGLUDEC 100 UNIT/ML ~~LOC~~ SOPN: 14.0000 [IU] | PEN_INJECTOR | Freq: Every day | SUBCUTANEOUS | 5 refills | Status: DC

## 2017-10-20 NOTE — Patient Instructions (Addendum)
Please continue: - Metformin ER 1000 mg 2x a day with meals - Invokana 100 mg in am. - Onglyza 5 mg in am - Glipizide 5 mg in am, before b'fast - Tresiba 14 units but move it to am  Please stop: - Glipizide ER  Please come back for a follow-up appointment in 3 months.

## 2017-10-20 NOTE — Progress Notes (Signed)
Patient ID: Luke West, male   DOB: [birth year], 62 y.o.   MRN: [id number]  HPI: Luke West is a 62 y.o.-year-old male, returning for follow-up for DM2, dx in 2003, but diagnosed as DM1 in 08/2017, insulin-dependent since 08/2017, uncontrolled, with complications (CKD). Last visit 1.5 months ago.  At last visit, we diagnosed insulin deficiency, consistent with a diagnosis of type 1 diabetes.  No detectable autoimmunity. Component     Latest Ref Rng & Units 09/02/2017  ZNT8 Antibodies     U/mL <15  Glutamic Acid Decarb Ab     <5 IU/mL <5  C-Peptide     0.80 - 3.85 ng/mL 0.51 (L)  Glucose, Plasma     65 - 99 mg/dL 141 (H)  Islet Cell Ab     Neg:<1:1 Negative   Reviewed latest HbA1c levels: Lab Results  Component Value Date   HGBA1C 8.1 08/31/2017   HGBA1C 6.5 05/03/2017   HGBA1C 7.2 11/24/2016  04/06/2016: HbA1c calculated from fructosamine: 6.08%  Pt is on a regimen of: - Metformin ER 1000 mg 2x a day with meals: L and D - Invokana 100 mg in am. - Onglyza 5 mg in am - Glipizide XL 5 mg in am, before b'fast. - Glipizide 5 mg in am, before b'fast - Tresiba 12 >> 16 >> 14 units daily >> started 08/2017 Previously on [place] and [place].  Pt checks his sugars with the Libre CGM >> ave 146 >> 158 +/- 47.4 >>  (coeff of var. 30%). Freestyle Libre CGM parameters: - average: 138 - time in range:  - low (<70): 2% - 70-99: 16% - normal (100-150): 50% - 151-240: 28% - high (>240): 4%  Sugars per CGM (25-75%) -per review of his phone CGM app records:  - am:  145-205, 231 >> 100-160 >> 100-150 >> 180-210 >> 90-115 - post exercise: 150-220 (after exercise) >> 240->350 >> 130-180 - before lunch:  120 - 180 >> 150-200 >> 210-280 >> 130-160 - 2h after lunch:  110-210 >> 140-160 >> 180-260 >> 130-170 - before dinner: 100-140 >> 120-150 >> 180-220 >> 110-160 - 2h after dinner: 106-204 >> 130-250 >> 210-280 >> 110-160 - bedtime: 80-160 >> 100-150 >> 170-270 >> 100-140 -  nighttime:n/c >> 75-140 Lowest sugar was 41 before dinner >> 140 >> 50s; he has hypoglycemia awareness in the 70s. Highest sugar was 261 >> >350 >> 200s.  Glucometer: One Touch Ultra Mini  Pt's meals are: - Breakfast: 3 days a week, yoghurt  - Lunch: wide variety - Dinner: meat, veggies, strach - Snacks: 1+ - nuts; crackers + PB, chips and salsa  He exercises 5 times a week in the morning, cardio and strength.  -+ Mild CKD, last BUN/creatinine:  Lab Results  Component Value Date   BUN 25 (H) 07/13/2017   CREATININE 1.22 07/13/2017   Lab Results  Component Value Date   GFRNONAA 72.64 03/05/2010   GFRNONAA 67.46 09/11/2008   GFRNONAA 52 08/21/2007   GFRNONAA 75 08/22/2006   No microalbuminuria: Lab Results  Component Value Date   MICRALBCREAT 0.9 08/13/2016   MICRALBCREAT 0.8 07/17/2014   MICRALBCREAT 0.7 08/10/2013   MICRALBCREAT 0.8 03/02/2012   MICRALBCREAT 1.7 10/27/2011   MICRALBCREAT 1.1 07/26/2011   MICRALBCREAT 0.9 03/05/2010   MICRALBCREAT 2.8 10/10/2008   MICRALBCREAT 29.8 03/21/2008   MICRALBCREAT 3.0 08/22/2006   -+ HL; last set of lipids: Lab Results  Component Value Date   CHOL 215 (H) 07/13/2017   HDL 43.40  07/13/2017   LDLCALC 98 08/13/2016   LDLDIRECT 118.0 07/13/2017   TRIG 387.0 (H) 07/13/2017   CHOLHDL 5 07/13/2017  On Lipitor. - last eye exam was by Dr. Kathrin Penner -03/2017: No DR - no numbness and tingling in his feet.  He has a history of Raynaud's phenomenon.  ROS: Constitutional: + weight gain/no weight loss, no fatigue, no subjective hyperthermia, no subjective hypothermia Eyes: no blurry vision, no xerophthalmia ENT: no sore throat, no nodules palpated in throat, no dysphagia, no odynophagia, no hoarseness Cardiovascular: no CP/no SOB/no palpitations/no leg swelling Respiratory: no cough/no SOB/no wheezing Gastrointestinal: no N/no V/no D/no C/no acid reflux Musculoskeletal: no muscle aches/no joint aches Skin: no rashes, no  hair loss Neurological: no tremors/no numbness/no tingling/no dizziness  I reviewed pt's medications, allergies, PMH, social hx, family hx, and changes were documented in the history of present illness. Otherwise, unchanged from my initial visit note.  Past Medical History:  Diagnosis Date  . DM (diabetes mellitus) ([place])   . [name] syndrome 2008   Total bilirubin 1.4  . Hyperlipemia   . Raynaud phenomenon    Past Surgical History:  Procedure Laterality Date  . COLONOSCOPY  2005 & 2010   negative X 2; Dr Watt Climes (due 2020)  . SHOULDER SURGERY     post dislocation sequellae  . TONSILLECTOMY AND ADENOIDECTOMY     History   Social History  . Marital Status: Married    Spouse Name: N/A  . Number of Children: 3   Occupational History  . Real estate development/construction   Social History Main Topics  . Smoking status: Former Smoker    Quit date: 06/28/1990  . Smokeless tobacco: Not on file     Comment: KCM:[id number] 3-4 year . Smoked [phone] , up to 1-2 ppd  . Alcohol Use: 8.4 oz/week    14 Glasses of wine per week     Comment:  socially  . Drug Use: No   Current Outpatient Medications on File Prior to Visit  Medication Sig Dispense Refill  . aspirin 81 MG tablet Take 81 mg by mouth daily.     Marland Kitchen atorvastatin (LIPITOR) 20 MG tablet Take 1 tablet (20 mg total) by mouth daily. 90 tablet 3  . Continuous Blood Gluc Sensor (FREESTYLE LIBRE 14 DAY SENSOR) MISC 1 each by Does not apply route every 14 (fourteen) days. 2 each 11  . glipiZIDE (GLUCOTROL XL) 5 MG 24 hr tablet Take 5 mg by mouth daily with breakfast.    . insulin degludec (TRESIBA FLEXTOUCH) 100 UNIT/ML SOPN FlexTouch Pen Inject 0.12 mLs (12 Units total) into the skin daily at 10 pm. 5 pen 5  . Insulin Pen Needle (CAREFINE PEN NEEDLES) 32G X 4 MM MISC Use 1x a day 100 each 3  . INVOKANA 100 MG TABS tablet TAKE 1 TABLET BY MOUTH  DAILY 90 tablet 1  . metFORMIN (GLUCOPHAGE-XR) 500 MG 24 hr tablet TAKE 4 TABLETS BY MOUTH   DAILY WITH SUPPER 360 tablet 0  . Multiple Vitamin (MULTIVITAMIN) tablet Take 1 tablet by mouth daily.    . ONGLYZA 5 MG TABS tablet TAKE 1 TABLET BY MOUTH  DAILY 90 tablet 2   No current facility-administered medications on file prior to visit.    No Known Allergies Family History  Problem Relation Age of Onset  . Aneurysm Mother        cns  . Hyperlipidemia Mother   . Lung cancer Father        smoker  .  Aneurysm Father        AAA  . Heart attack Sister 59  . Diabetes Brother        type 1  . Lung cancer Paternal Uncle        smoker  . Aneurysm Maternal Grandmother        cns;PRE 23 (mid 58s)  . Diabetes Maternal Grandfather        type 1  . Lung cancer Paternal Grandfather        smoker  . Aneurysm Paternal Grandmother        AAA   PE: BP 120/80   Pulse 69   Resp 16   Wt 201 lb 3.2 oz (91.3 kg)   SpO2 (!) 69%   BMI 28.87 kg/m  Body mass index is 28.87 kg/m. Wt Readings from Last 3 Encounters:  10/20/17 201 lb 3.2 oz (91.3 kg)  08/31/17 192 lb 12.8 oz (87.5 kg)  07/11/17 197 lb (89.4 kg)   Constitutional: Slightly overweight, in NAD Eyes: PERRLA, EOMI, no exophthalmos ENT: moist mucous membranes, no thyromegaly, no cervical lymphadenopathy Cardiovascular: RRR, No MRG Respiratory: CTA B Gastrointestinal: abdomen soft, NT, ND, BS+ Musculoskeletal: no deformities, strength intact in all 4 Skin: moist, warm, no rashes Neurological: no tremor with outstretched hands, DTR normal in all 4  ASSESSMENT: 1. DM1, uncontrolled, with complications - CKD  2. HL  PLAN:  1. Patient with long-standing, uncontrolled, insulin deficient diabetes, initially on oral antidiabetic regimen and now also on basal insulin added at last visi as his sugars were much higher.  HbA1c was 8.1%, higher.  At that time, labs confirmed insulin deficiency without autoimmunity.  We discussed that this visits that this is indicative of type 1 diabetes. He would be a good candidate for an insulin  pump   In the future, especially if we need to add mealtime insulin.   - He did gain weight from insulin and I explained that this is expected.  Otherwise, no problems with insulin injections or other side effects. - At this visit, sugars are impressively improved after the addition of a low-dose of long-acting insulin.  In fact, he saw increased low blood sugars after he went up on the dose of Tresiba from 12 to 16 units.  He then backed off to 14 units.  We will continue this, however, I advised him to move this in the morning since he is dropping his sugars in the evening and at night.  To avoid further hypoglycemia, I also advised him to stop his glipizide XL, and the next visit, I plan to stop the regular glipizide in a.m., also - He continues to see a hyperglycemic peak after exercise,  regardless of what type of exercise he is doing, cardio or strength.  He saw this pattern throughout his entire life.  The peak is now smaller than before, but still above normal range.  We discussed at last visit about eating a 15 g of carb snack before exercise to avoid gluconeogenesis and subsequent hyperglycemia.  - I advised him to: Patient Instructions  Please continue: - Metformin ER 1000 mg 2x a day with meals - Invokana 100 mg in am. - Onglyza 5 mg in am - Glipizide 5 mg in am, before b'fast - Tresiba 14 units but move it to am  Please stop: - Glipizide ER  Please come back for a follow-up appointment in 3 months.  - continue checking sugars at different times of the day - check at  least 4 times a day with CGM - advised for yearly eye exams >> he is UTD - Return to clinic in 3 mo with sugar log.  We will check his HbA1c at that time.   2. HL - Reviewed latest lipid panel from 06/2017: LDL improved, but not quite at goal, triglycerides high - Continues the statin without side effects. - Improving his diabetes control will also help    Philemon Kingdom, MD PhD [facility]

## 2018-02-22 ENCOUNTER — Telehealth: Payer: Self-pay

## 2018-02-22 MED ORDER — BASAGLAR KWIKPEN 100 UNIT/ML ~~LOC~~ SOPN: 14.0000 [IU] | PEN_INJECTOR | Freq: Every day | SUBCUTANEOUS | 1 refills | Status: DC

## 2018-02-22 NOTE — Addendum Note (Signed)
Addended by: Drucilla Schmidt on: 02/22/2018 01:56 PM   Modules accepted: Orders

## 2018-02-22 NOTE — Telephone Encounter (Signed)
Sent!

## 2018-02-22 NOTE — Telephone Encounter (Signed)
Tyler Aas is not covered the pharmacy would like Levemir or Basaglar. Please advise

## 2018-02-22 NOTE — Telephone Encounter (Signed)
Okay to use Basaglar.  Same dose.

## 2018-02-23 ENCOUNTER — Ambulatory Visit: Payer: 59 | Admitting: Internal Medicine

## 2018-02-28 ENCOUNTER — Other Ambulatory Visit: Payer: Self-pay | Admitting: Internal Medicine

## 2018-03-17 ENCOUNTER — Telehealth: Payer: Self-pay | Admitting: Cardiovascular Disease

## 2018-03-17 NOTE — Telephone Encounter (Signed)
New Message   STAT if patient feels like he/she is going to faint   1) Are you dizzy now? no  2) Do you feel faint or have you passed out? Felt faint yesterday but did not pass out when he was playing golf   3) Do you have any other symptoms? He said he had palpitations and felt like he had a irregular heartbeat. Patient does not have a history of palps or irregular heartbeat.    4) Have you checked your HR and BP (record if available)? No

## 2018-03-17 NOTE — Telephone Encounter (Signed)
Spoke with pt. Pt sts that yesterday while playing golf he began to feel tired, sob and felt faint. He placed his finger on his radial pulse and felt that his heart was racing. He continues to play and completed his game. This feeling was unusual for him since he exercises regularly. He did not have his vital signs checked. He has an app on his phone and later checked his HR it was 80bpm.  Pt is currently asymptomatic and has not had any reoccurrences. He can feel a short run of palpitations every couple of hours. He spoke with a friend who is a physician and was told he should f/u with his Cardiologist or PCP for an EKG.  appt scheduled with Jory Sims, D-NP on 03/20/18 @ 3:30pm. Pt aware of appt and voiced appreciation for the assistance.

## 2018-03-18 DIAGNOSIS — R002 Palpitations: Secondary | ICD-10-CM | POA: Diagnosis not present

## 2018-03-18 DIAGNOSIS — R0609 Other forms of dyspnea: Secondary | ICD-10-CM | POA: Diagnosis not present

## 2018-03-18 DIAGNOSIS — M6282 Rhabdomyolysis: Secondary | ICD-10-CM | POA: Diagnosis not present

## 2018-03-18 DIAGNOSIS — R0600 Dyspnea, unspecified: Secondary | ICD-10-CM | POA: Diagnosis not present

## 2018-03-18 NOTE — H&P (View-Only) (Signed)
Cardiology Office Note   Date:  03/20/2018   ID:  CHISTOPHER West, DOB 1956/06/04, MRN [id number]  PCP:  Binnie Rail, MD  Cardiologist: Dr. Oval Linsey Chief Complaint  Patient presents with  . Near Syncope  Needs EKG   History of Present Illness: Luke West is a 62 y.o. male who presents for ongoing assessment and management of multiple cardiovascular risk factors to include type 2 diabetes, and hyperlipidemia, with strong family history of premature coronary artery disease.  Both his brother and his sister had heart attacks in their 61s, his father had a AAA.  The patient was seen most recently by Dr. Oval Linsey on 07/10/2015 to be established with our office, (former patient of Dr. Mar Daring).  It was not recommended that he be placed on a beta-blocker due to bradycardia, he was continued on statin therapy as his LDL remained low at 99.  The patient was to continue regular exercise at a heart healthy diet.  It would be a low threshold for cardiovascular testing if he developed any symptoms.  The patient called our office on 01/14/2018 with complaints of near syncope.  He stated that he had been playing golf and began to feel very tired short of breath and felt chest pressure.   [place] and had sever fatigue, with muscle aches and pains. He felt near syncopal. He went to the ER in [place], where EKG revealed early repolarization, HR in the 50's without acute ST//T wave abnormalities. He was also found to have elevated CK > 2200. He was given hydration, with decrease in CK to 1900. He was advised to continue po hydration. Repeat CK was again elevated 2 days later over 2100.   He reported that he put his finger on his radial pulse and felt his heart was racing.  He has an app on his phone and later checked his heart rate at that time was found to be 80 bpm. Normally runs 50's to 60's  The patient spoke with a friend of his who is a physician (Dr.Magog) and was told  that he should follow-up with cardiology and also have an EKG completed.  Past Medical History:  Diagnosis Date  . DM (diabetes mellitus) ([place])   . Luke West syndrome 2008   Total bilirubin 1.4  . Hyperlipemia   . Raynaud phenomenon     Past Surgical History:  Procedure Laterality Date  . COLONOSCOPY  2005 & 2010   negative X 2; Dr Watt Climes (due 2020)  . SHOULDER SURGERY     post dislocation sequellae  . TONSILLECTOMY AND ADENOIDECTOMY       Current Outpatient Medications  Medication Sig Dispense Refill  . aspirin 81 MG tablet Take 81 mg by mouth daily.     Marland Kitchen atorvastatin (LIPITOR) 20 MG tablet Take 1 tablet (20 mg total) by mouth daily. 90 tablet 3  . Continuous Blood Gluc Sensor (FREESTYLE LIBRE 14 DAY SENSOR) MISC 1 each by Does not apply route every 14 (fourteen) days. 2 each 11  . Insulin Degludec (TRESIBA) 100 UNIT/ML SOLN Inject 12 Units into the skin as directed.    . Insulin Pen Needle (CAREFINE PEN NEEDLES) 32G X 4 MM MISC Use 1x a day 100 each 3  . INVOKANA 100 MG TABS tablet TAKE 1 TABLET BY MOUTH  DAILY 90 tablet 1  . metFORMIN (GLUCOPHAGE-XR) 500 MG 24 hr tablet TAKE 4 TABLETS BY MOUTH  DAILY WITH SUPPER 360  tablet 0  . Multiple Vitamin (MULTIVITAMIN) tablet Take 1 tablet by mouth daily.    . ONGLYZA 5 MG TABS tablet TAKE 1 TABLET BY MOUTH  DAILY 90 tablet 2   No current facility-administered medications for this visit.     Allergies:   Patient has no known allergies.    Social History:  The patient  reports that he quit smoking about 27 years ago. He has never used smokeless tobacco. He reports that he drinks about 14.0 standard drinks of alcohol per week. He reports that he does not use drugs.   Family History:  The patient's family history includes Aneurysm in his father, maternal grandmother, mother, and paternal grandmother; Diabetes in his brother and maternal grandfather; Heart attack (age of onset: 39) in his sister; Hyperlipidemia in his mother; Lung cancer  in his father, paternal grandfather, and paternal uncle.    ROS: All other systems are reviewed and negative. Unless otherwise mentioned in H&P    PHYSICAL EXAM: VS:  BP (!) 148/86   Pulse (!) 41   Ht 5\' 10"  (1.778 m)   Wt 198 lb (89.8 kg)   BMI 28.41 kg/m  , BMI Body mass index is 28.41 kg/m. GEN: Well nourished, well developed, in no acute distress  HEENT: normal  Neck: no JVD, carotid bruits, or masses Cardiac: RRR; no murmurs, rubs, or gallops,no edema  Respiratory:  Clear to auscultation bilaterally, normal work of breathing GI: soft, nontender, nondistended, + BS MS: no deformity or atrophy  Skin: warm and dry, no rash Neuro:  Strength and sensation are intact Psych: euthymic mood, full affect   EKG:  Sinus bradycardia, HR of 41 bpm, with early repolarization.   Recent Labs: 07/13/2017: ALT 15; BUN 25; Creatinine, Ser 1.22; Hemoglobin 15.4; Platelets 160.0; Potassium 5.2; Sodium 135; TSH 4.33    Lipid Panel    Component Value Date/Time   CHOL 215 (H) 07/13/2017 0846   TRIG 387.0 (H) 07/13/2017 0846   HDL 43.40 07/13/2017 0846   CHOLHDL 5 07/13/2017 0846   VLDL 77.4 (H) 07/13/2017 0846   LDLCALC 98 08/13/2016 0739   LDLDIRECT 118.0 07/13/2017 0846      Wt Readings from Last 3 Encounters:  03/20/18 198 lb (89.8 kg)  10/20/17 201 lb 3.2 oz (91.3 kg)  08/31/17 192 lb 12.8 oz (87.5 kg)      Other studies Reviewed: See scanned documents from [facility] ER.   ASSESSMENT AND PLAN:  1. Near syncope: This was associated with severe fatigue, clamminess, and diaphoresis. Due to bradycardia and multiple CVRF to include FH of premature CAD in both parents and sister, Hypercholesterolemia, Type II diabetes, age and symptoms, I recommended cardiac.   I have discussed this with Dr. Sallyanne Kuster who is DOD in the office today, who also discussed need for cardiac cath. Dr. Sallyanne Kuster is in agreement with need to proceed with cardiac cath for definitive evaluation for CAD.   have  also advised him not to plan travel plans this week, as he has a very full calender, which includes traveling to [place] and to [place] in the next 2 weeks.   The patient understands that risks include but are not limited to stroke (1 in 1000), death (1 in 10), kidney failure [usually temporary] (1 in 500), bleeding (1 in 200), allergic reaction [possibly serious] (1 in 200), and agrees to proceed.   He is advised to stop Invokana, and metformin prior to cath, and take 1/2 dose of Trulicity.  2. Bradycardia:  He is not on any AV nodal blocking agents. Dr. Sallyanne Kuster did discuss the remote possibility of PPM if needed, but would not plan for this now.   3. Hypercholesterolemia: Elevated CK. Will stop Lipitor due to elevation. Repeat CK and Sed rate with pre-cath labs.   5.Type II Diabetes:  He has been given instructions concerning his diabetic medications prior to cath.     Current medicines are reviewed at length with the patient today.  He has also been seen by Dr. Sallyanne Kuster as well during this clinic visit.   Labs/ tests ordered today include: Cardiac Cath, pre-cath labs, CK and Sed rate.   Phill Myron. West Pugh, ANP, AACC   03/20/2018 4:44 PM     [facility] [address] [place], [place] [address] Phone: [phone]; Fax: [phone]

## 2018-03-18 NOTE — Progress Notes (Signed)
Cardiology Office Note   Date:  03/20/2018   ID:  Luke West, DOB Nov 04, 1955, MRN [id number]  PCP:  Binnie Rail, MD  Cardiologist: Dr. Oval Linsey Chief Complaint  Patient presents with  . Near Syncope  Needs EKG   History of Present Illness: Luke West is a 62 y.o. male who presents for ongoing assessment and management of multiple cardiovascular risk factors to include type 2 diabetes, and hyperlipidemia, with strong family history of premature coronary artery disease.  Both his brother and his sister had heart attacks in their 29s, his father had a AAA.  The patient was seen most recently by Dr. Oval Linsey on 07/10/2015 to be established with our office, (former patient of Dr. Mar Daring).  It was not recommended that he be placed on a beta-blocker due to bradycardia, he was continued on statin therapy as his LDL remained low at 99.  The patient was to continue regular exercise at a heart healthy diet.  It would be a low threshold for cardiovascular testing if he developed any symptoms.  The patient called our office on 01/14/2018 with complaints of near syncope.  He stated that he had been playing golf and began to feel very tired short of breath and felt chest pressure.   [place] and had sever fatigue, with muscle aches and pains. He felt near syncopal. He went to the ER in [place], where EKG revealed early repolarization, HR in the 50's without acute ST//T wave abnormalities. He was also found to have elevated CK > 2200. He was given hydration, with decrease in CK to 1900. He was advised to continue po hydration. Repeat CK was again elevated 2 days later over 2100.   He reported that he put his finger on his radial pulse and felt his heart was racing.  He has an app on his phone and later checked his heart rate at that time was found to be 80 bpm. Normally runs 50's to 60's  The patient spoke with a friend of his who is a physician (Dr.Magog) and was told  that he should follow-up with cardiology and also have an EKG completed.  Past Medical History:  Diagnosis Date  . DM (diabetes mellitus) ([place])   . [name] syndrome 2008   Total bilirubin 1.4  . Hyperlipemia   . Raynaud phenomenon     Past Surgical History:  Procedure Laterality Date  . COLONOSCOPY  2005 & 2010   negative X 2; Dr Watt Climes (due 2020)  . SHOULDER SURGERY     post dislocation sequellae  . TONSILLECTOMY AND ADENOIDECTOMY       Current Outpatient Medications  Medication Sig Dispense Refill  . aspirin 81 MG tablet Take 81 mg by mouth daily.     Marland Kitchen atorvastatin (LIPITOR) 20 MG tablet Take 1 tablet (20 mg total) by mouth daily. 90 tablet 3  . Continuous Blood Gluc Sensor (FREESTYLE LIBRE 14 DAY SENSOR) MISC 1 each by Does not apply route every 14 (fourteen) days. 2 each 11  . Insulin Degludec (TRESIBA) 100 UNIT/ML SOLN Inject 12 Units into the skin as directed.    . Insulin Pen Needle (CAREFINE PEN NEEDLES) 32G X 4 MM MISC Use 1x a day 100 each 3  . INVOKANA 100 MG TABS tablet TAKE 1 TABLET BY MOUTH  DAILY 90 tablet 1  . metFORMIN (GLUCOPHAGE-XR) 500 MG 24 hr tablet TAKE 4 TABLETS BY MOUTH  DAILY WITH SUPPER 360  tablet 0  . Multiple Vitamin (MULTIVITAMIN) tablet Take 1 tablet by mouth daily.    . ONGLYZA 5 MG TABS tablet TAKE 1 TABLET BY MOUTH  DAILY 90 tablet 2   No current facility-administered medications for this visit.     Allergies:   Patient has no known allergies.    Social History:  The patient  reports that he quit smoking about 27 years ago. He has never used smokeless tobacco. He reports that he drinks about 14.0 standard drinks of alcohol per week. He reports that he does not use drugs.   Family History:  The patient's family history includes Aneurysm in his father, maternal grandmother, mother, and paternal grandmother; Diabetes in his brother and maternal grandfather; Heart attack (age of onset: 13) in his sister; Hyperlipidemia in his mother; Lung cancer  in his father, paternal grandfather, and paternal uncle.    ROS: All other systems are reviewed and negative. Unless otherwise mentioned in H&P    PHYSICAL EXAM: VS:  BP (!) 148/86   Pulse (!) 41   Ht 5\' 10"  (1.778 m)   Wt 198 lb (89.8 kg)   BMI 28.41 kg/m  , BMI Body mass index is 28.41 kg/m. GEN: Well nourished, well developed, in no acute distress  HEENT: normal  Neck: no JVD, carotid bruits, or masses Cardiac: RRR; no murmurs, rubs, or gallops,no edema  Respiratory:  Clear to auscultation bilaterally, normal work of breathing GI: soft, nontender, nondistended, + BS MS: no deformity or atrophy  Skin: warm and dry, no rash Neuro:  Strength and sensation are intact Psych: euthymic mood, full affect   EKG:  Sinus bradycardia, HR of 41 bpm, with early repolarization.   Recent Labs: 07/13/2017: ALT 15; BUN 25; Creatinine, Ser 1.22; Hemoglobin 15.4; Platelets 160.0; Potassium 5.2; Sodium 135; TSH 4.33    Lipid Panel    Component Value Date/Time   CHOL 215 (H) 07/13/2017 0846   TRIG 387.0 (H) 07/13/2017 0846   HDL 43.40 07/13/2017 0846   CHOLHDL 5 07/13/2017 0846   VLDL 77.4 (H) 07/13/2017 0846   LDLCALC 98 08/13/2016 0739   LDLDIRECT 118.0 07/13/2017 0846      Wt Readings from Last 3 Encounters:  03/20/18 198 lb (89.8 kg)  10/20/17 201 lb 3.2 oz (91.3 kg)  08/31/17 192 lb 12.8 oz (87.5 kg)      Other studies Reviewed: See scanned documents from [facility] ER.   ASSESSMENT AND PLAN:  1. Near syncope: This was associated with severe fatigue, clamminess, and diaphoresis. Due to bradycardia and multiple CVRF to include FH of premature CAD in both parents and sister, Hypercholesterolemia, Type II diabetes, age and symptoms, I recommended cardiac.   I have discussed this with Dr. Sallyanne Kuster who is DOD in the office today, who also discussed need for cardiac cath. Dr. Sallyanne Kuster is in agreement with need to proceed with cardiac cath for definitive evaluation for CAD.   have  also advised him not to plan travel plans this week, as he has a very full calender, which includes traveling to [place] and to [place] in the next 2 weeks.   The patient understands that risks include but are not limited to stroke (1 in 1000), death (1 in 18), kidney failure [usually temporary] (1 in 500), bleeding (1 in 200), allergic reaction [possibly serious] (1 in 200), and agrees to proceed.   He is advised to stop Invokana, and metformin prior to cath, and take 1/2 dose of Trulicity.  2. Bradycardia:  He is not on any AV nodal blocking agents. Dr. Sallyanne Kuster did discuss the remote possibility of PPM if needed, but would not plan for this now.   3. Hypercholesterolemia: Elevated CK. Will stop Lipitor due to elevation. Repeat CK and Sed rate with pre-cath labs.   5.Type II Diabetes:  He has been given instructions concerning his diabetic medications prior to cath.     Current medicines are reviewed at length with the patient today.  He has also been seen by Dr. Sallyanne Kuster as well during this clinic visit.   Labs/ tests ordered today include: Cardiac Cath, pre-cath labs, CK and Sed rate.   Phill Myron. West Pugh, ANP, AACC   03/20/2018 4:44 PM    [facility] [address] [place], [place] [address] Phone: [phone]; Fax: [phone]

## 2018-03-19 DIAGNOSIS — R748 Abnormal levels of other serum enzymes: Secondary | ICD-10-CM | POA: Diagnosis not present

## 2018-03-20 ENCOUNTER — Ambulatory Visit: Payer: 59 | Admitting: Adult Health

## 2018-03-20 ENCOUNTER — Encounter: Payer: Self-pay | Admitting: Adult Health

## 2018-03-20 VITALS — BP 148/86 | HR 41 | Ht 70.0 in | Wt 198.0 lb

## 2018-03-20 DIAGNOSIS — Z79899 Other long term (current) drug therapy: Secondary | ICD-10-CM

## 2018-03-20 DIAGNOSIS — E118 Type 2 diabetes mellitus with unspecified complications: Secondary | ICD-10-CM

## 2018-03-20 DIAGNOSIS — R55 Syncope and collapse: Secondary | ICD-10-CM

## 2018-03-20 DIAGNOSIS — R001 Bradycardia, unspecified: Secondary | ICD-10-CM | POA: Diagnosis not present

## 2018-03-20 DIAGNOSIS — E7800 Pure hypercholesterolemia, unspecified: Secondary | ICD-10-CM | POA: Diagnosis not present

## 2018-03-20 DIAGNOSIS — Z794 Long term (current) use of insulin: Secondary | ICD-10-CM

## 2018-03-20 NOTE — Patient Instructions (Addendum)
    Luke West [place] [address] Dept: [phone] Loc: [place]  03/20/2018  You are scheduled for a Cardiac Catheterization on Tuesday, September 24 with Dr. Peter Martinique.  1. Please arrive at the [facility] (Main Entrance A) at [facility]: [address] [place], [place] [address] at 11:00 AM (This time is two hours before your procedure to ensure your preparation). Free valet parking service is available.   Special note: Every effort is made to have your procedure done on time. Please understand that emergencies sometimes delay scheduled procedures.  2. Diet: Do not eat solid foods after midnight.  The patient may have clear liquids until 5am upon the day of the procedure.  3. Labs: You will need to have blood drawn on  September 23 at [facility]  Open: [place] (Lunch 12:30 - 1:30)   Phone: [phone]. You do not need to be fasting.  4. Medication instructions in preparation for your procedure:   Contrast Allergy: No   HOLD ATORVASTATIN THE MORNING OF THE PROCEDURE  DIABETIC MEDICATIONS:  1-TRESIBA HOLD THE NIGHT BEFORE AND 1/2 DOSE THE MORNING OF PROCEDURE  2-HOLD METFORMIN THE NIGHT BEFORE, DAY OF PROCEDURE AND THE DAY AFTER PROCEDURE 3-HOLD INVOKANA THE MORNING OF THE PROCEDURE   On the morning of your procedure, take your Aspirin and any morning medicines NOT listed above.  You may use sips of water.  5. Plan for one night stay--bring personal belongings. 6. Bring a current list of your medications and current insurance cards. 7. You MUST have a responsible person to drive you home. 8. Someone MUST be with you the first 24 hours after you arrive home or your discharge will be delayed. 9. Please wear clothes that are easy to get on and off and wear slip-on shoes.  Thank you for allowing [place] to  care for you!   -- [facility]

## 2018-03-21 ENCOUNTER — Ambulatory Visit (HOSPITAL_COMMUNITY)
Admission: RE | Admit: 2018-03-21 | Discharge: 2018-03-21 | Disposition: A | Discharge status: Home / Self Care | Payer: 59 | Source: Ambulatory Visit | Attending: Cardiology | Admitting: Cardiology

## 2018-03-21 ENCOUNTER — Other Ambulatory Visit: Payer: Self-pay

## 2018-03-21 ENCOUNTER — Encounter (HOSPITAL_COMMUNITY)
Admission: RE | Disposition: A | Discharge status: Home / Self Care | Payer: Self-pay | Source: Ambulatory Visit | Attending: Cardiology

## 2018-03-21 DIAGNOSIS — R0789 Other chest pain: Secondary | ICD-10-CM | POA: Diagnosis not present

## 2018-03-21 DIAGNOSIS — E7849 Other hyperlipidemia: Secondary | ICD-10-CM | POA: Diagnosis present

## 2018-03-21 DIAGNOSIS — Z87891 Personal history of nicotine dependence: Secondary | ICD-10-CM | POA: Insufficient documentation

## 2018-03-21 DIAGNOSIS — E785 Hyperlipidemia, unspecified: Secondary | ICD-10-CM | POA: Insufficient documentation

## 2018-03-21 DIAGNOSIS — M6282 Rhabdomyolysis: Secondary | ICD-10-CM | POA: Diagnosis present

## 2018-03-21 DIAGNOSIS — Z7982 Long term (current) use of aspirin: Secondary | ICD-10-CM | POA: Insufficient documentation

## 2018-03-21 DIAGNOSIS — R079 Chest pain, unspecified: Secondary | ICD-10-CM | POA: Diagnosis present

## 2018-03-21 DIAGNOSIS — E7800 Pure hypercholesterolemia, unspecified: Secondary | ICD-10-CM | POA: Diagnosis not present

## 2018-03-21 DIAGNOSIS — Z8249 Family history of ischemic heart disease and other diseases of the circulatory system: Secondary | ICD-10-CM | POA: Insufficient documentation

## 2018-03-21 DIAGNOSIS — E119 Type 2 diabetes mellitus without complications: Secondary | ICD-10-CM | POA: Diagnosis present

## 2018-03-21 DIAGNOSIS — Z794 Long term (current) use of insulin: Secondary | ICD-10-CM | POA: Diagnosis not present

## 2018-03-21 DIAGNOSIS — E782 Mixed hyperlipidemia: Secondary | ICD-10-CM | POA: Diagnosis present

## 2018-03-21 DIAGNOSIS — R55 Syncope and collapse: Secondary | ICD-10-CM | POA: Diagnosis present

## 2018-03-21 DIAGNOSIS — E1169 Type 2 diabetes mellitus with other specified complication: Secondary | ICD-10-CM | POA: Diagnosis present

## 2018-03-21 DIAGNOSIS — I7300 Raynaud's syndrome without gangrene: Secondary | ICD-10-CM | POA: Diagnosis not present

## 2018-03-21 HISTORY — PX: LEFT HEART CATH AND CORONARY ANGIOGRAPHY: CATH118249

## 2018-03-21 LAB — BASIC METABOLIC PANEL
BUN/Creatinine Ratio: 13 (ref 10–24)
BUN: 14 mg/dL (ref 8–27)
CHLORIDE: 101 mmol/L (ref 96–106)
CO2: 25 mmol/L (ref 20–29)
CREATININE: 1.12 mg/dL (ref 0.76–1.27)
Calcium: 10.5 mg/dL — ABNORMAL HIGH (ref 8.6–10.2)
GFR calc Af Amer: 82 mL/min/{1.73_m2} (ref >59)
GFR calc non Af Amer: 71 mL/min/{1.73_m2} (ref >59)
GLUCOSE: 128 mg/dL — AB (ref 65–99)
Potassium: 4.9 mmol/L (ref 3.5–5.2)
SODIUM: 142 mmol/L (ref 134–144)

## 2018-03-21 LAB — CBC
HEMOGLOBIN: 15.6 g/dL (ref 13.0–17.7)
Hematocrit: 45 % (ref 37.5–51.0)
MCH: 32.6 pg (ref 26.6–33.0)
MCHC: 34.7 g/dL (ref 31.5–35.7)
MCV: 94 fL (ref 79–97)
PLATELETS: 178 10*3/uL (ref 150–450)
RBC: 4.78 x10E6/uL (ref 4.14–5.80)
RDW: 11.9 % — ABNORMAL LOW (ref 12.3–15.4)
WBC: 5.6 10*3/uL (ref 3.4–10.8)

## 2018-03-21 LAB — CK: Total CK: 1109 U/L (ref 24–204)

## 2018-03-21 LAB — GLUCOSE, CAPILLARY
GLUCOSE-CAPILLARY: 193 mg/dL — AB (ref 70–99)
GLUCOSE-CAPILLARY: 132 mg/dL — AB (ref 70–99)

## 2018-03-21 LAB — SEDIMENTATION RATE: SED RATE: 2 mm/h (ref 0–30)

## 2018-03-21 SURGERY — LEFT HEART CATH AND CORONARY ANGIOGRAPHY
Anesthesia: LOCAL

## 2018-03-21 MED ORDER — HEPARIN (PORCINE) IN NACL 1000-0.9 UT/500ML-% IV SOLN
INTRAVENOUS | Status: DC | PRN
  Administered 2018-03-21 (×3): 500 mL

## 2018-03-21 MED ORDER — HEPARIN SODIUM (PORCINE) 1000 UNIT/ML IJ SOLN
INTRAMUSCULAR | Status: AC
  Filled 2018-03-21: qty 1

## 2018-03-21 MED ORDER — LIDOCAINE HCL (PF) 1 % IJ SOLN
INTRAMUSCULAR | Status: AC
  Filled 2018-03-21: qty 30

## 2018-03-21 MED ORDER — HEPARIN (PORCINE) IN NACL 1000-0.9 UT/500ML-% IV SOLN
INTRAVENOUS | Status: AC
  Filled 2018-03-21: qty 500

## 2018-03-21 MED ORDER — ASPIRIN 81 MG PO CHEW: 81.0000 mg | CHEWABLE_TABLET | ORAL | Status: DC

## 2018-03-21 MED ORDER — ONDANSETRON HCL 4 MG/2ML IJ SOLN: 4.0000 mg | Freq: Four times a day (QID) | INTRAMUSCULAR | Status: DC | PRN

## 2018-03-21 MED ORDER — SODIUM CHLORIDE 0.9% FLUSH: 3.0000 mL | INTRAVENOUS | Status: DC | PRN

## 2018-03-21 MED ORDER — SODIUM CHLORIDE 0.9 % WEIGHT BASED INFUSION: 1.0000 mL/kg/h | INTRAVENOUS | Status: DC

## 2018-03-21 MED ORDER — SODIUM CHLORIDE 0.9% FLUSH: 3.0000 mL | Freq: Two times a day (BID) | INTRAVENOUS | Status: DC

## 2018-03-21 MED ORDER — FENTANYL CITRATE (PF) 100 MCG/2ML IJ SOLN
INTRAMUSCULAR | Status: DC | PRN
  Administered 2018-03-21: 25 ug via INTRAVENOUS

## 2018-03-21 MED ORDER — ACETAMINOPHEN 325 MG PO TABS: 650.0000 mg | ORAL_TABLET | ORAL | Status: DC | PRN

## 2018-03-21 MED ORDER — SODIUM CHLORIDE 0.9 % IV SOLN: 250.0000 mL | INTRAVENOUS | Status: DC | PRN

## 2018-03-21 MED ORDER — VERAPAMIL HCL 2.5 MG/ML IV SOLN
INTRAVENOUS | Status: DC | PRN
  Administered 2018-03-21: 10 mL via INTRA_ARTERIAL

## 2018-03-21 MED ORDER — HEPARIN (PORCINE) IN NACL 2000-0.9 UNIT/L-% IV SOLN: INTRAVENOUS | Status: DC | PRN

## 2018-03-21 MED ORDER — SODIUM CHLORIDE 0.9 % WEIGHT BASED INFUSION
3.0000 mL/kg/h | INTRAVENOUS | Status: AC
  Administered 2018-03-21: 3 mL/kg/h via INTRAVENOUS

## 2018-03-21 MED ORDER — IOHEXOL 350 MG/ML SOLN
INTRAVENOUS | Status: DC | PRN
  Administered 2018-03-21: 70 mL via INTRA_ARTERIAL

## 2018-03-21 MED ORDER — FENTANYL CITRATE (PF) 100 MCG/2ML IJ SOLN
INTRAMUSCULAR | Status: AC
  Filled 2018-03-21: qty 2

## 2018-03-21 MED ORDER — VERAPAMIL HCL 2.5 MG/ML IV SOLN
INTRAVENOUS | Status: AC
  Filled 2018-03-21: qty 2

## 2018-03-21 MED ORDER — MIDAZOLAM HCL 2 MG/2ML IJ SOLN
INTRAMUSCULAR | Status: DC | PRN
  Administered 2018-03-21: 1 mg via INTRAVENOUS

## 2018-03-21 MED ORDER — MIDAZOLAM HCL 2 MG/2ML IJ SOLN
INTRAMUSCULAR | Status: AC
  Filled 2018-03-21: qty 2

## 2018-03-21 MED ORDER — HEPARIN SODIUM (PORCINE) 1000 UNIT/ML IJ SOLN
INTRAMUSCULAR | Status: DC | PRN
  Administered 2018-03-21: 4500 [IU] via INTRAVENOUS

## 2018-03-21 MED ORDER — LIDOCAINE HCL (PF) 1 % IJ SOLN
INTRAMUSCULAR | Status: DC | PRN
  Administered 2018-03-21: 2 mL

## 2018-03-21 MED ORDER — METFORMIN HCL ER 500 MG PO TB24: 1000.0000 mg | ORAL_TABLET | Freq: Every day | ORAL | Status: DC

## 2018-03-21 SURGICAL SUPPLY — 10 items

## 2018-03-21 NOTE — Discharge Instructions (Signed)
Drink plenty of fluids over 48 hours and keep right wrist elevated at heart level for 24 hours  Radial Site Care Refer to this sheet in the next few weeks. These instructions provide you with information about caring for yourself after your procedure. Your health care provider may also give you more specific instructions. Your treatment has been planned according to current medical practices, but problems sometimes occur. Call your health care provider if you have any problems or questions after your procedure. What can I expect after the procedure? After your procedure, it is typical to have the following:  Bruising at the radial site that usually fades within 1-2 weeks.  Blood collecting in the tissue (hematoma) that may be painful to the touch. It should usually decrease in size and tenderness within 1-2 weeks.  Follow these instructions at home:  Take medicines only as directed by your health care provider.  You may shower 24-48 hours after the procedure or as directed by your health care provider. Remove the bandage (dressing) and gently wash the site with plain soap and water. Pat the area dry with a clean towel. Do not rub the site, because this may cause bleeding.  Do not take baths, swim, or use a hot tub until your health care provider approves.  Check your insertion site every day for redness, swelling, or drainage.  Do not apply powder or lotion to the site.  Do not flex or bend the affected arm for 24 hours or as directed by your health care provider.  Do not push or pull heavy objects with the affected arm for 24 hours or as directed by your health care provider.  Do not lift over 10 lb (4.5 kg) for 5 days after your procedure or as directed by your health care provider.  Ask your health care provider when it is okay to: ? Return to work or school. ? Resume usual physical activities or sports. ? Resume sexual activity.  Do not drive home if you are discharged the same  day as the procedure. Have someone else drive you.  You may drive 24 hours after the procedure unless otherwise instructed by your health care provider.  Do not operate machinery or power tools for 24 hours after the procedure.  If your procedure was done as an outpatient procedure, which means that you went home the same day as your procedure, a responsible adult should be with you for the first 24 hours after you arrive home.  Keep all follow-up visits as directed by your health care provider. This is important. Contact a health care provider if:  You have a fever.  You have chills.  You have increased bleeding from the radial site. Hold pressure on the site. Get help right away if:  You have unusual pain at the radial site.  You have redness, warmth, or swelling at the radial site.  You have drainage (other than a small amount of blood on the dressing) from the radial site.  The radial site is bleeding, and the bleeding does not stop after 30 minutes of holding steady pressure on the site.  Your arm or hand becomes pale, cool, tingly, or numb. This information is not intended to replace advice given to you by your health care provider. Make sure you discuss any questions you have with your health care provider. Document Released: 07/17/2010 Document Revised: 11/20/2015 Document Reviewed: 12/31/2013 Elsevier Interactive Patient Education  2018 [employer].

## 2018-03-21 NOTE — Research (Signed)
CAD FEM Informed Consent           Subject Name:  Luke West    Subject met inclusion and exclusion criteria.  The informed consent form, study requirements and expectations were reviewed with the subject and questions and concerns were addressed prior to the signing of the consent form.  The subject verbalized understanding of the trial requirements.  The subject agreed to participate in the CAD FEM trial and signed the informed consent.  The informed consent was obtained prior to performance of any protocol-specific procedures for the subject.  A copy of the signed informed consent was given to the subject and a copy was placed in the subject's medical record.   [place] Sabirin Baray, Research Assistant 02/27/2018   12:20 p.m.

## 2018-03-21 NOTE — Progress Notes (Signed)
Thank you! Glad he was clear.

## 2018-03-21 NOTE — Interval H&P Note (Signed)
History and Physical Interval Note:  03/21/2018 2:04 PM  Luke West  has presented today for surgery, with the diagnosis of syncope - bradicardia  The various methods of treatment have been discussed with the patient and family. After consideration of risks, benefits and other options for treatment, the patient has consented to  Procedure(s): LEFT HEART CATH AND CORONARY ANGIOGRAPHY (N/A) as a surgical intervention .  The patient's history has been reviewed, patient examined, no change in status, stable for surgery.  I have reviewed the patient's chart and labs.  Questions were answered to the patient's satisfaction.    Cath Lab Visit (complete for each Cath Lab visit)  Clinical Evaluation Leading to the Procedure:   ACS: Yes.    Non-ACS:    Anginal Classification: CCS III  Anti-ischemic medical therapy: No Therapy  Non-Invasive Test Results: No non-invasive testing performed  Prior CABG: No previous CABG       [name] 03/21/2018 2:05 PM

## 2018-03-22 ENCOUNTER — Encounter (HOSPITAL_COMMUNITY): Payer: Self-pay | Admitting: Cardiology

## 2018-03-28 ENCOUNTER — Encounter (HOSPITAL_COMMUNITY): Payer: Self-pay | Admitting: Cardiology

## 2018-04-05 DIAGNOSIS — N401 Enlarged prostate with lower urinary tract symptoms: Secondary | ICD-10-CM | POA: Diagnosis not present

## 2018-04-05 DIAGNOSIS — R350 Frequency of micturition: Secondary | ICD-10-CM | POA: Diagnosis not present

## 2018-04-12 DIAGNOSIS — N401 Enlarged prostate with lower urinary tract symptoms: Secondary | ICD-10-CM | POA: Diagnosis not present

## 2018-04-12 DIAGNOSIS — R350 Frequency of micturition: Secondary | ICD-10-CM | POA: Diagnosis not present

## 2018-04-16 NOTE — Progress Notes (Signed)
Subjective:    Patient ID: Luke West, male    DOB: 1955-11-15, 62 y.o.   MRN: [id number]  HPI The patient is here for follow up.  SOB, near syncope:  In July he was playing golf and felt very tired and short of breath when he was walking up an incline, which is very unusual.  He also later had some lightheadedness.  He had a very strenuous workout with a trainer that morning and then golf that afternoon and he thought he was just dehydrated.  At this point he does not recall if he drank that much or not.  He did see 1 of his friends, who is a doctor, in the parking lot and told him that his symptoms and he advised that he go to the emergency room to have it evaluated.  He did go later that day and his EKG showed early repolarization, heart rate in the 50s without any acute changes and an elevated CK more than 2200.  He was hydrated and his CK decreased, but was still elevated.  He did see cardiology approximately 1 month ago.  He ended up having a cardiac catheterization that showed normal coronary arteries.  He did stop the Lipitor 1 month ago.  He was advised to follow-up to determine if he should restart the Lipitor or not.  He is felt fine since then and denies any similar symptoms.  Diabetes: he follows with Dr Cruzita Lederer.  He is taking his medication daily as prescribed. He is compliant with a diabetic diet. He is exercising regularly.  He checks his feet daily and denies foot lesions. He is up-to-date with an ophthalmology examination.   Hyperlipidemia: He is not currently taking his medication-stop it 1 month ago as above. He is compliant with a low fat/cholesterol diet. He is exercising regularly. He denies myalgias.    Medications and allergies reviewed with patient and updated if appropriate.  Patient Active Problem List   Diagnosis Date Noted  . Elevated CK 04/19/2018  . Chest pain with high risk for cardiac etiology 03/21/2018  . Near syncope 03/21/2018  . Rhabdomyolysis  03/21/2018  . Family history of abdominal aortic aneurysm (AAA) 07/11/2017  . Psoriasis 03/15/2016  . Rosacea 03/15/2016  . Family history of premature CAD 06/12/2015  . Type 1 diabetes mellitus with diabetic nephropathy ([place]) 04/15/2015  . Nocturnal leg cramps 12/11/2013  . Benign prostatic hyperplasia 03/05/2010  . Mixed hyperlipidemia 09/21/2007  . GOUT 05/11/2007  . RAYNAUD'S SYNDROME 05/11/2007    Current Outpatient Medications on File Prior to Visit  Medication Sig Dispense Refill  . aspirin 81 MG tablet Take 81 mg by mouth daily.     Marland Kitchen atorvastatin (LIPITOR) 20 MG tablet Take 1 tablet (20 mg total) by mouth daily. (Patient taking differently: Take 20 mg by mouth at bedtime. ) 90 tablet 3  . glipiZIDE (GLUCOTROL) 5 MG tablet Take 5 mg by mouth daily before breakfast.    . Insulin Degludec (TRESIBA) 100 UNIT/ML SOLN Inject 12 Units into the skin daily.     . INVOKANA 100 MG TABS tablet TAKE 1 TABLET BY MOUTH  DAILY (Patient taking differently: Take 100 mg by mouth daily before breakfast. ) 90 tablet 1  . metFORMIN (GLUCOPHAGE-XR) 500 MG 24 hr tablet Take 2 tablets (1,000 mg total) by mouth daily with breakfast.    . Multiple Vitamin (MULTIVITAMIN) tablet Take 1 tablet by mouth daily.    . ONGLYZA 5 MG TABS tablet TAKE  1 TABLET BY MOUTH  DAILY (Patient taking differently: Take 5 mg by mouth daily. ) 90 tablet 2   No current facility-administered medications on file prior to visit.     Past Medical History:  Diagnosis Date  . DM (diabetes mellitus) ([place])   . [name] syndrome 2008   Total bilirubin 1.4  . Hyperlipemia   . Raynaud phenomenon     Past Surgical History:  Procedure Laterality Date  . COLONOSCOPY  2005 & 2010   negative X 2; Dr Watt Climes (due 2020)  . LEFT HEART CATH AND CORONARY ANGIOGRAPHY N/A 03/21/2018   Procedure: LEFT HEART CATH AND CORONARY ANGIOGRAPHY;  Surgeon: Martinique, Peter M, MD;  Location: [facility];  Service: Cardiovascular;  Laterality: N/A;    . SHOULDER SURGERY     post dislocation sequellae  . TONSILLECTOMY AND ADENOIDECTOMY      Social History   Socioeconomic History  . Marital status: Married    Spouse name: Not on file  . Number of children: Not on file  . Years of education: Not on file  . Highest education level: Not on file  Occupational History  . Not on file  Social Needs  . Financial resource strain: Not on file  . Food insecurity:    Worry: Not on file    Inability: Not on file  . Transportation needs:    Medical: Not on file    Non-medical: Not on file  Tobacco Use  . Smoking status: Former Smoker    Last attempt to quit: 06/28/1990    Years since quitting: 27.8  . Smokeless tobacco: Never Used  . Tobacco comment: XBJ:YNWGN 3-4 year . Smoked [phone] , up to 1-2 ppd  Substance and Sexual Activity  . Alcohol use: Yes    Alcohol/week: 14.0 standard drinks    Types: 14 Glasses of wine per week    Comment:  socially  . Drug use: No  . Sexual activity: Not on file  Lifestyle  . Physical activity:    Days per week: Not on file    Minutes per session: Not on file  . Stress: Not on file  Relationships  . Social connections:    Talks on phone: Not on file    Gets together: Not on file    Attends religious service: Not on file    Active member of club or organization: Not on file    Attends meetings of clubs or organizations: Not on file    Relationship status: Not on file  Other Topics Concern  . Not on file  Social History Narrative  . Not on file    Family History  Problem Relation Age of Onset  . Aneurysm Mother        cns  . Hyperlipidemia Mother   . Lung cancer Father        smoker  . Aneurysm Father        AAA  . Heart attack Sister 48  . Diabetes Brother        type 1  . Lung cancer Paternal Uncle        smoker  . Aneurysm Maternal Grandmother        cns;PRE 73 (mid 5s)  . Diabetes Maternal Grandfather        type 1  . Lung cancer Paternal Grandfather        smoker  .  Aneurysm Paternal Grandmother        AAA    Review  of Systems  Constitutional: Negative for chills and fever.  Respiratory: Negative for shortness of breath.   Cardiovascular: Negative for chest pain, palpitations and leg swelling.  Neurological: Negative for dizziness, light-headedness and headaches.       Objective:   Vitals:   04/19/18 0746  BP: 124/78  Pulse: 62  Resp: 16  Temp: 97.8 F (36.6 C)  SpO2: 95%   BP Readings from Last 3 Encounters:  04/19/18 124/78  03/21/18 135/74  03/20/18 (!) 148/86   Wt Readings from Last 3 Encounters:  04/19/18 199 lb 1.9 oz (90.3 kg)  03/21/18 198 lb (89.8 kg)  03/20/18 198 lb (89.8 kg)   Body mass index is 28.57 kg/m.   Physical Exam    Constitutional: Appears well-developed and well-nourished. No distress.  HENT:  Head: Normocephalic and atraumatic.  Neck: Neck supple. No tracheal deviation present. No thyromegaly present.  No cervical lymphadenopathy Cardiovascular: Normal rate, regular rhythm and normal heart sounds.   No murmur heard. No carotid bruit .  No edema Pulmonary/Chest: Effort normal and breath sounds normal. No respiratory distress. No has no wheezes. No rales.  Skin: Skin is warm and dry. Not diaphoretic.  Psychiatric: Normal mood and affect. Behavior is normal.      Assessment & Plan:    See Problem List for Assessment and Plan of chronic medical problems.

## 2018-04-17 DIAGNOSIS — E119 Type 2 diabetes mellitus without complications: Secondary | ICD-10-CM | POA: Diagnosis not present

## 2018-04-17 DIAGNOSIS — H0100A Unspecified blepharitis right eye, upper and lower eyelids: Secondary | ICD-10-CM | POA: Diagnosis not present

## 2018-04-17 DIAGNOSIS — H2513 Age-related nuclear cataract, bilateral: Secondary | ICD-10-CM | POA: Diagnosis not present

## 2018-04-17 LAB — HM DIABETES EYE EXAM

## 2018-04-19 ENCOUNTER — Ambulatory Visit: Payer: 59 | Admitting: Internal Medicine

## 2018-04-19 ENCOUNTER — Encounter: Payer: Self-pay | Admitting: Internal Medicine

## 2018-04-19 ENCOUNTER — Other Ambulatory Visit (INDEPENDENT_AMBULATORY_CARE_PROVIDER_SITE_OTHER): Payer: 59

## 2018-04-19 VITALS — BP 124/78 | HR 62 | Temp 97.8°F | Resp 16 | Ht 70.0 in | Wt 199.1 lb

## 2018-04-19 DIAGNOSIS — E782 Mixed hyperlipidemia: Secondary | ICD-10-CM | POA: Diagnosis not present

## 2018-04-19 DIAGNOSIS — R748 Abnormal levels of other serum enzymes: Secondary | ICD-10-CM | POA: Diagnosis not present

## 2018-04-19 DIAGNOSIS — E1021 Type 1 diabetes mellitus with diabetic nephropathy: Secondary | ICD-10-CM | POA: Diagnosis not present

## 2018-04-19 LAB — LIPID PANEL
CHOL/HDL RATIO: 6
Cholesterol: 297 mg/dL — ABNORMAL HIGH (ref 0–200)
HDL: 53.1 mg/dL (ref >39.00)
LDL Cholesterol: 210 mg/dL — ABNORMAL HIGH (ref 0–99)
NONHDL: 243.68
Triglycerides: 167 mg/dL — ABNORMAL HIGH (ref 0.0–149.0)
VLDL: 33.4 mg/dL (ref 0.0–40.0)

## 2018-04-19 LAB — COMPREHENSIVE METABOLIC PANEL
ALT: 13 U/L (ref 0–53)
AST: 17 U/L (ref 0–37)
Albumin: 4.8 g/dL (ref 3.5–5.2)
Alkaline Phosphatase: 64 U/L (ref 39–117)
BILIRUBIN TOTAL: 0.6 mg/dL (ref 0.2–1.2)
BUN: 20 mg/dL (ref 6–23)
CHLORIDE: 103 meq/L (ref 96–112)
CO2: 25 meq/L (ref 19–32)
Calcium: 10.1 mg/dL (ref 8.4–10.5)
Creatinine, Ser: 1.27 mg/dL (ref 0.40–1.50)
GFR: 61.07 mL/min (ref >60.00)
GLUCOSE: 161 mg/dL — AB (ref 70–99)
Potassium: 4.8 mEq/L (ref 3.5–5.1)
Sodium: 138 mEq/L (ref 135–145)
Total Protein: 7.6 g/dL (ref 6.0–8.3)

## 2018-04-19 LAB — HEMOGLOBIN A1C: Hgb A1c MFr Bld: 6.9 % — ABNORMAL HIGH (ref 4.6–6.5)

## 2018-04-19 LAB — CK: Total CK: 65 U/L (ref 7–232)

## 2018-04-19 NOTE — Assessment & Plan Note (Signed)
In July-more than 2200 Possibly related to strenuous workout and dehydration, but statin could also be playing a role Lipitor stopped 1 month ago Check CK and lipid panel today Given his risk for heart disease I think being on a statin would be a good idea so most likely I will recommend restarting it Recheck CK lipid panel in 1 month Has cardiology follow-up in December

## 2018-04-19 NOTE — Assessment & Plan Note (Signed)
Management per Dr. Cruzita Lederer Sugars have been well controlled at home Will check A1c since he is getting blood work done Has follow-up with Dr. Cruzita Lederer

## 2018-04-19 NOTE — Patient Instructions (Addendum)
  Tests ordered today. Your results will be released to MyChart (or called to you) after review, usually within 72hours after test completion. If any changes need to be made, you will be notified at that same time.  Medications reviewed and updated.  Changes include :   none     

## 2018-04-20 ENCOUNTER — Encounter: Payer: Self-pay | Admitting: Internal Medicine

## 2018-05-16 ENCOUNTER — Other Ambulatory Visit: Payer: Self-pay | Admitting: Internal Medicine

## 2018-05-17 ENCOUNTER — Other Ambulatory Visit: Payer: Self-pay

## 2018-05-17 ENCOUNTER — Other Ambulatory Visit: Payer: Self-pay | Admitting: Internal Medicine

## 2018-05-17 MED ORDER — GLIPIZIDE 5 MG PO TABS: 5.0000 mg | ORAL_TABLET | Freq: Every day | ORAL | 2 refills | Status: DC

## 2018-05-22 ENCOUNTER — Other Ambulatory Visit (INDEPENDENT_AMBULATORY_CARE_PROVIDER_SITE_OTHER): Payer: 59

## 2018-05-22 DIAGNOSIS — E7849 Other hyperlipidemia: Secondary | ICD-10-CM

## 2018-05-22 LAB — LIPID PANEL
CHOL/HDL RATIO: 4
CHOLESTEROL: 190 mg/dL (ref 0–200)
HDL: 45.1 mg/dL (ref >39.00)
LDL CALC: 113 mg/dL — AB (ref 0–99)
NONHDL: 144.5
Triglycerides: 158 mg/dL — ABNORMAL HIGH (ref 0.0–149.0)
VLDL: 31.6 mg/dL (ref 0.0–40.0)

## 2018-05-23 ENCOUNTER — Encounter: Payer: Self-pay | Admitting: Internal Medicine

## 2018-06-01 ENCOUNTER — Ambulatory Visit: Payer: 59 | Admitting: Internal Medicine

## 2018-06-01 ENCOUNTER — Encounter: Payer: Self-pay | Admitting: Internal Medicine

## 2018-06-01 VITALS — BP 122/80 | HR 62 | Ht 70.0 in | Wt 205.0 lb

## 2018-06-01 DIAGNOSIS — E7849 Other hyperlipidemia: Secondary | ICD-10-CM | POA: Diagnosis not present

## 2018-06-01 DIAGNOSIS — Z23 Encounter for immunization: Secondary | ICD-10-CM | POA: Diagnosis not present

## 2018-06-01 DIAGNOSIS — E1021 Type 1 diabetes mellitus with diabetic nephropathy: Secondary | ICD-10-CM

## 2018-06-01 NOTE — Patient Instructions (Addendum)
Please continue: - Invokana 100 mg in am. - Onglyza 5 mg in am - Glipizide 5 mg in am, before b'fast - Tresiba 12 units in am, but increase to 14 units during the Holidays  Please come back for a follow-up appointment in 4 months.

## 2018-06-01 NOTE — Progress Notes (Signed)
Patient ID: Luke West, male   DOB: 07-06-1955, 62 y.o.   MRN: [id number]  HPI: Luke West is a 62 y.o.-year-old male, returning for follow-up for DM2, dx in 2003, but diagnosed as DM1 in 08/2017, insulin-dependent since 08/2017, uncontrolled, with complications (CKD). Last visit 7.5 months ago.  He is exercising with a personal trainer >> over the summer he became dehydrated after having an intense workout in the morning and then a golf session >> had a syncopal episode >> ended up going to the hospital and having a heart catheterization >> no obstruction. His CK was very high >> came off Lipitor >> now restarted as his CK normalized afterwards.  Reviewed latest HbA1c levels: Lab Results  Component Value Date   HGBA1C 6.9 (H) 04/19/2018   HGBA1C 8.1 08/31/2017   HGBA1C 6.5 05/03/2017  04/06/2016: HbA1c calculated from fructosamine: 6.08%  Pt is on a regimen of: -  - L and D >> but severe diarrhea >> stopped 3 mo ago - Invokana 100 mg in am. - Onglyza 5 mg in am - Glipizide 5 mg in am, before b'fast - Tresiba 14 >> 12 units in am Previously on [place] and Kombiglyze.  He checks his sugars many times with his libre CGM, however, we could not download this today as he did not have the receiver with him.  However, reviewing the CGM tracings on his phone, they are consistent with prior: Better before meals and slightly higher, but lower than 180 after meals.  Sugars per CGM (25-75%) from last visit: - am:  145-205, 231 >> 100-160 >> 100-150 >> 180-210 >> 90-115 - post exercise: 150-220 (after exercise) >> 240->350 >> 130-180 - before lunch:  120 - 180 >> 150-200 >> 210-280 >> 130-160 - 2h after lunch:  110-210 >> 140-160 >> 180-260 >> 130-170 - before dinner: 100-140 >> 120-150 >> 180-220 >> 110-160 - 2h after dinner: 106-204 >> 130-250 >> 210-280 >> 110-160 - bedtime: 80-160 >> 100-150 >> 170-270 >> 100-140 - nighttime:n/c >> 75-140 Lowest sugar was 41 before dinner >> 140 >>  50s >> 70; he has hypoglycemia awareness in the 70s. Highest sugar was 261 >> >350 >> 200s >> up to 100.  Glucometer: One Touch Ultra Mini  Pt's meals are: - Breakfast: 3 days a week, yoghurt  - Lunch: wide variety - Dinner: meat, veggies, strach - Snacks: 1+ - nuts; crackers + PB, chips and salsa  He continues to exercise 5x a week - cardio + strength.  -+ mild CKD, last BUN/creatinine:  Lab Results  Component Value Date   BUN 20 04/19/2018   CREATININE 1.27 04/19/2018   Lab Results  Component Value Date   GFRNONAA 71 03/20/2018   GFRNONAA 72.64 03/05/2010   GFRNONAA 67.46 09/11/2008   GFRNONAA 52 08/21/2007   GFRNONAA 75 08/22/2006   No MAU: Lab Results  Component Value Date   MICRALBCREAT 0.9 08/13/2016   MICRALBCREAT 0.8 07/17/2014   MICRALBCREAT 0.7 08/10/2013   MICRALBCREAT 0.8 03/02/2012   MICRALBCREAT 1.7 10/27/2011   MICRALBCREAT 1.1 07/26/2011   MICRALBCREAT 0.9 03/05/2010   MICRALBCREAT 2.8 10/10/2008   MICRALBCREAT 29.8 03/21/2008   MICRALBCREAT 3.0 08/22/2006   -+ HL; last set of lipids: Lab Results  Component Value Date   CHOL 190 05/22/2018   HDL 45.10 05/22/2018   LDLCALC 113 (H) 05/22/2018   LDLDIRECT 118.0 07/13/2017   TRIG 158.0 (H) 05/22/2018   CHOLHDL 4 05/22/2018  Back on Lipitor starting 1 mo  ago. - last eye exam was by Dr. Kathrin Penner -04/17/2018: No DR - no numbness and tingling in his feet.  He has a history of Raynaud's phenomenon.  ROS: Constitutional: no weight gain/no weight loss, no fatigue, no subjective hyperthermia, no subjective hypothermia Eyes: no blurry vision, no xerophthalmia ENT: no sore throat, no nodules palpated in neck, no dysphagia, no odynophagia, no hoarseness Cardiovascular: no CP/no SOB/no palpitations/no leg swelling Respiratory: no cough/no SOB/no wheezing Gastrointestinal: no N/no V/no D/no C/no acid reflux Musculoskeletal: no muscle aches/no joint aches Skin: no rashes, no hair loss Neurological: no  tremors/no numbness/no tingling/no dizziness  I reviewed pt's medications, allergies, PMH, social hx, family hx, and changes were documented in the history of present illness. Otherwise, unchanged from my initial visit note.  Past Medical History:  Diagnosis Date  . DM (diabetes mellitus) ([place])   . Luke West syndrome 2008   Total bilirubin 1.4  . Hyperlipemia   . Raynaud phenomenon    Past Surgical History:  Procedure Laterality Date  . COLONOSCOPY  2005 & 2010   negative X 2; Dr Watt Climes (due 2020)  . LEFT HEART CATH AND CORONARY ANGIOGRAPHY N/A 03/21/2018   Procedure: LEFT HEART CATH AND CORONARY ANGIOGRAPHY;  Surgeon: Martinique, Peter M, MD;  Location: [facility];  Service: Cardiovascular;  Laterality: N/A;  . SHOULDER SURGERY     post dislocation sequellae  . TONSILLECTOMY AND ADENOIDECTOMY     History   Social History  . Marital Status: Married    Spouse Name: N/A  . Number of Children: 3   Occupational History  . Real estate development/construction   Social History Main Topics  . Smoking status: Former Smoker    Quit date: 06/28/1990  . Smokeless tobacco: Not on file     Comment: ZOX:WRUEA 3-4 year . Smoked [phone] , up to 1-2 ppd  . Alcohol Use: 8.4 oz/week    14 Glasses of wine per week     Comment:  socially  . Drug Use: No   Current Outpatient Medications on File Prior to Visit  Medication Sig Dispense Refill  . aspirin 81 MG tablet Take 81 mg by mouth daily.     Marland Kitchen atorvastatin (LIPITOR) 20 MG tablet Take 1 tablet (20 mg total) by mouth at bedtime. -- Office visit needed for further refills 90 tablet 0  . glipiZIDE (GLUCOTROL) 5 MG tablet Take 1 tablet (5 mg total) by mouth daily before breakfast. 90 tablet 2  . Insulin Degludec (TRESIBA) 100 UNIT/ML SOLN Inject 12 Units into the skin daily.     . INVOKANA 100 MG TABS tablet TAKE 1 TABLET BY MOUTH  DAILY (Patient taking differently: Take 100 mg by mouth daily before breakfast. ) 90 tablet 1  . metFORMIN  (GLUCOPHAGE-XR) 500 MG 24 hr tablet Take 2 tablets (1,000 mg total) by mouth daily with breakfast.    . Multiple Vitamin (MULTIVITAMIN) tablet Take 1 tablet by mouth daily.    . ONGLYZA 5 MG TABS tablet TAKE 1 TABLET BY MOUTH  DAILY 90 tablet 0   No current facility-administered medications on file prior to visit.    No Known Allergies Family History  Problem Relation Age of Onset  . Aneurysm Mother        cns  . Hyperlipidemia Mother   . Lung cancer Father        smoker  . Aneurysm Father        AAA  . Heart attack Sister 55  .  Diabetes Brother        type 1  . Lung cancer Paternal Uncle        smoker  . Aneurysm Maternal Grandmother        cns;PRE 101 (mid 87s)  . Diabetes Maternal Grandfather        type 1  . Lung cancer Paternal Grandfather        smoker  . Aneurysm Paternal Grandmother        AAA   PE: BP 122/80   Pulse 62   Ht 5\' 10"  (1.778 m) Comment: measured  Wt 205 lb (93 kg)   SpO2 95%   BMI 29.41 kg/m  Body mass index is 29.41 kg/m. Wt Readings from Last 3 Encounters:  06/01/18 205 lb (93 kg)  04/19/18 199 lb 1.9 oz (90.3 kg)  03/21/18 198 lb (89.8 kg)   Constitutional: overweight, in NAD Eyes: PERRLA, EOMI, no exophthalmos ENT: moist mucous membranes, no thyromegaly, no cervical lymphadenopathy Cardiovascular: RRR, No MRG Respiratory: CTA B Gastrointestinal: abdomen soft, NT, ND, BS+ Musculoskeletal: no deformities, strength intact in all 4 Skin: moist, warm, no rashes Neurological: no tremor with outstretched hands, DTR normal in all 4   ASSESSMENT: 1. DM1, uncontrolled, with complications - CKD  We diagnosed insulin deficiency, consistent with a diagnosis of type 1 diabetes.  No detectable autoimmunity. Component     Latest Ref Rng & Units 09/02/2017  ZNT8 Antibodies     U/mL <15  Glutamic Acid Decarb Ab     <5 IU/mL <5  C-Peptide     0.80 - 3.85 ng/mL 0.51 (L)  Glucose, Plasma     65 - 99 mg/dL 141 (H)  Islet Cell Ab     Neg:<1:1  Negative   2. HL  3.  Overweight  PLAN:  1. Patient with long standing, insulin deficient DM, initially on oral antidiabetic regimen: With basal insulin added in the beginning of the year.  His sugars improved significantly afterwards.  He was then lost for follow-up after we increased the insulin slightly at last visit.  Latest HbA1c was 8.1%, before starting insulin.Reviewed latest HbA1c, which was improved, at 6.9%. -His sugars checked by his CGM appear to be at goal, with higher peaks after meals, but usually not higher than 180.  Even though he does have insulin deficiency, it does not appear that he needs mealtime insulin for now. -He continues to exercise consistently, without having lows after exercise.  He is usually actually hyperglycemic after exercise but this peak is now smaller than before. -No changes are needed in his regimen for now, however, I did advise him to increase the [place] by 2 units during the holidays.  Otherwise, we can continue the SGLT 2 inhibitor, DPP 4 inhibitor and sulfonylurea.  If sugars continue to stay controlled, I would like to stop glipizide at next visit. - I advised him to: Patient Instructions  Please continue: - Invokana 100 mg in am. - Onglyza 5 mg in am - Glipizide 5 mg in am, before b'fast - Tresiba 12 units in am, but increase to 14 units during the Holidays  Please come back for a follow-up appointment in 4 months.  - continue checking sugars at different times of the day - check 4x a day, rotating checks - advised for yearly eye exams >> he is UTD - Return to clinic in 4 mo with sugar log    2. HL - Reviewed latest lipid panel from last mo: LDL still  above target, but much improved after restarted Lipitor, TG slightly high Lab Results  Component Value Date   CHOL 190 05/22/2018   HDL 45.10 05/22/2018   LDLCALC 113 (H) 05/22/2018   LDLDIRECT 118.0 07/13/2017   TRIG 158.0 (H) 05/22/2018   CHOLHDL 4 05/22/2018  - Continues Lipitor  without side effects.  3.  Overweight -BMI in the overweight range -He gained weight since last visit, possibly due to insulin -He continues to exercise consistently -We will also continue Invokana which should also help with weight loss   Philemon Kingdom, MD PhD [facility] Endocrinology

## 2018-06-20 ENCOUNTER — Encounter: Payer: Self-pay | Admitting: Cardiovascular Disease

## 2018-06-20 ENCOUNTER — Ambulatory Visit: Payer: 59 | Admitting: Cardiovascular Disease

## 2018-06-20 VITALS — BP 141/76 | HR 46 | Ht 70.0 in | Wt 204.2 lb

## 2018-06-20 DIAGNOSIS — R001 Bradycardia, unspecified: Secondary | ICD-10-CM | POA: Diagnosis not present

## 2018-06-20 DIAGNOSIS — Z79899 Other long term (current) drug therapy: Secondary | ICD-10-CM

## 2018-06-20 DIAGNOSIS — E7800 Pure hypercholesterolemia, unspecified: Secondary | ICD-10-CM | POA: Diagnosis not present

## 2018-06-20 MED ORDER — ATORVASTATIN CALCIUM 40 MG PO TABS: 20.0000 mg | ORAL_TABLET | Freq: Every day | ORAL | Status: DC

## 2018-06-20 NOTE — Patient Instructions (Addendum)
Medication Instructions:  Increase: Lipitor 40 mg daily   If you need a refill on your cardiac medications before your next appointment, please call your pharmacy.   Lab work: Your physician recommends that you return for lab work in 2 months ( Lipid, CMP)   Testing/Procedures: None  Follow-Up: At [facility], you and your health needs are our priority.  As part of our continuing mission to provide you with exceptional heart care, we have created designated Provider Care Teams.  These Care Teams include your primary Cardiologist (physician) and Advanced Practice Providers (APPs -  Physician Assistants and Nurse Practitioners) who all work together to provide you with the care you need, when you need it.  Your physician recommends that you schedule a follow-up appointment in 1 month with Dr. Oval Linsey

## 2018-06-20 NOTE — Progress Notes (Signed)
Cardiology Office Note   Date:  06/20/2018   ID:  ROLDAN LAFOREST, DOB 10-28-1955, MRN [id number]  PCP:  Binnie Rail, MD Stacu Burns Cardiologist:   Skeet Latch, MD   Chief Complaint  Patient presents with  . Follow-up      History of Present Illness: Luke West is a 62 y.o. male with diabetes mellitus type 2 and hyperlipidemia here for follow up.  He was initially seen 06/2015 to establish care.  Mr. Wangerin has a family history of premature coronary artery disease.  His brother and sister both had heart attacks in their 59s.  His father had a AAA.  He was previously a patient of Dr. Mar Daring and underwent stress testing 10 years ago that was negative for ischemia.  At his last appointment he was exercising regularly and had no symptoms.  He was already on aspirin and a statin.  We discussed the pros and cons of coronary calcium scoring and elected to continue with medical management, diet, and exercise.  Since that appointment he had been doing well.  In July 2019 he had an episode of near syncope.  He had a heavy workout with a personal trainer and felt nearly exhausted afterwards.  He did a lot of leg exercises.  The following day he went to play golf and felt very tired and short of breath.  He had difficulty walking up the hills and felt dizzy when he got to the top of the hill.  He has several physician friends who urged him to get an EKG performed.  When he was unable to do so he went to his local emergency department where EKG was unchanged from prior.  However, he was found to have rhabdomyolysis with CK greater than 2200.  He was given IV fluids and his statin was held.  He was subsequently seen in our office and underwent left heart catheterization 02/2018 that revealed normal coronaries.  Since that time Mr. Garman has felt well.  He continues to exercise 5 or 6 days/week and has no chest pain or shortness of breath.  He has not experienced any recurrent leg pain,  lightheadedness, or dizziness.  He denies any syncope or presyncope.  His endocrinologist restarted his statin 2 months ago after his LDL was found to be 210.  Repeat labs 04/2018 revealed LDL 113.   Past Medical History:  Diagnosis Date  . DM (diabetes mellitus) ([place])   . [name] syndrome 2008   Total bilirubin 1.4  . Hyperlipemia   . Raynaud phenomenon     Past Surgical History:  Procedure Laterality Date  . COLONOSCOPY  2005 & 2010   negative X 2; Dr Watt Climes (due 2020)  . LEFT HEART CATH AND CORONARY ANGIOGRAPHY N/A 03/21/2018   Procedure: LEFT HEART CATH AND CORONARY ANGIOGRAPHY;  Surgeon: Martinique, Peter M, MD;  Location: [facility];  Service: Cardiovascular;  Laterality: N/A;  . SHOULDER SURGERY     post dislocation sequellae  . TONSILLECTOMY AND ADENOIDECTOMY       Current Outpatient Medications  Medication Sig Dispense Refill  . aspirin 81 MG tablet Take 81 mg by mouth daily.     Marland Kitchen atorvastatin (LIPITOR) 20 MG tablet Take 1 tablet (20 mg total) by mouth at bedtime. -- Office visit needed for further refills 90 tablet 0  . glipiZIDE (GLUCOTROL) 5 MG tablet Take 1 tablet (5 mg total) by mouth daily before breakfast. 90 tablet 2  . Insulin  Degludec (TRESIBA) 100 UNIT/ML SOLN Inject 12 Units into the skin daily.     . INVOKANA 100 MG TABS tablet TAKE 1 TABLET BY MOUTH  DAILY (Patient taking differently: Take 100 mg by mouth daily before breakfast. ) 90 tablet 1  . Multiple Vitamin (MULTIVITAMIN) tablet Take 1 tablet by mouth daily.    . ONGLYZA 5 MG TABS tablet TAKE 1 TABLET BY MOUTH  DAILY 90 tablet 0   No current facility-administered medications for this visit.     Allergies:   Patient has no known allergies.    Social History:  The patient  reports that he quit smoking about 27 years ago. He has never used smokeless tobacco. He reports current alcohol use of about 14.0 standard drinks of alcohol per week. He reports that he does not use drugs.   Family History:   The patient's family history includes Aneurysm in his father, maternal grandmother, mother, and paternal grandmother; Diabetes in his brother and maternal grandfather; Heart attack (age of onset: 51) in his sister; Hyperlipidemia in his mother; Lung cancer in his father, paternal grandfather, and paternal uncle.    ROS:  Please see the history of present illness.   Otherwise, review of systems are positive for none.   All other systems are reviewed and negative.    PHYSICAL EXAM: VS:  BP (!) 141/76   Pulse (!) 46   Ht 5\' 10"  (1.778 m)   Wt 204 lb 3.2 oz (92.6 kg)   BMI 29.30 kg/m  , BMI Body mass index is 29.3 kg/m. GENERAL:  Well appearing HEENT: Pupils equal round and reactive, fundi not visualized, oral mucosa unremarkable NECK:  No jugular venous distention, waveform within normal limits, carotid upstroke brisk and symmetric, no bruits, no thyromegaly LUNGS:  Clear to auscultation bilaterally HEART:  Bradycardic.  Regular rhythm.  PMI not displaced or sustained,S1 and S2 within normal limits, no S3, no S4, no clicks, no rubs, no murmurs ABD:  Flat, positive bowel sounds normal in frequency in pitch, no bruits, no rebound, no guarding, no midline pulsatile mass, no hepatomegaly, no splenomegaly EXT:  2 plus pulses throughout, no edema, no cyanosis no clubbing SKIN:  No rashes no nodules NEURO:  Cranial nerves II through XII grossly intact, motor grossly intact throughout PSYCH:  Cognitively intact, oriented to person place and time   EKG:  EKG is ordered today. The ekg ordered 07/10/15 demonstrates sinus bradycardia.  Rate 47 bpm.  LVH with repolarization abnormality.  06/20/18: Sinus bradycardia.  Sinus arrhythmia.  Rate 46 bpm.  First degree AV block.  LVH with repolarization abnormality.    Recent Labs: 07/13/2017: TSH 4.33 03/20/2018: Hemoglobin 15.6; Platelets 178 04/19/2018: ALT 13; BUN 20; Creatinine, Ser 1.27; Potassium 4.8; Sodium 138    Lipid Panel    Component Value  Date/Time   CHOL 190 05/22/2018 0757   TRIG 158.0 (H) 05/22/2018 0757   HDL 45.10 05/22/2018 0757   CHOLHDL 4 05/22/2018 0757   VLDL 31.6 05/22/2018 0757   LDLCALC 113 (H) 05/22/2018 0757   LDLDIRECT 118.0 07/13/2017 0846      Wt Readings from Last 3 Encounters:  06/20/18 204 lb 3.2 oz (92.6 kg)  06/01/18 205 lb (93 kg)  04/19/18 199 lb 1.9 oz (90.3 kg)      ASSESSMENT AND PLAN:  # Cardiovascular risk:   # Hyperlipidemia: Mr. Stump had a cardiac cath 02/2018 that showed normal coronaries.  Given his DM, hyperlipidemia and family history, continue primary prevention  with aspirin and atorvastatin.  We will increase atorvastatin to 40mg .  Repeat lipids/CMP in 2 months.  Goal LDL is <70.  We discussed avoiding crossfit/P90X/heavy lifting exercises on a statin, as this raises the risk of rhabdomyolysis.  # Hypertension: BP elevated today both initially and on repeat, but has been mostly controlled at appointments.  He will get a BP cuff and track at home.    Current medicines are reviewed at length with the patient today.  The patient does not have concerns regarding medicines.  The following changes have been made:  no change  Labs/ tests ordered today include:   No orders of the defined types were placed in this encounter.    Disposition:   FU with Jordell Outten C. Oval Linsey, MD, [facility] in 1 month.     Signed, Arienne Gartin C. Oval Linsey, MD, [facility]  06/20/2018 8:52 AM    [facility]

## 2018-07-24 ENCOUNTER — Other Ambulatory Visit: Payer: Self-pay | Admitting: Internal Medicine

## 2018-07-27 ENCOUNTER — Ambulatory Visit: Payer: 59 | Admitting: Medical

## 2018-07-27 ENCOUNTER — Encounter: Payer: Self-pay | Admitting: Cardiology

## 2018-07-27 ENCOUNTER — Ambulatory Visit: Payer: 59 | Admitting: Cardiovascular Disease

## 2018-07-27 VITALS — BP 134/80 | HR 56 | Ht 70.0 in | Wt 201.4 lb

## 2018-07-27 DIAGNOSIS — I10 Essential (primary) hypertension: Secondary | ICD-10-CM | POA: Diagnosis not present

## 2018-07-27 DIAGNOSIS — Z794 Long term (current) use of insulin: Secondary | ICD-10-CM | POA: Diagnosis not present

## 2018-07-27 DIAGNOSIS — E118 Type 2 diabetes mellitus with unspecified complications: Secondary | ICD-10-CM

## 2018-07-27 DIAGNOSIS — Z0389 Encounter for observation for other suspected diseases and conditions ruled out: Secondary | ICD-10-CM

## 2018-07-27 DIAGNOSIS — E782 Mixed hyperlipidemia: Secondary | ICD-10-CM | POA: Diagnosis not present

## 2018-07-27 DIAGNOSIS — IMO0001 Reserved for inherently not codable concepts without codable children: Secondary | ICD-10-CM | POA: Insufficient documentation

## 2018-07-27 NOTE — Patient Instructions (Signed)
Medication Instructions:  Continue current medications If you need a refill on your cardiac medications before your next appointment, please call your pharmacy.   Lab work: FASTING lab work in 1 month to check cholesterol & metabolic panel  If you have labs (blood work) drawn today and your tests are completely normal, you will receive your results only by: Marland Kitchen MyChart Message (if you have MyChart) OR . A paper copy in the mail If you have any lab test that is abnormal or we need to change your treatment, we will call you to review the results.  Testing/Procedures: NONE  Follow-Up: At [facility], you and your health needs are our priority.  As part of our continuing mission to provide you with exceptional heart care, we have created designated Provider Care Teams.  These Care Teams include your primary Cardiologist (physician) and Advanced Practice Providers (APPs -  Physician Assistants and Nurse Practitioners) who all work together to provide you with the care you need, when you need it. You will need a follow up appointment in 6 months.  Please call our office 2 months in advance to schedule this appointment.  You may see Dr. Oval Linsey or one of the following Advanced Practice Providers on your designated Care Team:   Kerin Ransom, PA-C [name], [place] . Sande Rives, PA-C  Any Other Special Instructions Will Be Listed Below (If Applicable).  Since your BP cuff is about 20 points off compared to the readings in our office, it may be best to obtain a new BP cuff. OMRON is a good brand of blood pressure monitor.

## 2018-07-27 NOTE — Progress Notes (Signed)
Cardiology Office Note   Date:  07/27/2018   ID:  Luke West, DOB [birth year], MRN [id number]  PCP:  Binnie Rail, MD  Cardiologist:  No primary care provider on file. EP: None  Chief Complaint  Patient presents with  . Follow-up    HTN, HLD      History of Present Illness: Luke West is a 62 y.o. male with PMH of HTN, HLD, and DM type 2, who presents for follow-up of his HTN and HLD.  He was last seen by cardiology, Dr. Oval Linsey, outpatient 06/20/2018, at which time he was doing well from a cardiac standpoint. His atorvastatin was increased to 40mg  daily at that time which he has been tolerating well. Prior to this visit, he experienced a near-syncopal episode 12/2017 and was found to have rhabdomyolysis in the setting of an intense work-out session. He underwent cardiac catheterization 02/2018 given significant FH and risk factors of HLD and DM type 2. LHC showed normal coronary arteries.   He returns today for follow-up of his HTN and HLD. He is tolerating atorvastatin 40mg  daily without difficulty. He has been monitoring his BP at home with a reported average of 150s/80s on his monitor. BP was 134/80 on our monitor today, however his BP cuff reported 153/88 at that time. Likely BP cuff is not calibrated properly. Given the discrepancy, likely home BP has been lower than readings. He does report eating out a fair amount and understands that there is increased salt in foods at restaurants. He continues to exercise routinely without chest pain or SOB. No complaints of dizziness, lightheadedness, syncope, muscle cramps, or LE edema.   Past Medical History:  Diagnosis Date  . DM (diabetes mellitus) ([place])   . [name] syndrome 2008   Total bilirubin 1.4  . Hyperlipemia   . Raynaud phenomenon     Past Surgical History:  Procedure Laterality Date  . COLONOSCOPY  2005 & 2010   negative X 2; Dr Watt Climes (due 2020)  . LEFT HEART CATH AND CORONARY ANGIOGRAPHY N/A 03/21/2018   Procedure: LEFT HEART CATH AND CORONARY ANGIOGRAPHY;  Surgeon: Martinique, Peter M, MD;  Location: [facility] [facility];  Service: Cardiovascular;  Laterality: N/A;  . SHOULDER SURGERY     post dislocation sequellae  . TONSILLECTOMY AND ADENOIDECTOMY       Current Outpatient Medications  Medication Sig Dispense Refill  . aspirin 81 MG tablet Take 81 mg by mouth daily.     Marland Kitchen atorvastatin (LIPITOR) 40 MG tablet Take 0.5 tablets (20 mg total) by mouth at bedtime. -- Office visit needed for further refills    . Continuous Blood Gluc Sensor (FREESTYLE LIBRE 14 DAY SENSOR) MISC APPLY SENSOR AS DIRECTED AND REPLACE EVERY 14 DAYS 2 each 11  . glipiZIDE (GLUCOTROL) 5 MG tablet Take 1 tablet (5 mg total) by mouth daily before breakfast. 90 tablet 2  . Insulin Degludec (TRESIBA) 100 UNIT/ML SOLN Inject 12 Units into the skin daily.     . INVOKANA 100 MG TABS tablet TAKE 1 TABLET BY MOUTH  DAILY (Patient taking differently: Take 100 mg by mouth daily before breakfast. ) 90 tablet 1  . Multiple Vitamin (MULTIVITAMIN) tablet Take 1 tablet by mouth daily.    . ONGLYZA 5 MG TABS tablet TAKE 1 TABLET BY MOUTH  DAILY 90 tablet 0   No current facility-administered medications for this visit.     Allergies:   Patient has no known allergies.    Social  History:  The patient  reports that he quit smoking about 28 years ago. He has never used smokeless tobacco. He reports current alcohol use of about 14.0 standard drinks of alcohol per week. He reports that he does not use drugs.   Family History:  The patient's family history includes Aneurysm in his father, maternal grandmother, mother, and paternal grandmother; Diabetes in his brother and maternal grandfather; Heart attack (age of onset: 52) in his sister; Hyperlipidemia in his mother; Lung cancer in his father, paternal grandfather, and paternal uncle.    ROS:  Please see the history of present illness.   Otherwise, review of systems are positive for none.   All  other systems are reviewed and negative.    PHYSICAL EXAM: VS:  BP 134/80   Pulse (!) 56   Ht 5\' 10"  (1.778 m)   Wt 201 lb 6.4 oz (91.4 kg)   BMI 28.90 kg/m  , BMI Body mass index is 28.9 kg/m. GEN: Well nourished, well developed, in no acute distress HEENT: sclera anicteric Neck: no JVD, carotid bruits, or masses Cardiac: RRR; no murmurs, rubs, or gallops, no edema  Respiratory:  clear to auscultation bilaterally, normal work of breathing GI: soft, nontender, nondistended, + BS MS: no deformity or atrophy Skin: warm and dry, no rash Neuro:  Strength and sensation are intact Psych: euthymic mood, full affect   EKG:  EKG is not ordered today.    Recent Labs: 03/20/2018: Hemoglobin 15.6; Platelets 178 04/19/2018: ALT 13; BUN 20; Creatinine, Ser 1.27; Potassium 4.8; Sodium 138    Lipid Panel    Component Value Date/Time   CHOL 190 05/22/2018 0757   TRIG 158.0 (H) 05/22/2018 0757   HDL 45.10 05/22/2018 0757   CHOLHDL 4 05/22/2018 0757   VLDL 31.6 05/22/2018 0757   LDLCALC 113 (H) 05/22/2018 0757   LDLDIRECT 118.0 07/13/2017 0846      Wt Readings from Last 3 Encounters:  07/27/18 201 lb 6.4 oz (91.4 kg)  06/20/18 204 lb 3.2 oz (92.6 kg)  06/01/18 205 lb (93 kg)      Other studies Reviewed: Additional studies/ records that were reviewed today include:   Left heart catheterization 02/2018:  The left ventricular systolic function is normal.  LV end diastolic pressure is normal.  The left ventricular ejection fraction is 55-65% by visual estimate.   1. Normal coronary anatomy 2. Normal LV function 3. Normal LVEDP  Plan: continue risk factor modification    ASSESSMENT AND PLAN:  1. HLD: LDL 144 on lipids 05/22/18. Atorvastatin was increased to 40mg  daily 06/20/18. - Will plan to recheck fasting lipids in 1 month to determine need for further statin adjustment - Will check CMET at that time to monitor liver function  2. HTN: home BP cuff with ~99mmHg  discrepancy in SBP when compared to our office cuff. Based on this discrepancy, home blood pressure likely averaging 130s/80s. He reports eating out a fair amount which is an area for improvement.  - Will not start medications at this time as patient would like to avoid adding medications to his regimen.  - Encourage cooking more meals at home in an effort to limit salt intake - Patient will pick up a new BP monitor for home use and continue to keep a log. He will notify the office if BP persistently elevated >140s/80s  3. DM type 2: A1C 6.9 03/2018. He reported some weight gain since starting invokana.  - Continue close monitoring by his Endocrinologist.  Current medicines are reviewed at length with the patient today.  The patient does not have concerns regarding medicines.  The following changes have been made:  no change  Labs/ tests ordered today include: Will check FLP and CMET in 1 month No orders of the defined types were placed in this encounter.    Disposition:   FU with Dr. Oval Linsey in 6 months  Signed, Abigail Butts, PA-C  07/27/2018 10:33 AM

## 2018-08-02 ENCOUNTER — Other Ambulatory Visit: Payer: Self-pay | Admitting: Internal Medicine

## 2018-08-23 LAB — COMPREHENSIVE METABOLIC PANEL
ALT: 20 IU/L (ref 0–44)
AST: 25 IU/L (ref 0–40)
Albumin/Globulin Ratio: 2.3 — ABNORMAL HIGH (ref 1.2–2.2)
Albumin: 4.9 g/dL — ABNORMAL HIGH (ref 3.8–4.8)
Alkaline Phosphatase: 81 IU/L (ref 39–117)
BUN/Creatinine Ratio: 14 (ref 10–24)
BUN: 18 mg/dL (ref 8–27)
Bilirubin Total: 0.6 mg/dL (ref 0.0–1.2)
CO2: 23 mmol/L (ref 20–29)
Calcium: 9.8 mg/dL (ref 8.6–10.2)
Chloride: 101 mmol/L (ref 96–106)
Creatinine, Ser: 1.26 mg/dL (ref 0.76–1.27)
GFR calc Af Amer: 70 mL/min/{1.73_m2} (ref >59)
GFR calc non Af Amer: 61 mL/min/{1.73_m2} (ref >59)
Globulin, Total: 2.1 g/dL (ref 1.5–4.5)
Glucose: 211 mg/dL — ABNORMAL HIGH (ref 65–99)
Potassium: 4.9 mmol/L (ref 3.5–5.2)
Sodium: 140 mmol/L (ref 134–144)
Total Protein: 7 g/dL (ref 6.0–8.5)

## 2018-08-23 LAB — LIPID PANEL
Chol/HDL Ratio: 4.8 ratio (ref 0.0–5.0)
Cholesterol, Total: 209 mg/dL — ABNORMAL HIGH (ref 100–199)
HDL: 44 mg/dL (ref >39)
LDL Calculated: 129 mg/dL — ABNORMAL HIGH (ref 0–99)
Triglycerides: 182 mg/dL — ABNORMAL HIGH (ref 0–149)
VLDL Cholesterol Cal: 36 mg/dL (ref 5–40)

## 2018-09-05 ENCOUNTER — Telehealth: Payer: Self-pay | Admitting: Cardiovascular Disease

## 2018-09-05 DIAGNOSIS — E782 Mixed hyperlipidemia: Secondary | ICD-10-CM

## 2018-09-05 MED ORDER — ATORVASTATIN CALCIUM 80 MG PO TABS: 80.0000 mg | ORAL_TABLET | Freq: Every day | ORAL | 3 refills | Status: DC

## 2018-09-05 NOTE — Telephone Encounter (Signed)
Spoke to patient -- lab results and instruction given --  Patient aware to do labs in 3 months- mailed  Lab slip

## 2018-09-05 NOTE — Telephone Encounter (Signed)
New message ° ° °Patient is returning call for lab results. °

## 2018-09-05 NOTE — Telephone Encounter (Signed)
Left message to call back  

## 2018-09-11 ENCOUNTER — Other Ambulatory Visit: Payer: Self-pay | Admitting: Internal Medicine

## 2018-09-15 ENCOUNTER — Other Ambulatory Visit: Payer: Self-pay | Admitting: Internal Medicine

## 2018-09-29 ENCOUNTER — Encounter: Payer: Self-pay | Admitting: Internal Medicine

## 2018-09-29 ENCOUNTER — Telehealth: Payer: Self-pay | Admitting: Internal Medicine

## 2018-09-29 NOTE — Telephone Encounter (Signed)
LMTCB to let him know if have changed his appt to [facility] and have sent him the info

## 2018-10-03 ENCOUNTER — Other Ambulatory Visit: Payer: Self-pay

## 2018-10-03 ENCOUNTER — Encounter: Payer: Self-pay | Admitting: Internal Medicine

## 2018-10-03 ENCOUNTER — Ambulatory Visit (INDEPENDENT_AMBULATORY_CARE_PROVIDER_SITE_OTHER): Payer: 59 | Admitting: Internal Medicine

## 2018-10-03 DIAGNOSIS — E663 Overweight: Secondary | ICD-10-CM | POA: Diagnosis not present

## 2018-10-03 DIAGNOSIS — E782 Mixed hyperlipidemia: Secondary | ICD-10-CM

## 2018-10-03 DIAGNOSIS — E119 Type 2 diabetes mellitus without complications: Secondary | ICD-10-CM

## 2018-10-03 MED ORDER — GLIPIZIDE 5 MG PO TABS: 5.0000 mg | ORAL_TABLET | Freq: Every day | ORAL | 3 refills | Status: DC

## 2018-10-03 MED ORDER — SAXAGLIPTIN HCL 5 MG PO TABS: 5.0000 mg | ORAL_TABLET | Freq: Every day | ORAL | 3 refills | Status: DC

## 2018-10-03 MED ORDER — INSULIN DEGLUDEC 100 UNIT/ML ~~LOC~~ SOPN: PEN_INJECTOR | SUBCUTANEOUS | 11 refills | Status: DC

## 2018-10-03 MED ORDER — CANAGLIFLOZIN 100 MG PO TABS: 100.0000 mg | ORAL_TABLET | Freq: Every day | ORAL | 3 refills | Status: DC

## 2018-10-03 NOTE — Patient Instructions (Addendum)
Please continue: - Invokana 100 mg in am. - Onglyza 5 mg in am - Glipizide 5 mg in am, but move this before lunch - Tresiba 14 units daily  Please come back for a follow-up appointment in 4 months.

## 2018-10-03 NOTE — Progress Notes (Signed)
Patient ID: Luke West, male   DOB: 1955/11/27, 63 y.o.   MRN: [id number]  Patient location: Home My location: Office  Referring Provider: Binnie Rail, MD  I connected with the patient on 10/03/18 at  8:00 AM EDT by a video enabled telemedicine application and verified that I am speaking with the correct person.   I discussed the limitations of evaluation and management by telemedicine and the availability of in person appointments. The patient expressed understanding and agreed to proceed.   Details of the encounter are shown below.  HPI: Luke West is a 63 y.o.-year-old male, returning for follow-up for DM2, dx in 2003, but diagnosed as DM1 in 08/2017, insulin-dependent since 08/2017, uncontrolled, with complications (CKD). Last visit 4 months ago.  He had a period of 1 mo with high CBGs in last 3 mo >> increased Tresiba dose. Since then, his sugars improved >> he was able to decrease the Tresiba dose.  Reviewed latest HbA1c levels: Lab Results  Component Value Date   HGBA1C 6.9 (H) 04/19/2018   HGBA1C 8.1 08/31/2017   HGBA1C 6.5 05/03/2017  04/06/2016: HbA1c calculated from fructosamine: 6.08%  Pt is on a regimen of: - Invokana 100 mg in am. - Onglyza 5 mg in am - Glipizide 5 mg in am, before b'fast - Tresiba 14 >> 12 >> 14 units in am Previously on [place] and Kombiglyze. He could not tolerate metformin ER so we stopped in 02/2018.  He checks his sugars more than 4 times a day with his libre CGM: ave 153 for 30 days, 135 for 15 days - am: 100-150 >> 180-210 >> 90-115 >> 90s - post exercise:  240->350 >> 130-180 >> 110-140 - before lunch: 210-280 >> 130-160 >> 110-140 - 2h after lunch: 180-260 >> 130-170 >> 150-220 - before dinner: 180-220 >> 110-160 >> 100-150 - 2h after dinner: 210-280 >> 110-160 >> 115-170 - bedtime170-270 >> 100-140 >> 85-140 - nighttime:n/c >> 75-140>> 85-140 Lowest sugar was 41 before dinner >> 140 >> 50s >> 70 >> 65; he has hypoglycemia  awareness in the 70s. Highest sugar was 261 >> >350 >> 200s >> <300.  Glucometer: One Touch Ultra Mini  Pt's meals are: - Breakfast: 3 days a week, yoghurt  - Lunch: sandwich, soup, chilli - Dinner: meat, veggies, strach - Snacks: 1+ - nuts; crackers + PB, chips and salsa Drinks water, unsweet tea  He continues to exercise 5 times a week: Cardio + strength - now indoors  -+ Mild CKD, last BUN/creatinine:  Lab Results  Component Value Date   BUN 18 08/23/2018   CREATININE 1.26 08/23/2018   Lab Results  Component Value Date   GFRNONAA 61 08/23/2018   GFRNONAA 71 03/20/2018   GFRNONAA 72.64 03/05/2010   GFRNONAA 67.46 09/11/2008   GFRNONAA 52 08/21/2007   GFRNONAA 75 08/22/2006   No MAU: Lab Results  Component Value Date   MICRALBCREAT 0.9 08/13/2016   MICRALBCREAT 0.8 07/17/2014   MICRALBCREAT 0.7 08/10/2013   MICRALBCREAT 0.8 03/02/2012   MICRALBCREAT 1.7 10/27/2011   MICRALBCREAT 1.1 07/26/2011   MICRALBCREAT 0.9 03/05/2010   MICRALBCREAT 2.8 10/10/2008   MICRALBCREAT 29.8 03/21/2008   MICRALBCREAT 3.0 08/22/2006   -+ HL; last set of lipids: Lab Results  Component Value Date   CHOL 209 (H) 08/23/2018   HDL 44 08/23/2018   LDLCALC 129 (H) 08/23/2018   LDLDIRECT 118.0 07/13/2017   TRIG 182 (H) 08/23/2018   CHOLHDL 4.8 08/23/2018  He restarted back  on Lipitor in 04/2018. He increased Lipitor to 40 mg daily, then to 80 mg daily in 07/2018. Has a repeat Lipid panel in 12/2018. - last eye exam was by Dr. Kathrin Penner: 04/17/2018: No DR - No numbness and tingling in his feet.  He has a history of Raynaud's phenomenon.  He had a clean cath last fall (02/2018). He sees Dr. Oval Linsey.  He also has HTN - at home 135/90.  ROS: Constitutional: no weight gain/no weight loss, no fatigue, no subjective hyperthermia, no subjective hypothermia Eyes: no blurry vision, no xerophthalmia ENT: no sore throat, no nodules palpated in neck, no dysphagia, no odynophagia, no  hoarseness Cardiovascular: no CP/no SOB/no palpitations/no leg swelling Respiratory: no cough/no SOB/no wheezing Gastrointestinal: no N/no V/no D/no C/no acid reflux Musculoskeletal: no muscle aches/no joint aches Skin: no rashes, no hair loss Neurological: no tremors/no numbness/no tingling/no dizziness  I reviewed pt's medications, allergies, PMH, social hx, family hx, and changes were documented in the history of present illness. Otherwise, unchanged from my initial visit note.  Past Medical History:  Diagnosis Date  . DM (diabetes mellitus) ([place])   . [name] syndrome 2008   Total bilirubin 1.4  . Hyperlipemia   . Raynaud phenomenon    Past Surgical History:  Procedure Laterality Date  . COLONOSCOPY  2005 & 2010   negative X 2; Dr Watt Climes (due 2020)  . LEFT HEART CATH AND CORONARY ANGIOGRAPHY N/A 03/21/2018   Procedure: LEFT HEART CATH AND CORONARY ANGIOGRAPHY;  Surgeon: Martinique, Peter M, MD;  Location: [place] [facility];  Service: Cardiovascular;  Laterality: N/A;  . SHOULDER SURGERY     post dislocation sequellae  . TONSILLECTOMY AND ADENOIDECTOMY     History   Social History  . Marital Status: Married    Spouse Name: N/A  . Number of Children: 3   Occupational History  . Real estate development/construction   Social History Main Topics  . Smoking status: Former Smoker    Quit date: 06/28/1990  . Smokeless tobacco: Not on file     Comment: OVF:IEPPI 3-4 year . Smoked [phone] , up to 1-2 ppd  . Alcohol Use: 8.4 oz/week    14 Glasses of wine per week     Comment:  socially  . Drug Use: No   Current Outpatient Medications on File Prior to Visit  Medication Sig Dispense Refill  . aspirin 81 MG tablet Take 81 mg by mouth daily.     Marland Kitchen atorvastatin (LIPITOR) 80 MG tablet Take 1 tablet (80 mg total) by mouth daily. 90 tablet 3  . canagliflozin (INVOKANA) 100 MG TABS tablet Take 1 tablet (100 mg total) by mouth daily before breakfast. 90 tablet 1  . Continuous Blood  Gluc Sensor (FREESTYLE LIBRE 14 DAY SENSOR) MISC APPLY SENSOR AS DIRECTED AND REPLACE EVERY 14 DAYS 2 each 11  . glipiZIDE (GLUCOTROL) 5 MG tablet Take 1 tablet (5 mg total) by mouth daily before breakfast. 90 tablet 2  . Insulin Degludec (TRESIBA) 100 UNIT/ML SOLN Inject 12 Units into the skin daily.     . Insulin Pen Needle (BD PEN NEEDLE NANO U/F) 32G X 4 MM MISC USE ONE TIME A DAY 100 each 3  . Multiple Vitamin (MULTIVITAMIN) tablet Take 1 tablet by mouth daily.    . ONGLYZA 5 MG TABS tablet TAKE 1 TABLET BY MOUTH  DAILY 90 tablet 0  . TRESIBA FLEXTOUCH 100 UNIT/ML SOPN FlexTouch Pen INJECT 12 UNITS INTO THE SKIN ONCE DAILY AT 10PM  15 mL 1   No current facility-administered medications on file prior to visit.    No Known Allergies Family History  Problem Relation Age of Onset  . Aneurysm Mother        cns  . Hyperlipidemia Mother   . Lung cancer Father        smoker  . Aneurysm Father        AAA  . Heart attack Sister 57  . Diabetes Brother        type 1  . Lung cancer Paternal Uncle        smoker  . Aneurysm Maternal Grandmother        cns;PRE 39 (mid 20s)  . Diabetes Maternal Grandfather        type 1  . Lung cancer Paternal Grandfather        smoker  . Aneurysm Paternal Grandmother        AAA   PE: There were no vitals taken for this visit. There is no height or weight on file to calculate BMI. Wt Readings from Last 3 Encounters:  07/27/18 201 lb 6.4 oz (91.4 kg)  06/20/18 204 lb 3.2 oz (92.6 kg)  06/01/18 205 lb (93 kg)   Constitutional:  in NAD  The physical exam was not performed (virtual visit).   ASSESSMENT: 1. DM1, uncontrolled, with complications - CKD  We diagnosed insulin deficiency, consistent with a diagnosis of type 1 diabetes.  No detectable autoimmunity. Component     Latest Ref Rng & Units 09/02/2017  ZNT8 Antibodies     U/mL <15  Glutamic Acid Decarb Ab     <5 IU/mL <5  C-Peptide     0.80 - 3.85 ng/mL 0.51 (L)  Glucose, Plasma     65 -  99 mg/dL 141 (H)  Islet Cell Ab     Neg:<1:1 Negative   2. HL  3.  Overweight  PLAN:  1. Patient with longstanding diabetes, diagnosed as type I a year ago.  We added basal insulin then.  His sugars improved significantly afterwards.  At last visit, HbA1c was also better, at 6.9%. -At last visit, we increased his basal insulin slightly for the holidays.  We continued his SGLT 2 inhibitor, DPP 4 inhibitor, and sulfonylurea.  We discussed about the potential stop of his glipizide at this visit. -He continues to use a freestyle libre CGM and we reviewed the traces together.  At this visit, sugars are improved compared to a month before, when he had a period with high blood sugars and he had to increase his Tresiba dose up to 16 units.  He is now back to 14 units and sugars continue to improve.  However, he has a hyperglycemic spike after lunch and we discussed about trying to figure out which foods can cause this.  He will start writing down what he eats for lunch.  We also discussed about moving glipizide from morning to before lunch.  Ultimately, I advised him that he will probably need mealtime insulin and we can start with only lunchtime and advance to 3 times a day, if needed.  However, I would like to avoid this for now but I asked him to let me know if his sugars remain high after lunch and in that case, we can start lunchtime Humalog/NovoLog. - I advised him to: Patient Instructions  Please continue: - Invokana 100 mg in am. - Onglyza 5 mg in am, but move this before lunch - Glipizide 5 mg  in am, before b'fast - Tresiba 14 units daily  Please come back for a follow-up appointment in 4 months.  -We will check his HbA1c when he returns to the clinic - continue checking sugars at different times of the day - check 4x a day, rotating checks - advised for yearly eye exams >> he is UTD - Return to clinic in 4 mo with sugar log     2. HL - Reviewed latest lipid panel from 07/2018: LDL  higher, above target, TG slightly high Lab Results  Component Value Date   CHOL 209 (H) 08/23/2018   HDL 44 08/23/2018   LDLCALC 129 (H) 08/23/2018   LDLDIRECT 118.0 07/13/2017   TRIG 182 (H) 08/23/2018   CHOLHDL 4.8 08/23/2018  - Continues Lipitor without side effects. Dose was recently increased - he has a repeat lipid panel planned by cardiology in 12/2018.  We discussed that ezetimibe might be added then, and, if not enough, he may need a PCSK9 inhibitor.  3.  Overweight -His BMI is in the overweight range -Continues to exercise, but less now that gyms are closed with the coronavirus pandemic -We will continue Invokana which should also help with weight loss -In the future, we may need to change Onglyza to a GLP-1 receptor agonist, which can also help with weight loss.   Philemon Kingdom, MD PhD [facility]

## 2018-11-28 ENCOUNTER — Encounter: Payer: Self-pay | Admitting: Internal Medicine

## 2018-11-28 ENCOUNTER — Other Ambulatory Visit: Payer: Self-pay | Admitting: Internal Medicine

## 2018-11-28 MED ORDER — INSULIN GLARGINE (2 UNIT DIAL) 300 UNIT/ML ~~LOC~~ SOPN: 14.0000 [IU] | PEN_INJECTOR | Freq: Every day | SUBCUTANEOUS | 3 refills | Status: DC

## 2018-12-12 ENCOUNTER — Other Ambulatory Visit: Payer: 59

## 2018-12-12 LAB — COMPREHENSIVE METABOLIC PANEL
ALT: 19 IU/L (ref 0–44)
AST: 27 IU/L (ref 0–40)
Albumin/Globulin Ratio: 2.4 — ABNORMAL HIGH (ref 1.2–2.2)
Albumin: 4.6 g/dL (ref 3.8–4.8)
Alkaline Phosphatase: 77 IU/L (ref 39–117)
BUN/Creatinine Ratio: 13 (ref 10–24)
BUN: 14 mg/dL (ref 8–27)
Bilirubin Total: 0.5 mg/dL (ref 0.0–1.2)
CO2: 24 mmol/L (ref 20–29)
Calcium: 9.7 mg/dL (ref 8.6–10.2)
Chloride: 100 mmol/L (ref 96–106)
Creatinine, Ser: 1.1 mg/dL (ref 0.76–1.27)
GFR calc Af Amer: 83 mL/min/{1.73_m2} (ref >59)
GFR calc non Af Amer: 72 mL/min/{1.73_m2} (ref >59)
Globulin, Total: 1.9 g/dL (ref 1.5–4.5)
Glucose: 188 mg/dL — ABNORMAL HIGH (ref 65–99)
Potassium: 4.6 mmol/L (ref 3.5–5.2)
Sodium: 137 mmol/L (ref 134–144)
Total Protein: 6.5 g/dL (ref 6.0–8.5)

## 2018-12-13 LAB — LIPID PANEL
Chol/HDL Ratio: 3.4 ratio (ref 0.0–5.0)
Cholesterol, Total: 169 mg/dL (ref 100–199)
HDL: 49 mg/dL (ref >39)
LDL Calculated: 102 mg/dL — ABNORMAL HIGH (ref 0–99)
Triglycerides: 89 mg/dL (ref 0–149)
VLDL Cholesterol Cal: 18 mg/dL (ref 5–40)

## 2018-12-20 ENCOUNTER — Encounter: Payer: Self-pay | Admitting: Internal Medicine

## 2018-12-22 ENCOUNTER — Other Ambulatory Visit: Payer: Self-pay

## 2018-12-22 MED ORDER — TRESIBA FLEXTOUCH 100 UNIT/ML ~~LOC~~ SOPN: PEN_INJECTOR | SUBCUTANEOUS | 0 refills | Status: DC

## 2018-12-27 ENCOUNTER — Telehealth: Payer: Self-pay | Admitting: *Deleted

## 2018-12-27 NOTE — Telephone Encounter (Signed)
Left message to call back to get lipid clinic appointment scheduled

## 2018-12-27 NOTE — Telephone Encounter (Signed)
Patient scheduled 01/16/19

## 2018-12-27 NOTE — Telephone Encounter (Signed)
Advised patient of lab results and message sent to Pharm D for lipid clinic appointment

## 2018-12-27 NOTE — Telephone Encounter (Signed)
Left message to call back  

## 2018-12-27 NOTE — Telephone Encounter (Signed)
-----   Message from Elouise Munroe, MD sent at 12/25/2018  6:27 PM EDT ----- LDL improved but not at goal of <70. Please set up an appt with lipid clinic pharmacists for discussion of additional therapy.

## 2019-01-16 ENCOUNTER — Ambulatory Visit: Payer: 59

## 2019-02-06 ENCOUNTER — Ambulatory Visit (INDEPENDENT_AMBULATORY_CARE_PROVIDER_SITE_OTHER): Payer: 59 | Admitting: Pharmacist Clinician (PhC)/ Clinical Pharmacy Specialist

## 2019-02-06 ENCOUNTER — Encounter: Payer: Self-pay | Admitting: Pharmacist Clinician (PhC)/ Clinical Pharmacy Specialist

## 2019-02-06 ENCOUNTER — Other Ambulatory Visit: Payer: Self-pay

## 2019-02-06 DIAGNOSIS — E782 Mixed hyperlipidemia: Secondary | ICD-10-CM | POA: Diagnosis not present

## 2019-02-06 NOTE — Progress Notes (Signed)
02/06/2019 Luke West 09/02/1955 [id number]   HPI:  Luke West is a 63 y.o. male patient of Dr Oval Linsey, who presents today for a lipid clinic evaluation.  In addition to familial hyperlipidemia, his medical history is significant for DM2 (A1c 6.9), psoriasis, gout, BPH and a strong family history of CAD (see below).  Patient also had an episode of rhabdomyolysis last fall, it was believed to have been caused by difficult workouts with a personal trainer. CK was as high as 2184.   He was on atorvastatin 80 mg at that time and it was discontinued.  He was later re-challenged with low dose and did fine.   Eventually worked back up to 80 mg daily.  He notes occasional aches in his legs, but nothing serious or ongoing.     Current Medications: atorvastatin 80 mg  Cholesterol Goals:  LDL < 70 (due to strong family history)  Family history: both parents and brother died from MI (father first at 71, died at 25; mother at 77, brother at 57)/  Sister also has history of MI in her early 82's.   Both grandmothers had aneurysms (maternal - brain; paternal - aortic).   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 Stays active other days  Labs:   11/2018:  TC 169, TG 89, HDL 49, LDL 102 (atorvastatin 80 mg)  02/2018:  TC 297, TG 167, HDL 53.1, LDL 210  03/19/2019: CK 2184 (scanned document from ED at [facility])   Current Outpatient Medications  Medication Sig Dispense Refill  . aspirin 81 MG tablet Take 81 mg by mouth daily.     Marland Kitchen atorvastatin (LIPITOR) 80 MG tablet Take 1 tablet (80 mg total) by mouth daily. 90 tablet 3  . canagliflozin (INVOKANA) 100 MG TABS tablet Take 1 tablet (100 mg total) by mouth daily before breakfast. 90 tablet 3  . Continuous Blood Gluc Sensor (FREESTYLE LIBRE 14 DAY SENSOR)  MISC APPLY SENSOR AS DIRECTED AND REPLACE EVERY 14 DAYS 2 each 11  . glipiZIDE (GLUCOTROL) 5 MG tablet Take 1 tablet (5 mg total) by mouth daily before breakfast. 90 tablet 3  . insulin degludec (TRESIBA FLEXTOUCH) 100 UNIT/ML SOPN FlexTouch Pen INJECT 14-16 UNITS INTO THE SKIN ONCE DAILY AT 10PM 15 mL 0  . Insulin Glargine, 2 Unit Dial, (TOUJEO MAX SOLOSTAR) 300 UNIT/ML SOPN Inject 14-16 Units into the skin at bedtime. 6 pen 3  . Insulin Pen Needle (BD PEN NEEDLE NANO U/F) 32G X 4 MM MISC USE ONE TIME A DAY 100 each 3  . Multiple Vitamin (MULTIVITAMIN) tablet Take 1 tablet by mouth daily.    . saxagliptin HCl (ONGLYZA) 5 MG TABS tablet Take 1 tablet (5 mg total) by mouth daily. 90 tablet 3   No current facility-administered medications for this visit.     No Known Allergies  Past Medical History:  Diagnosis Date  . DM (diabetes mellitus) ([place])   . [name] syndrome 2008   Total bilirubin 1.4  . Hyperlipemia   . Raynaud phenomenon     There were no vitals taken for this visit.   Mixed hyperlipidemia Patient with familial mixed hyperlipidemia, LDL at baseline was 210, down to 102 on atorvastatin 80 mg daily.  Need to have LDL lower due to  strong family history.  Reviewed options for add on to the atorvastatin.  Will start paperwork to get Repatha covered on insurance.  Patient has copay card to allow for $5/month cost.  Will repeat labs after 5-6 doses.         Tommy Medal PharmD CPP [facility]

## 2019-02-06 NOTE — Patient Instructions (Addendum)
We will start the paperwork to get Repatha covered by your insurance company.    Call the phone number on the copay card to activate it.  It should cost $5 per month, once the card is applied.  Continue all other medications.  We will send you a notice in 2-3 months to get your cholesterol levels rechecked.  Suggest to your children check with their physicians to check their cholesterol levels.  Evolocumab injection What is this medicine? EVOLOCUMAB (e voe LOK ue mab) is known as a PCSK9 inhibitor. It is used to lower the level of cholesterol in the blood. It may be used alone or in combination with other cholesterol-lowering drugs. This drug may also be used to reduce the risk of heart attack, stroke, and certain types of heart surgery in patients with heart disease. This medicine may be used for other purposes; ask your health care provider or pharmacist if you have questions. COMMON BRAND NAME(S): Repatha What should I tell my health care provider before I take this medicine? They need to know if you have any of these conditions:  an unusual or allergic reaction to evolocumab, other medicines, foods, dyes, or preservatives  pregnant or trying to get pregnant  breast-feeding How should I use this medicine? This medicine is for injection under the skin. You will be taught how to prepare and give this medicine. Use exactly as directed. Take your medicine at regular intervals. Do not take your medicine more often than directed. It is important that you put your used needles and syringes in a special sharps container. Do not put them in a trash can. If you do not have a sharps container, call your pharmacist or health care provider to get one. Talk to your pediatrician regarding the use of this medicine in children. While this drug may be prescribed for children as young as 13 years for selected conditions, precautions do apply. Overdosage: If you think you have taken too much of this medicine  contact a poison control center or emergency room at once. NOTE: This medicine is only for you. Do not share this medicine with others. What if I miss a dose? If you miss a dose, take it as soon as you can if there are more than 7 days until the next scheduled dose, or skip the missed dose and take the next dose according to your original schedule. Do not take double or extra doses. What may interact with this medicine? Interactions are not expected. This list may not describe all possible interactions. Give your health care provider a list of all the medicines, herbs, non-prescription drugs, or dietary supplements you use. Also tell them if you smoke, drink alcohol, or use illegal drugs. Some items may interact with your medicine. What should I watch for while using this medicine? You may need blood work while you are taking this medicine. What side effects may I notice from receiving this medicine? Side effects that you should report to your doctor or health care professional as soon as possible:  allergic reactions like skin rash, itching or hives, swelling of the face, lips, or tongue  signs and symptoms of high blood sugar such as dizziness; dry mouth; dry skin; fruity breath; nausea; stomach pain; increased hunger or thirst; increased urination  signs and symptoms of infection like fever or chills; cough; sore throat; pain or trouble passing urine Side effects that usually do not require medical attention (report to your doctor or health care professional if they  continue or are bothersome):  diarrhea  nausea  muscle pain  pain, redness, or irritation at site where injected This list may not describe all possible side effects. Call your doctor for medical advice about side effects. You may report side effects to FDA at 1-800-FDA-1088. Where should I keep my medicine? Keep out of the reach of children. You will be instructed on how to store this medicine. Throw away any unused  medicine after the expiration date on the label. NOTE: This sheet is a summary. It may not cover all possible information. If you have questions about this medicine, talk to your doctor, pharmacist, or health care provider.  2020 Elsevier/Gold Standard (2017-01-24 13:31:00)

## 2019-02-06 NOTE — Assessment & Plan Note (Signed)
Patient with familial mixed hyperlipidemia, LDL at baseline was 210, down to 102 on atorvastatin 80 mg daily.  Need to have LDL lower due to strong family history.  Reviewed options for add on to the atorvastatin.  Will start paperwork to get Repatha covered on insurance.  Patient has copay card to allow for $5/month cost.  Will repeat labs after 5-6 doses.

## 2019-02-13 ENCOUNTER — Other Ambulatory Visit: Payer: Self-pay

## 2019-02-14 ENCOUNTER — Telehealth: Payer: Self-pay

## 2019-02-14 MED ORDER — REPATHA SURECLICK 140 MG/ML ~~LOC~~ SOAJ: 140.0000 mg | SUBCUTANEOUS | 6 refills | Status: DC

## 2019-02-14 NOTE — Telephone Encounter (Signed)
Called and lmomed the pt stating that we obtained the approval for the repatha and that the rx was sent to the [facility]. Instructed the pt to call before going to pick it up and find out the price then call us back if it is unaffordable to the so that we can help them apply for a grant or pt assistance

## 2019-02-14 NOTE — Telephone Encounter (Signed)
Pt called back stated that the pharmacy said the cost is $35 and that is manageable but I instructed the pt to apply for the copay card and they said they would

## 2019-02-15 ENCOUNTER — Encounter: Payer: Self-pay | Admitting: Internal Medicine

## 2019-02-15 ENCOUNTER — Other Ambulatory Visit: Payer: Self-pay

## 2019-02-15 ENCOUNTER — Ambulatory Visit: Payer: 59 | Admitting: Internal Medicine

## 2019-02-15 VITALS — BP 130/80 | HR 48 | Ht 70.0 in | Wt 199.0 lb

## 2019-02-15 DIAGNOSIS — E663 Overweight: Secondary | ICD-10-CM

## 2019-02-15 DIAGNOSIS — E1021 Type 1 diabetes mellitus with diabetic nephropathy: Secondary | ICD-10-CM

## 2019-02-15 DIAGNOSIS — E782 Mixed hyperlipidemia: Secondary | ICD-10-CM

## 2019-02-15 LAB — POCT GLYCOSYLATED HEMOGLOBIN (HGB A1C): Hemoglobin A1C: 6.4 % — AB (ref 4.0–5.6)

## 2019-02-15 MED ORDER — JARDIANCE 25 MG PO TABS: 25.0000 mg | ORAL_TABLET | Freq: Every day | ORAL | 3 refills | Status: DC

## 2019-02-15 MED ORDER — TOUJEO MAX SOLOSTAR 300 UNIT/ML ~~LOC~~ SOPN: 14.0000 [IU] | PEN_INJECTOR | Freq: Every day | SUBCUTANEOUS | 3 refills | Status: DC

## 2019-02-15 NOTE — Patient Instructions (Signed)
Please continue: - Onglyza 5 mg in am - Glipizide 5 mg before lunch  Instead of Invokana, use: - Jardiance 25 mg before b'fast  Instead of Tresiba, use: - Toujeo 14-16 units in am  Please come back for a follow-up appointment in 4 months.

## 2019-02-15 NOTE — Progress Notes (Signed)
Patient ID: Luke West, male   DOB: 1956-06-28, 63 y.o.   MRN: [id number]  HPI: Luke West is a 63 y.o.-year-old male, returning for follow-up for DM2, dx in 2003, but diagnosed as DM1 in 08/2017, insulin-dependent since 08/2017, uncontrolled, with complications (CKD). Last visit was 4 months ago.  Reviewed latest HbA1c levels: Lab Results  Component Value Date   HGBA1C 6.9 (H) 04/19/2018   HGBA1C 8.1 08/31/2017   HGBA1C 6.5 05/03/2017  04/06/2016: HbA1c calculated from fructosamine: 6.08%  Pt is on a regimen of: - Invokana 100 mg in am. - Onglyza 5 mg in am - Glipizide 5 mg in am >> before lunch - Tresiba 14 >> 12 >> 14 units in am (will switch to [employer]) Previously on [place]. He could not tolerate metformin ER so we stopped in 02/2018.  He checks his sugars >4x a day with his freestyle libre CGM:  Pt.checks his sugars more than 4 times a day with his CGM.  Freestyle libre CGM parameters: - Average: 133 - % active CGM time: 66% of the time - Glucose variability 30.5 (target < or = to 36%) - time in range:  - very low (<54): 0% - low (54-69): 2% - normal range (70-180): 86% - high sugars (181-250): 11% - very high sugars (>250): 1%   Lowest sugar was 41 before dinner >> ...65 >> 23s (libre); he has hypoglycemia awareness in the 75s. Highest sugar was <300 >> 250s.  Glucometer: One Touch Ultra Mini  Pt's meals are: - Breakfast: 3 days a week, yoghurt  - Lunch: sandwich, soup, chilli - Dinner: meat, veggies, strach - Snacks: 1+ - nuts; crackers + PB, chips and salsa Drinks water, and sweet tea.  He continues to exercise 5 times a week: Cardio with strength exercises.  -+ Mild CKD, last BUN/creatinine:  Lab Results  Component Value Date   BUN 14 12/12/2018   CREATININE 1.10 12/12/2018   Lab Results  Component Value Date   GFRNONAA 72 12/12/2018   GFRNONAA 61 08/23/2018   GFRNONAA 71 03/20/2018   GFRNONAA 72.64 03/05/2010   GFRNONAA  67.46 09/11/2008   GFRNONAA 52 08/21/2007   GFRNONAA 75 08/22/2006   No MAU: Lab Results  Component Value Date   MICRALBCREAT 0.9 08/13/2016   MICRALBCREAT 0.8 07/17/2014   MICRALBCREAT 0.7 08/10/2013   MICRALBCREAT 0.8 03/02/2012   MICRALBCREAT 1.7 10/27/2011   MICRALBCREAT 1.1 07/26/2011   MICRALBCREAT 0.9 03/05/2010   MICRALBCREAT 2.8 10/10/2008   MICRALBCREAT 29.8 03/21/2008   MICRALBCREAT 3.0 08/22/2006   -+ HL; last set of lipids: Lab Results  Component Value Date   CHOL 169 12/12/2018   HDL 49 12/12/2018   LDLCALC 102 (H) 12/12/2018   LDLDIRECT 118.0 07/13/2017   TRIG 89 12/12/2018   CHOLHDL 3.4 12/12/2018  He restarted back on Lipitor in 04/2018. He increased Lipitor to 40 mg daily, then to 80 mg daily in 07/2018.  Since last visit, he also added Repatha - to start today. - last eye exam was by Dr. Kathrin Penner: 03/2018: No DR -No numbness and tingling in his feet.  He has a history of Raynaud's phenomenon.  He had a clean cath last fall (02/2018).  He sees Dr. Oval Linsey  She also has a history of HTN.  ROS: Constitutional: no weight gain/no weight loss, no fatigue, no subjective hyperthermia, no subjective hypothermia Eyes: no blurry vision, no xerophthalmia ENT: no sore throat, no nodules palpated in neck, no dysphagia, no odynophagia,  no hoarseness Cardiovascular: no CP/no SOB/no palpitations/no leg swelling Respiratory: no cough/no SOB/no wheezing Gastrointestinal: no N/no V/no D/no C/no acid reflux Musculoskeletal: no muscle aches/no joint aches Skin: no rashes, no hair loss Neurological: no tremors/no numbness/no tingling/no dizziness  I reviewed pt's medications, allergies, PMH, social hx, family hx, and changes were documented in the history of present illness. Otherwise, unchanged from my initial visit note.  Past Medical History:  Diagnosis Date  . DM (diabetes mellitus) ([place])   . [name] syndrome 2008   Total bilirubin 1.4  . Hyperlipemia   .  Raynaud phenomenon    Past Surgical History:  Procedure Laterality Date  . COLONOSCOPY  2005 & 2010   negative X 2; Dr Watt Climes (due 2020)  . LEFT HEART CATH AND CORONARY ANGIOGRAPHY N/A 03/21/2018   Procedure: LEFT HEART CATH AND CORONARY ANGIOGRAPHY;  Surgeon: Martinique, Peter M, MD;  Location: [facility];  Service: Cardiovascular;  Laterality: N/A;  . SHOULDER SURGERY     post dislocation sequellae  . TONSILLECTOMY AND ADENOIDECTOMY     History   Social History  . Marital Status: Married    Spouse Name: N/A  . Number of Children: 3   Occupational History  . Real estate development/construction   Social History Main Topics  . Smoking status: Former Smoker    Quit date: 06/28/1990  . Smokeless tobacco: Not on file     Comment: [id number] 3-4 year . Smoked [phone] , up to 1-2 ppd  . Alcohol Use: 8.4 oz/week    14 Glasses of wine per week     Comment:  socially  . Drug Use: No   Current Outpatient Medications on File Prior to Visit  Medication Sig Dispense Refill  . aspirin 81 MG tablet Take 81 mg by mouth daily.     Marland Kitchen atorvastatin (LIPITOR) 80 MG tablet Take 1 tablet (80 mg total) by mouth daily. 90 tablet 3  . canagliflozin (INVOKANA) 100 MG TABS tablet Take 1 tablet (100 mg total) by mouth daily before breakfast. 90 tablet 3  . Continuous Blood Gluc Sensor (FREESTYLE LIBRE 14 DAY SENSOR) MISC APPLY SENSOR AS DIRECTED AND REPLACE EVERY 14 DAYS 2 each 11  . Evolocumab (REPATHA SURECLICK) XX123456 MG/ML SOAJ Inject 140 mg into the skin every 14 (fourteen) days. 2 pen 6  . glipiZIDE (GLUCOTROL) 5 MG tablet Take 1 tablet (5 mg total) by mouth daily before breakfast. 90 tablet 3  . insulin degludec (TRESIBA FLEXTOUCH) 100 UNIT/ML SOPN FlexTouch Pen INJECT 14-16 UNITS INTO THE SKIN ONCE DAILY AT 10PM 15 mL 0  . Insulin Glargine, 2 Unit Dial, (TOUJEO MAX SOLOSTAR) 300 UNIT/ML SOPN Inject 14-16 Units into the skin at bedtime. 6 pen 3  . Insulin Pen Needle (BD PEN NEEDLE NANO U/F) 32G  X 4 MM MISC USE ONE TIME A DAY 100 each 3  . Multiple Vitamin (MULTIVITAMIN) tablet Take 1 tablet by mouth daily.    . saxagliptin HCl (ONGLYZA) 5 MG TABS tablet Take 1 tablet (5 mg total) by mouth daily. 90 tablet 3   No current facility-administered medications on file prior to visit.    No Known Allergies Family History  Problem Relation Age of Onset  . Aneurysm Mother        cns  . Hyperlipidemia Mother   . Lung cancer Father        smoker  . Aneurysm Father        AAA  . Heart attack Sister 86  .  Diabetes Brother        type 1  . Lung cancer Paternal Uncle        smoker  . Aneurysm Maternal Grandmother        cns;PRE 65 (mid 27s)  . Diabetes Maternal Grandfather        type 1  . Lung cancer Paternal Grandfather        smoker  . Aneurysm Paternal Grandmother        AAA   PE: BP 130/80   Pulse (!) 48   Ht 5\' 10"  (1.778 m)   Wt 199 lb (90.3 kg)   SpO2 99%   BMI 28.55 kg/m  Body mass index is 28.55 kg/m. Wt Readings from Last 3 Encounters:  02/15/19 199 lb (90.3 kg)  07/27/18 201 lb 6.4 oz (91.4 kg)  06/20/18 204 lb 3.2 oz (92.6 kg)   Constitutional: overweight, in NAD Eyes: PERRLA, EOMI, no exophthalmos ENT: moist mucous membranes, no thyromegaly, no cervical lymphadenopathy Cardiovascular: RRR, No MRG Respiratory: CTA B Gastrointestinal: abdomen soft, NT, ND, BS+ Musculoskeletal: no deformities, strength intact in all 4 Skin: moist, warm, no rashes Neurological: no tremor with outstretched hands, DTR normal in all 4   ASSESSMENT: 1. DM1, uncontrolled, with complications - CKD  We diagnosed insulin deficiency, consistent with a diagnosis of type 1 diabetes.  No detectable autoimmunity: Component     Latest Ref Rng & Units 09/02/2017  ZNT8 Antibodies     U/mL <15  Glutamic Acid Decarb Ab     <5 IU/mL <5  C-Peptide     0.80 - 3.85 ng/mL 0.51 (L)  Glucose, Plasma     65 - 99 mg/dL 141 (H)  Islet Cell Ab     Neg:<1:1 Negative   2. HL  3.   Overweight  PLAN:  1. Patient with longstanding diabetes, diagnosed as type I recently.  We added basal insulin then.  His sugars improved significantly afterwards.  At last in person visit, his HbA1c was 6.9%.  We had another, virtual, visit 4 months ago.  At that time, sugars were controlled so we did not have to change his regimen.  I only advised him to move Onglyza before lunch, rather than before breakfast since he was having a hypoglycemic spike after lunch.  we also discussed about trying to figure out which foods can cause this. -We again discussed today that he may need mealtime insulin in the near future, at least with the most problematic meal initially. - at this visit, sugars are a little better, but he still has a post-lunch hyperglycemic spike.  He tells me that he misunderstood instructions and moved glipizide before lunch, rather than Onglyza.  We will continue this.  Sugars after lunch have been improving and he also noticed that certain foods will spike his sugars more.  He is trying to reduce these. -I do not absolutely feel that we need to change his regimen for now, but per insurance preference, we need to change from [place] to [place] and from [place] to [place].  I advised him that after switching to [facility] he may need to increase the dose slightly. - I advised him to: Patient Instructions  Please continue: - Invokana 100 mg in am. - Onglyza 5 mg before b'fast - Glipizide 5 mg before lunch - Tresiba 14 units daily  Please come back for a follow-up appointment in 4 months.  - we checked his HbA1c: 6.4% (better) - advised to check sugars at different  times of the day - 4x a day, rotating check times - advised for yearly eye exams >> he is UTD - return to clinic in 4 months     2. HL - Reviewed latest lipid panel from 2 months ago: LDL only slightly above goal, improved from last year, the rest of the lipid fractions are excellent: Lab Results  Component Value Date    CHOL 169 12/12/2018   HDL 49 12/12/2018   LDLCALC 102 (H) 12/12/2018   LDLDIRECT 118.0 07/13/2017   TRIG 89 12/12/2018   CHOLHDL 3.4 12/12/2018  - Continues  Lipitor without side effects.  He will start Repatha, also.  3.  Overweight -His BMI is in the overweight range, but he lost 2 more lbs since last OV -He continues to exercise but not as much since the coronavirus pandemic as she was closed -Continue Invokana which should also help with weight loss -In the future, we may need to change Onglyza to a GLP-1 receptor agonist, which can also help with weight loss   Luke Kingdom, MD PhD [facility] Endocrinology

## 2019-02-28 ENCOUNTER — Encounter: Payer: Self-pay | Admitting: Internal Medicine

## 2019-03-08 ENCOUNTER — Other Ambulatory Visit: Payer: Self-pay

## 2019-03-08 ENCOUNTER — Ambulatory Visit: Payer: 59 | Admitting: Cardiovascular Disease

## 2019-03-08 ENCOUNTER — Encounter: Payer: Self-pay | Admitting: Cardiovascular Disease

## 2019-03-08 VITALS — BP 118/80 | HR 49 | Temp 97.9°F | Ht 70.0 in | Wt 199.6 lb

## 2019-03-08 DIAGNOSIS — E119 Type 2 diabetes mellitus without complications: Secondary | ICD-10-CM | POA: Diagnosis not present

## 2019-03-08 DIAGNOSIS — E7801 Familial hypercholesterolemia: Secondary | ICD-10-CM | POA: Diagnosis not present

## 2019-03-08 NOTE — Progress Notes (Signed)
Cardiology Office Note   Date:  03/08/2019   ID:  Luke West, DOB 07-May-1956, MRN [id number]  PCP:  Binnie Rail, MD Stacu Burns Cardiologist:   Skeet Latch, MD   No chief complaint on file.    History of Present Illness: Luke West is a 63 y.o. male with diabetes mellitus type 2 and familial hyperlipidemia here for follow up.  He was initially seen 06/2015 to establish care.  [name] has a family history of premature coronary artery disease.  His brother and sister both had heart attacks in their 33s.  His father had a AAA.  He was previously a patient of Dr. Mar Daring and underwent stress testing 10 years ago that was negative for ischemia.  At his initial appointment he was exercising regularly and had no symptoms.  He was already on aspirin and a statin.  We discussed the pros and cons of coronary calcium scoring and elected to continue with medical management, diet, and exercise.  In July 2019 he had an episode of near syncope.  He had a heavy workout with a personal trainer and felt nearly exhausted afterwards.  He did a lot of leg exercises.  The following day he went to play golf and felt very tired and short of breath.  He had difficulty walking up the hills and felt dizzy when he got to the top of the hill.  He has several physician friends who urged him to get an EKG performed.  When he was unable to do so he went to his local emergency department where EKG was unchanged from prior.  However, he was found to have rhabdomyolysis with CK greater than 2200.  He was given IV fluids and his statin was held.  He was subsequently seen in our office and underwent left heart catheterization 02/2018 that revealed normal coronaries.  At his last appointment his blood pressure was poorly controlled.  He was asked to track it at home.  Atorvastatin was also increased.  He followed up with [name], PA-C on 06/2018.  At home his blood pressure was running in the 150s, though it  was found that his blood pressure machine was running about 20 points higher than the office machine.  He was encouraged to continue working on diet and exercise.  He had follow-up lipids that showed an improvement in his LDL from 129 to 102.  He was referred to the lipid clinic and started on Repatha 01/2019.  Overall he has been doing well.  He noticed that prior to taking Repatha his blood glucose was averaging in the 130s.  Since starting Repatha his glucose has increased to the 180s and even up to 300.   The numbers started coming down prior to his next scheduled dose.  He has been working out 3-4 days per week which is down from 5-6 days.  He has been exercising less due to coronavirus.  He has no exertional chest pain or shortness of breath.  He also denies palpitations, lightheadedness, or dizziness.  He has no lower extremity edema, orthopnea, or PND.   Past Medical History:  Diagnosis Date  . DM (diabetes mellitus) ([place])   . [name] syndrome 2008   Total bilirubin 1.4  . Hyperlipemia   . Raynaud phenomenon     Past Surgical History:  Procedure Laterality Date  . COLONOSCOPY  2005 & 2010   negative X 2; Dr Watt Climes (due 2020)  . LEFT HEART CATH AND  CORONARY ANGIOGRAPHY N/A 03/21/2018   Procedure: LEFT HEART CATH AND CORONARY ANGIOGRAPHY;  Surgeon: Martinique, Peter M, MD;  Location: [facility];  Service: Cardiovascular;  Laterality: N/A;  . SHOULDER SURGERY     post dislocation sequellae  . TONSILLECTOMY AND ADENOIDECTOMY       Current Outpatient Medications  Medication Sig Dispense Refill  . aspirin 81 MG tablet Take 81 mg by mouth daily.     . Continuous Blood Gluc Sensor (FREESTYLE LIBRE 14 DAY SENSOR) MISC APPLY SENSOR AS DIRECTED AND REPLACE EVERY 14 DAYS 2 each 11  . empagliflozin (JARDIANCE) 25 MG TABS tablet Take 25 mg by mouth daily before breakfast. 90 tablet 3  . Evolocumab (REPATHA SURECLICK) XX123456 MG/ML SOAJ Inject 140 mg into the skin every 14 (fourteen) days. 2 pen 6   . glipiZIDE (GLUCOTROL) 5 MG tablet Take 1 tablet (5 mg total) by mouth daily before breakfast. 90 tablet 3  . Insulin Glargine, 2 Unit Dial, (TOUJEO MAX SOLOSTAR) 300 UNIT/ML SOPN Inject 14-16 Units into the skin daily before breakfast. 6 pen 3  . Insulin Pen Needle (BD PEN NEEDLE NANO U/F) 32G X 4 MM MISC USE ONE TIME A DAY 100 each 3  . Multiple Vitamin (MULTIVITAMIN) tablet Take 1 tablet by mouth daily.    . saxagliptin HCl (ONGLYZA) 5 MG TABS tablet Take 1 tablet (5 mg total) by mouth daily. 90 tablet 3  . atorvastatin (LIPITOR) 80 MG tablet Take 1 tablet (80 mg total) by mouth daily. 90 tablet 3   No current facility-administered medications for this visit.     Allergies:   Patient has no known allergies.    Social History:  The patient  reports that he quit smoking about 28 years ago. He has never used smokeless tobacco. He reports current alcohol use of about 14.0 standard drinks of alcohol per week. He reports that he does not use drugs.   Family History:  The patient's family history includes Aneurysm in his father, maternal grandmother, mother, and paternal grandmother; Diabetes in his brother and maternal grandfather; Heart attack (age of onset: 47) in his sister; Hyperlipidemia in his mother; Lung cancer in his father, paternal grandfather, and paternal uncle.    ROS:  Please see the history of present illness.   Otherwise, review of systems are positive for none.   All other systems are reviewed and negative.    PHYSICAL EXAM: VS:  BP 118/80   Pulse (!) 49   Temp 97.9 F (36.6 C) (Temporal)   Ht 5\' 10"  (1.778 m)   Wt 199 lb 9.6 oz (90.5 kg)   SpO2 95%   BMI 28.64 kg/m  , BMI Body mass index is 28.64 kg/m. GENERAL:  Well appearing HEENT: Pupils equal round and reactive, fundi not visualized, oral mucosa unremarkable NECK:  No jugular venous distention, waveform within normal limits, carotid upstroke brisk and symmetric, no bruits LUNGS:  Clear to auscultation  bilaterally HEART:  RRR.  PMI not displaced or sustained,S1 and S2 within normal limits, no S3, no S4, no clicks, no rubs, no murmurs ABD:  Flat, positive bowel sounds normal in frequency in pitch, no bruits, no rebound, no guarding, no midline pulsatile mass, no hepatomegaly, no splenomegaly EXT:  2 plus pulses throughout, no edema, no cyanosis no clubbing SKIN:  No rashes no nodules NEURO:  Cranial nerves II through XII grossly intact, motor grossly intact throughout PSYCH:  Cognitively intact, oriented to person place and time  EKG:  EKG is ordered today. The ekg ordered 07/10/15 demonstrates sinus bradycardia.  Rate 47 bpm.  LVH with repolarization abnormality.  06/20/18: Sinus bradycardia.  Sinus arrhythmia.  Rate 46 bpm.  First degree AV block.  LVH with repolarization abnormality.  03/08/19: Sinus bradycardia.  Rate 49 beats per minute.  First-degree AV block.  Early repolarization abnormality.   Recent Labs: 03/20/2018: Hemoglobin 15.6; Platelets 178 12/12/2018: ALT 19; BUN 14; Creatinine, Ser 1.10; Potassium 4.6; Sodium 137    Lipid Panel    Component Value Date/Time   CHOL 169 12/12/2018 0841   TRIG 89 12/12/2018 0841   HDL 49 12/12/2018 0841   CHOLHDL 3.4 12/12/2018 0841   CHOLHDL 4 05/22/2018 0757   VLDL 31.6 05/22/2018 0757   LDLCALC 102 (H) 12/12/2018 0841   LDLDIRECT 118.0 07/13/2017 0846      Wt Readings from Last 3 Encounters:  03/08/19 199 lb 9.6 oz (90.5 kg)  02/15/19 199 lb (90.3 kg)  07/27/18 201 lb 6.4 oz (91.4 kg)      ASSESSMENT AND PLAN:  # Cardiovascular risk:   # Familial hyperlipidemia: Mr. Scherman had a cardiac cath 02/2018 that showed normal coronaries.  Given his DM, hyperlipidemia and family history, continue primary prevention with aspirin and atorvastatin.  We will increase atorvastatin to 40mg .  Repeat lipids/CMP in 2 months.  Goal LDL is <70.  There is some concern that Repatha may be causing hyperglycemia.  Although this is not a known side  effect, it certainly may not be a medication that works for him.  He is going to continue watching his blood sugars this cycle.  If they remain elevated we will consider switching to Praluent.  Zetia would also be another option.  Continue atorvastatin.  He will continue to follow-up with the lipid clinic as scheduled.  # Elevated blood glucose: BP is well-controlled off medications.  Continue diet and exercise.   Current medicines are reviewed at length with the patient today.  The patient does not have concerns regarding medicines.  The following changes have been made:  no change  Labs/ tests ordered today include:   No orders of the defined types were placed in this encounter.    Disposition:   FU with Clorinda Wyble C. Oval Linsey, MD, [facility] in 1 year.     Signed, Ranny Wiebelhaus C. Oval Linsey, MD, [facility]  03/08/2019 8:24 AM    [place]

## 2019-03-08 NOTE — Patient Instructions (Signed)
Medication Instructions:  Your physician recommends that you continue on your current medications as directed. Please refer to the Current Medication list given to you today.  If you need a refill on your cardiac medications before your next appointment, please call your pharmacy.   Lab work: NONE  Testing/Procedures: NONE   Follow-Up: At [facility], you and your health needs are our priority.  As part of our continuing mission to provide you with exceptional heart care, we have created designated Provider Care Teams.  These Care Teams include your primary Cardiologist (physician) and Advanced Practice Providers (APPs -  Physician Assistants and Nurse Practitioners) who all work together to provide you with the care you need, when you need it. You will need a follow up appointment in 12 months.  Please call our office 2 months in advance to schedule this appointment.  You may see DR Eden  or one of the following Advanced Practice Providers on your designated Care Team:   Luke Kilroy, PA-C Krista Kroeger, PA-C . Callie Goodrich, PA-C    

## 2019-04-19 LAB — HM DIABETES EYE EXAM

## 2019-04-26 ENCOUNTER — Encounter: Payer: Self-pay | Admitting: Internal Medicine

## 2019-04-30 NOTE — Patient Instructions (Addendum)
Tests ordered today. Your results will be released to [place] (or called to you) after review.  If any changes need to be made, you will be notified at that same time.  All other Health Maintenance issues reviewed.   All recommended immunizations and age-appropriate screenings are up-to-date or discussed.  MMR and tdap  immunizations administered today.   Medications reviewed and updated.  Changes include :   none    Please followup in 1 year    Health Maintenance, Male Adopting a healthy lifestyle and getting preventive care are important in promoting health and wellness. Ask your health care provider about:  The right schedule for you to have regular tests and exams.  Things you can do on your own to prevent diseases and keep yourself healthy. What should I know about diet, weight, and exercise? Eat a healthy diet   Eat a diet that includes plenty of vegetables, fruits, low-fat dairy products, and lean protein.  Do not eat a lot of foods that are high in solid fats, added sugars, or sodium. Maintain a healthy weight Body mass index (BMI) is a measurement that can be used to identify possible weight problems. It estimates body fat based on height and weight. Your health care provider can help determine your BMI and help you achieve or maintain a healthy weight. Get regular exercise Get regular exercise. This is one of the most important things you can do for your health. Most adults should:  Exercise for at least 150 minutes each week. The exercise should increase your heart rate and make you sweat (moderate-intensity exercise).  Do strengthening exercises at least twice a week. This is in addition to the moderate-intensity exercise.  Spend less time sitting. Even light physical activity can be beneficial. Watch cholesterol and blood lipids Have your blood tested for lipids and cholesterol at 63 years of age, then have this test every 5 years. You may need to have your  cholesterol levels checked more often if:  Your lipid or cholesterol levels are high.  You are older than 63 years of age.  You are at high risk for heart disease. What should I know about cancer screening? Many types of cancers can be detected early and may often be prevented. Depending on your health history and family history, you may need to have cancer screening at various ages. This may include screening for:  Colorectal cancer.  Prostate cancer.  Skin cancer.  Lung cancer. What should I know about heart disease, diabetes, and high blood pressure? Blood pressure and heart disease  High blood pressure causes heart disease and increases the risk of stroke. This is more likely to develop in people who have high blood pressure readings, are of African descent, or are overweight.  Talk with your health care provider about your target blood pressure readings.  Have your blood pressure checked: ? Every 3-5 years if you are 22-6 years of age. ? Every year if you are 79 years old or older.  If you are between the ages of 14 and 68 and are a current or former smoker, ask your health care provider if you should have a one-time screening for abdominal aortic aneurysm (AAA). Diabetes Have regular diabetes screenings. This checks your fasting blood sugar level. Have the screening done:  Once every three years after age 1 if you are at a normal weight and have a low risk for diabetes.  More often and at a younger age if you are overweight or  have a high risk for diabetes. What should I know about preventing infection? Hepatitis B If you have a higher risk for hepatitis B, you should be screened for this virus. Talk with your health care provider to find out if you are at risk for hepatitis B infection. Hepatitis C Blood testing is recommended for:  Everyone born from [birth year] through 1965.  Anyone with known risk factors for hepatitis C. Sexually transmitted infections (STIs)  You  should be screened each year for STIs, including gonorrhea and chlamydia, if: ? You are sexually active and are younger than 62 years of age. ? You are older than 63 years of age and your health care provider tells you that you are at risk for this type of infection. ? Your sexual activity has changed since you were last screened, and you are at increased risk for chlamydia or gonorrhea. Ask your health care provider if you are at risk.  Ask your health care provider about whether you are at high risk for HIV. Your health care provider may recommend a prescription medicine to help prevent HIV infection. If you choose to take medicine to prevent HIV, you should first get tested for HIV. You should then be tested every 3 months for as long as you are taking the medicine. Follow these instructions at home: Lifestyle  Do not use any products that contain nicotine or tobacco, such as cigarettes, e-cigarettes, and chewing tobacco. If you need help quitting, ask your health care provider.  Do not use street drugs.  Do not share needles.  Ask your health care provider for help if you need support or information about quitting drugs. Alcohol use  Do not drink alcohol if your health care provider tells you not to drink.  If you drink alcohol: ? Limit how much you have to 0-2 drinks a day. ? Be aware of how much alcohol is in your drink. In the [place] one drink equals one 12 oz bottle of beer (355 mL), one 5 oz glass of wine (148 mL), or one 1 oz glass of hard liquor (44 mL). General instructions  Schedule regular health, dental, and eye exams.  Stay current with your vaccines.  Tell your health care provider if: ? You often feel depressed. ? You have ever been abused or do not feel safe at home. Summary  Adopting a healthy lifestyle and getting preventive care are important in promoting health and wellness.  Follow your health care provider's instructions about healthy diet, exercising, and  getting tested or screened for diseases.  Follow your health care provider's instructions on monitoring your cholesterol and blood pressure. This information is not intended to replace advice given to you by your health care provider. Make sure you discuss any questions you have with your health care provider. Document Released: 12/11/2007 Document Revised: 06/07/2018 Document Reviewed: 06/07/2018 Elsevier Patient Education  2020 [employer].

## 2019-04-30 NOTE — Assessment & Plan Note (Addendum)
Follows with urology Up to date with visits, psa normal

## 2019-04-30 NOTE — Assessment & Plan Note (Addendum)
Management per Dr Cruzita Lederer A1c, urine micro

## 2019-04-30 NOTE — Progress Notes (Signed)
Subjective:    Patient ID: Luke West, male    DOB: July 12, 1955, 63 y.o.   MRN: [id number]  HPI He is here for a physical exam.     Medications and allergies reviewed with patient and updated if appropriate.  Patient Active Problem List   Diagnosis Date Noted  . Normal coronary arteries 07/27/2018  . Chest pain with high risk for cardiac etiology 03/21/2018  . Near syncope 03/21/2018  . Rhabdomyolysis 03/21/2018  . Family history of abdominal aortic aneurysm (AAA) 07/11/2017  . Psoriasis 03/15/2016  . Rosacea 03/15/2016  . Family history of premature CAD 06/12/2015  . Diabetes mellitus type II, non insulin dependent ([place]) 04/15/2015  . Nocturnal leg cramps 12/11/2013  . Benign prostatic hyperplasia 03/05/2010  . Mixed hyperlipidemia 09/21/2007  . History of gout 05/11/2007  . RAYNAUD'S SYNDROME 05/11/2007    Current Outpatient Medications on File Prior to Visit  Medication Sig Dispense Refill  . aspirin 81 MG tablet Take 81 mg by mouth daily.     . Continuous Blood Gluc Sensor (FREESTYLE LIBRE 14 DAY SENSOR) MISC APPLY SENSOR AS DIRECTED AND REPLACE EVERY 14 DAYS 2 each 11  . empagliflozin (JARDIANCE) 25 MG TABS tablet Take 25 mg by mouth daily before breakfast. 90 tablet 3  . Evolocumab (REPATHA SURECLICK) 841 MG/ML SOAJ Inject 140 mg into the skin every 14 (fourteen) days. 2 pen 6  . glipiZIDE (GLUCOTROL) 5 MG tablet Take 1 tablet (5 mg total) by mouth daily before breakfast. 90 tablet 3  . Insulin Glargine, 2 Unit Dial, (TOUJEO MAX SOLOSTAR) 300 UNIT/ML SOPN Inject 14-16 Units into the skin daily before breakfast. 6 pen 3  . Insulin Pen Needle (BD PEN NEEDLE NANO U/F) 32G X 4 MM MISC USE ONE TIME A DAY 100 each 3  . Multiple Vitamin (MULTIVITAMIN) tablet Take 1 tablet by mouth daily.    . saxagliptin HCl (ONGLYZA) 5 MG TABS tablet Take 1 tablet (5 mg total) by mouth daily. 90 tablet 3  . atorvastatin (LIPITOR) 80 MG tablet Take 1 tablet (80 mg total) by mouth daily.  90 tablet 3   No current facility-administered medications on file prior to visit.     Past Medical History:  Diagnosis Date  . DM (diabetes mellitus) ([place])   . [name] syndrome 2008   Total bilirubin 1.4  . Hyperlipemia   . Raynaud phenomenon     Past Surgical History:  Procedure Laterality Date  . COLONOSCOPY  2005 & 2010   negative X 2; Dr Watt Climes (due 2020)  . LEFT HEART CATH AND CORONARY ANGIOGRAPHY N/A 03/21/2018   Procedure: LEFT HEART CATH AND CORONARY ANGIOGRAPHY;  Surgeon: Martinique, Peter M, MD;  Location: [facility];  Service: Cardiovascular;  Laterality: N/A;  . SHOULDER SURGERY     post dislocation sequellae  . TONSILLECTOMY AND ADENOIDECTOMY      Social History   Socioeconomic History  . Marital status: Married    Spouse name: Not on file  . Number of children: Not on file  . Years of education: Not on file  . Highest education level: Not on file  Occupational History  . Not on file  Social Needs  . Financial resource strain: Not on file  . Food insecurity    Worry: Not on file    Inability: Not on file  . Transportation needs    Medical: Not on file    Non-medical: Not on file  Tobacco Use  .  Smoking status: Former Smoker    Quit date: 06/28/1990    Years since quitting: 28.8  . Smokeless tobacco: Never Used  . Tobacco comment: ME[facility] 3-4 year . Smoked [phone] , up to 1-2 ppd  Substance and Sexual Activity  . Alcohol use: Yes    Alcohol/week: 14.0 standard drinks    Types: 14 Glasses of wine per week    Comment:  socially  . Drug use: No  . Sexual activity: Not on file  Lifestyle  . Physical activity    Days per week: Not on file    Minutes per session: Not on file  . Stress: Not on file  Relationships  . Social Herbalist on phone: Not on file    Gets together: Not on file    Attends religious service: Not on file    Active member of club or organization: Not on file    Attends meetings of clubs or organizations: Not  on file    Relationship status: Not on file  Other Topics Concern  . Not on file  Social History Narrative  . Not on file    Family History  Problem Relation Age of Onset  . Aneurysm Mother        cns  . Hyperlipidemia Mother   . Lung cancer Father        smoker  . Aneurysm Father        AAA  . Heart attack Sister 20  . Diabetes Brother        type 1  . Lung cancer Paternal Uncle        smoker  . Aneurysm Maternal Grandmother        cns;PRE 48 (mid 59s)  . Diabetes Maternal Grandfather        type 1  . Lung cancer Paternal Grandfather        smoker  . Aneurysm Paternal Grandmother        AAA    Review of Systems  Constitutional: Negative for chills, fatigue and fever.  Eyes: Negative for visual disturbance.  Respiratory: Negative for cough, shortness of breath and wheezing.   Cardiovascular: Negative for chest pain, palpitations and leg swelling.  Gastrointestinal: Negative for abdominal pain, blood in stool, constipation, diarrhea and nausea.       No gerd  Genitourinary: Negative for dysuria and hematuria.  Musculoskeletal: Negative for arthralgias and back pain.  Skin: Negative for rash.  Neurological: Negative for dizziness, light-headedness, numbness and headaches.  Psychiatric/Behavioral: Negative for dysphoric mood. The patient is not nervous/anxious.        Objective:   Vitals:   05/01/19 0752  BP: 134/78  Pulse: (!) 50  Resp: 16  Temp: 98.3 F (36.8 C)  SpO2: 99%   Filed Weights   05/01/19 0752  Weight: 205 lb (93 kg)   Body mass index is 29.41 kg/m.  BP Readings from Last 3 Encounters:  05/01/19 134/78  03/08/19 118/80  02/15/19 130/80    Wt Readings from Last 3 Encounters:  05/01/19 205 lb (93 kg)  03/08/19 199 lb 9.6 oz (90.5 kg)  02/15/19 199 lb (90.3 kg)     Physical Exam Constitutional: He appears well-developed and well-nourished. No distress.  HENT:  Head: Normocephalic and atraumatic.  Right Ear: External ear normal.   Left Ear: External ear normal.  Mouth/Throat: Oropharynx is clear and moist.  Normal ear canals and TM b/l  Eyes: Conjunctivae and EOM are normal.  Neck: Neck  supple. No tracheal deviation present. No thyromegaly present.  No carotid bruit  Cardiovascular: Normal rate, regular rhythm, normal heart sounds and intact distal pulses.   No murmur heard. Pulmonary/Chest: Effort normal and breath sounds normal. No respiratory distress. He has no wheezes. He has no rales.  Abdominal: Soft. He exhibits no distension. There is no tenderness.  Genitourinary: deferred  Musculoskeletal: He exhibits no edema.  Lymphadenopathy:   He has no cervical adenopathy.  Skin: Skin is warm and dry. He is not diaphoretic.  Psychiatric: He has a normal mood and affect. His behavior is normal.    Diabetic Foot Exam - Simple   Simple Foot Form Diabetic Foot exam was performed with the following findings: Yes 05/01/2019  8:38 AM  Visual Inspection No deformities, no ulcerations, no other skin breakdown bilaterally: Yes Sensation Testing Intact to touch and monofilament testing bilaterally: Yes Pulse Check Posterior Tibialis and Dorsalis pulse intact bilaterally: Yes Comments         Assessment & Plan:   Physical exam: Screening blood work  ordered Immunizations    Discussed shingrix, MMR today, tdap today Colonoscopy   Due -  Dr Sarina Ser - in process of scheduling Eye exams   Up to date  Exercise   Trainer twice a week, walking, gym Weight  overweight Substance abuse   none Sees derm annually  See Problem List for Assessment and Plan of chronic medical problems.   FU in one year

## 2019-05-01 ENCOUNTER — Encounter: Payer: Self-pay | Admitting: Internal Medicine

## 2019-05-01 ENCOUNTER — Other Ambulatory Visit: Payer: Self-pay

## 2019-05-01 ENCOUNTER — Ambulatory Visit (INDEPENDENT_AMBULATORY_CARE_PROVIDER_SITE_OTHER): Payer: 59 | Admitting: Internal Medicine

## 2019-05-01 ENCOUNTER — Other Ambulatory Visit (INDEPENDENT_AMBULATORY_CARE_PROVIDER_SITE_OTHER): Payer: 59

## 2019-05-01 VITALS — BP 134/78 | HR 50 | Temp 98.3°F | Resp 16 | Ht 70.0 in | Wt 205.0 lb

## 2019-05-01 DIAGNOSIS — N400 Enlarged prostate without lower urinary tract symptoms: Secondary | ICD-10-CM | POA: Diagnosis not present

## 2019-05-01 DIAGNOSIS — Z8739 Personal history of other diseases of the musculoskeletal system and connective tissue: Secondary | ICD-10-CM

## 2019-05-01 DIAGNOSIS — E119 Type 2 diabetes mellitus without complications: Secondary | ICD-10-CM | POA: Diagnosis not present

## 2019-05-01 DIAGNOSIS — Z0000 Encounter for general adult medical examination without abnormal findings: Secondary | ICD-10-CM

## 2019-05-01 DIAGNOSIS — Z23 Encounter for immunization: Secondary | ICD-10-CM

## 2019-05-01 DIAGNOSIS — E782 Mixed hyperlipidemia: Secondary | ICD-10-CM | POA: Diagnosis not present

## 2019-05-01 LAB — CBC WITH DIFFERENTIAL/PLATELET
Basophils Absolute: 0.1 10*3/uL (ref 0.0–0.1)
Basophils Relative: 0.9 % (ref 0.0–3.0)
Eosinophils Absolute: 0.1 10*3/uL (ref 0.0–0.7)
Eosinophils Relative: 2.5 % (ref 0.0–5.0)
HCT: 44.9 % (ref 39.0–52.0)
Hemoglobin: 15 g/dL (ref 13.0–17.0)
Lymphocytes Relative: 24.3 % (ref 12.0–46.0)
Lymphs Abs: 1.4 10*3/uL (ref 0.7–4.0)
MCHC: 33.3 g/dL (ref 30.0–36.0)
MCV: 96.5 fl (ref 78.0–100.0)
Monocytes Absolute: 0.4 10*3/uL (ref 0.1–1.0)
Monocytes Relative: 6.7 % (ref 3.0–12.0)
Neutro Abs: 3.7 10*3/uL (ref 1.4–7.7)
Neutrophils Relative %: 65.6 % (ref 43.0–77.0)
Platelets: 158 10*3/uL (ref 150.0–400.0)
RBC: 4.65 Mil/uL (ref 4.22–5.81)
RDW: 12.8 % (ref 11.5–15.5)
WBC: 5.6 10*3/uL (ref 4.0–10.5)

## 2019-05-01 LAB — COMPREHENSIVE METABOLIC PANEL
ALT: 20 U/L (ref 0–53)
AST: 26 U/L (ref 0–37)
Albumin: 4.5 g/dL (ref 3.5–5.2)
Alkaline Phosphatase: 69 U/L (ref 39–117)
BUN: 15 mg/dL (ref 6–23)
CO2: 28 mEq/L (ref 19–32)
Calcium: 9.7 mg/dL (ref 8.4–10.5)
Chloride: 100 mEq/L (ref 96–112)
Creatinine, Ser: 1.19 mg/dL (ref 0.40–1.50)
GFR: 61.73 mL/min (ref >60.00)
Glucose, Bld: 179 mg/dL — ABNORMAL HIGH (ref 70–99)
Potassium: 4.6 mEq/L (ref 3.5–5.1)
Sodium: 136 mEq/L (ref 135–145)
Total Bilirubin: 0.9 mg/dL (ref 0.2–1.2)
Total Protein: 6.8 g/dL (ref 6.0–8.3)

## 2019-05-01 LAB — HEMOGLOBIN A1C: Hgb A1c MFr Bld: 7.2 % — ABNORMAL HIGH (ref 4.6–6.5)

## 2019-05-01 LAB — LIPID PANEL
Cholesterol: 98 mg/dL (ref 0–200)
HDL: 53.9 mg/dL (ref >39.00)
LDL Cholesterol: 23 mg/dL (ref 0–99)
NonHDL: 43.61
Total CHOL/HDL Ratio: 2
Triglycerides: 101 mg/dL (ref 0.0–149.0)
VLDL: 20.2 mg/dL (ref 0.0–40.0)

## 2019-05-01 LAB — TSH: TSH: 2.79 u[IU]/mL (ref 0.35–4.50)

## 2019-05-01 LAB — MICROALBUMIN / CREATININE URINE RATIO
Creatinine,U: 43.4 mg/dL
Microalb Creat Ratio: 1.6 mg/g (ref 0.0–30.0)
Microalb, Ur: <0.7 mg/dL (ref 0.0–1.9)

## 2019-05-01 LAB — URIC ACID: Uric Acid, Serum: 5.6 mg/dL (ref 4.0–7.8)

## 2019-05-01 NOTE — Assessment & Plan Note (Addendum)
Check lipid panel , cmp, tsh On repatha, lipitor  Regular exercise and healthy diet encouraged

## 2019-05-01 NOTE — Addendum Note (Signed)
Addended by: Delice Bison E on: 05/01/2019 08:49 AM   Modules accepted: Orders

## 2019-05-01 NOTE — Assessment & Plan Note (Signed)
Check uric acid level No recent gout symptoms

## 2019-05-03 ENCOUNTER — Encounter: Payer: Self-pay | Admitting: Internal Medicine

## 2019-05-06 ENCOUNTER — Encounter: Payer: Self-pay | Admitting: Internal Medicine

## 2019-05-16 ENCOUNTER — Other Ambulatory Visit: Payer: Self-pay | Admitting: Pharmacist

## 2019-05-16 DIAGNOSIS — E782 Mixed hyperlipidemia: Secondary | ICD-10-CM

## 2019-05-16 MED ORDER — ATORVASTATIN CALCIUM 40 MG PO TABS: 40.0000 mg | ORAL_TABLET | Freq: Every day | ORAL | 3 refills | Status: DC

## 2019-05-21 ENCOUNTER — Encounter: Payer: Self-pay | Admitting: Internal Medicine

## 2019-05-31 ENCOUNTER — Encounter: Payer: Self-pay | Admitting: Internal Medicine

## 2019-06-01 ENCOUNTER — Other Ambulatory Visit: Payer: Self-pay | Admitting: Internal Medicine

## 2019-06-07 LAB — HM COLONOSCOPY

## 2019-06-14 ENCOUNTER — Other Ambulatory Visit: Payer: Self-pay

## 2019-06-14 ENCOUNTER — Ambulatory Visit (INDEPENDENT_AMBULATORY_CARE_PROVIDER_SITE_OTHER): Payer: 59 | Admitting: Internal Medicine

## 2019-06-14 ENCOUNTER — Encounter: Payer: Self-pay | Admitting: Internal Medicine

## 2019-06-14 DIAGNOSIS — E663 Overweight: Secondary | ICD-10-CM | POA: Diagnosis not present

## 2019-06-14 DIAGNOSIS — E782 Mixed hyperlipidemia: Secondary | ICD-10-CM

## 2019-06-14 DIAGNOSIS — E1021 Type 1 diabetes mellitus with diabetic nephropathy: Secondary | ICD-10-CM | POA: Diagnosis not present

## 2019-06-14 MED ORDER — TOUJEO MAX SOLOSTAR 300 UNIT/ML ~~LOC~~ SOPN: 22.0000 [IU] | PEN_INJECTOR | Freq: Every day | SUBCUTANEOUS | 3 refills | Status: DC

## 2019-06-14 MED ORDER — OZEMPIC (0.25 OR 0.5 MG/DOSE) 2 MG/1.5ML ~~LOC~~ SOPN: 0.5000 mg | PEN_INJECTOR | SUBCUTANEOUS | 5 refills | Status: DC

## 2019-06-14 NOTE — Progress Notes (Signed)
Patient ID: Luke West, male   DOB: 1955/11/15, 63 y.o.   MRN: [id number]  Patient location: Home My location: Office Persons participating in the virtual visit: patient, provider  Referring Provider: Binnie Rail, MD  I connected with the patient on 06/14/19 at  8:04 AM EST by a video enabled telemedicine application and verified that I am speaking with the correct person.   I discussed the limitations of evaluation and management by telemedicine and the availability of in person appointments. The patient expressed understanding and agreed to proceed.   Details of the encounter are shown below.  HPI: Luke West is a 63 y.o.-year-old male, returning for follow-up for DM2, dx in 2003, but diagnosed as DM1 in 08/2017, insulin-dependent since 08/2017, uncontrolled, with complications (CKD). Last visit was 4 months ago (virtual).  Reviewed HbA1c levels: Lab Results  Component Value Date   HGBA1C 7.2 (H) 05/01/2019   HGBA1C 6.4 (A) 02/15/2019   HGBA1C 6.9 (H) 04/19/2018  04/06/2016: HbA1c calculated from fructosamine: 6.08%  Pt is on a regimen of: - Invokana 100 mg in am >> will start Jardiance 25 mg in am  In 1 mo - Onglyza 5 mg before b'fast - Glipizide 5 mg before lunch - Toujeo 14 >> 22 units daily Previously on JanuMet and Kombiglyze. He could not tolerate metformin ER so we stopped in 02/2018.  He checks his sugars more than 4 times a day with his freestyle libre CGM (reports were downloaded and will be scanned): - am: 130-150 - 2h after b'fast: ? - lunch: 160-170 - 2h after lunch: >200-250, 300 - dinner: 130-150 - 2h after dinner: <140 - bedtime: see above  - Average: 133  >> 147 - % active CGM time: 66% >> 65%of the time - Glucose variability 30.5% >> 26.3% (target < or = to 36%) - time in range:  - very low (<54): 0% >> 0% - low (54-69): 2% >> 3% - normal range (70-180): 86% >> 82% - high sugars (181-250): 11% >> 14% - very high sugars (>250): 1% >>  1%   Prev:  Lowest sugar was 41 before dinner >> .Marland KitchenMarland Kitchen50s (libre) >> 66; he has hypoglycemia awareness in the 62s. Highest sugar was <300 >> 250s >> 300.  Glucometer: One Touch Ultra Mini  Pt's meals are: - Breakfast: 3 days a week, yoghurt  - Lunch: sandwich, soup, chilli - Dinner: meat, veggies, strach - Snacks: 1+ - nuts; crackers + PB, chips and salsa Drinks water, and sweet tea.  She was exercising 5 times a week at the gym but not since the coronavirus pandemic.  -+ Mild CKD, last BUN/creatinine:  Lab Results  Component Value Date   BUN 15 05/01/2019   CREATININE 1.19 05/01/2019   Lab Results  Component Value Date   GFRNONAA 72 12/12/2018   GFRNONAA 61 08/23/2018   GFRNONAA 71 03/20/2018   GFRNONAA 72.64 03/05/2010   GFRNONAA 67.46 09/11/2008   GFRNONAA 52 08/21/2007   GFRNONAA 75 08/22/2006   No MAU: Lab Results  Component Value Date   MICRALBCREAT 1.6 05/01/2019   MICRALBCREAT 0.9 08/13/2016   MICRALBCREAT 0.8 07/17/2014   MICRALBCREAT 0.7 08/10/2013   MICRALBCREAT 0.8 03/02/2012   MICRALBCREAT 1.7 10/27/2011   MICRALBCREAT 1.1 07/26/2011   MICRALBCREAT 0.9 03/05/2010   MICRALBCREAT 2.8 10/10/2008   MICRALBCREAT 29.8 03/21/2008   -+ HL; last set of lipids: Lab Results  Component Value Date   CHOL 98 05/01/2019   HDL 53.90 05/01/2019  LDLCALC 23 05/01/2019   LDLDIRECT 118.0 07/13/2017   TRIG 101.0 05/01/2019   CHOLHDL 2 05/01/2019  He restarted back on Lipitor in 04/2018. He increased Lipitor to 40 mg daily, then 80 mg daily 07/2018.  Since last visit, he also added Repatha. - last eye exam was by Dr. Kathrin Penner: 03/2019: No DR - he denies numbness and tingling in his feet.  He has a history of Raynaud's phenomenon.  He had a clean cath last fall (02/2018).  He sees Dr. Oval Linsey  He also has a history of HTN.  ROS: Constitutional: no weight gain/no weight loss, no fatigue, no subjective hyperthermia, no subjective hypothermia Eyes: no  blurry vision, no xerophthalmia ENT: no sore throat, no nodules palpated in neck, no dysphagia, no odynophagia, no hoarseness Cardiovascular: no CP/no SOB/no palpitations/no leg swelling Respiratory: no cough/no SOB/no wheezing Gastrointestinal: no N/no V/no D/no C/no acid reflux Musculoskeletal: no muscle aches/no joint aches Skin: no rashes, no hair loss Neurological: no tremors/no numbness/no tingling/no dizziness  I reviewed pt's medications, allergies, PMH, social hx, family hx, and changes were documented in the history of present illness. Otherwise, unchanged from my initial visit note.  Past Medical History:  Diagnosis Date  . DM (diabetes mellitus) ([place])   . [name] syndrome 2008   Total bilirubin 1.4  . Hyperlipemia   . Raynaud phenomenon    Past Surgical History:  Procedure Laterality Date  . COLONOSCOPY  2005 & 2010   negative X 2; Dr Watt Climes (due 2020)  . LEFT HEART CATH AND CORONARY ANGIOGRAPHY N/A 03/21/2018   Procedure: LEFT HEART CATH AND CORONARY ANGIOGRAPHY;  Surgeon: Martinique, Peter M, MD;  Location: [facility] [facility];  Service: Cardiovascular;  Laterality: N/A;  . SHOULDER SURGERY     post dislocation sequellae  . TONSILLECTOMY AND ADENOIDECTOMY     History   Social History  . Marital Status: Married    Spouse Name: N/A  . Number of Children: 3   Occupational History  . Real estate development/construction   Social History Main Topics  . Smoking status: Former Smoker    Quit date: 06/28/1990  . Smokeless tobacco: Not on file     Comment: [id number] 3-4 year . Smoked [phone] , up to 1-2 ppd  . Alcohol Use: 8.4 oz/week    14 Glasses of wine per week     Comment:  socially  . Drug Use: No   Current Outpatient Medications on File Prior to Visit  Medication Sig Dispense Refill  . aspirin 81 MG tablet Take 81 mg by mouth daily.     Marland Kitchen atorvastatin (LIPITOR) 40 MG tablet Take 1 tablet (40 mg total) by mouth daily. 90 tablet 3  . Continuous Blood Gluc  Sensor (FREESTYLE LIBRE 14 DAY SENSOR) MISC APPLY SENSOR AS DIRECTED AND REPLACE EVERY 14 DAYS 2 each 11  . empagliflozin (JARDIANCE) 25 MG TABS tablet Take 25 mg by mouth daily before breakfast. 90 tablet 3  . Evolocumab (REPATHA SURECLICK) XX123456 MG/ML SOAJ Inject 140 mg into the skin every 14 (fourteen) days. 2 pen 6  . glipiZIDE (GLUCOTROL) 5 MG tablet Take 1 tablet (5 mg total) by mouth daily before breakfast. 90 tablet 3  . Insulin Glargine, 2 Unit Dial, (TOUJEO MAX SOLOSTAR) 300 UNIT/ML SOPN Inject 14-16 Units into the skin daily before breakfast. 6 pen 3  . Insulin Pen Needle (BD PEN NEEDLE NANO U/F) 32G X 4 MM MISC USE ONE TIME A DAY 100 each 3  . Multiple  Vitamin (MULTIVITAMIN) tablet Take 1 tablet by mouth daily.    . saxagliptin HCl (ONGLYZA) 5 MG TABS tablet Take 1 tablet (5 mg total) by mouth daily. 90 tablet 3   No current facility-administered medications on file prior to visit.   No Known Allergies Family History  Problem Relation Age of Onset  . Aneurysm Mother        cns  . Hyperlipidemia Mother   . Lung cancer Father        smoker  . Aneurysm Father        AAA  . Heart attack Sister 56  . Diabetes Brother        type 1  . Lung cancer Paternal Uncle        smoker  . Aneurysm Maternal Grandmother        cns;PRE 67 (mid 35s)  . Diabetes Maternal Grandfather        type 1  . Lung cancer Paternal Grandfather        smoker  . Aneurysm Paternal Grandmother        AAA   PE: There were no vitals taken for this visit. There is no height or weight on file to calculate BMI. Wt Readings from Last 3 Encounters:  05/01/19 205 lb (93 kg)  03/08/19 199 lb 9.6 oz (90.5 kg)  02/15/19 199 lb (90.3 kg)   Constitutional:  in NAD  The physical exam was not performed (virtual visit).  ASSESSMENT: 1. DM1, uncontrolled, with complications - CKD  We diagnosed insulin deficiency, consistent with a diagnosis of type 1 diabetes.  No detectable autoimmunity: Component     Latest  Ref Rng & Units 09/02/2017  ZNT8 Antibodies     U/mL <15  Glutamic Acid Decarb Ab     <5 IU/mL <5  C-Peptide     0.80 - 3.85 ng/mL 0.51 (L)  Glucose, Plasma     65 - 99 mg/dL 141 (H)  Islet Cell Ab     Neg:<1:1 Negative   2. HL  3.  Overweight  PLAN:  1. Patient with longstanding diabetes, initially diagnosed as type II, but most recently has type I.  His sugars improved significantly after adding basal insulin.  At last visit, HbA1c was better, however, he had another check last month with PCP and this was higher, at 7.2%.  We discussed at last visit that we may need mealtime insulin at least with the most problematic meal initially.  We did not have to do it then. -At this visit, sugars are slightly higher especially after lunch.  At the rest of the times of the day, he is doing fairly well, with occasional low blood sugars observed on CGM, mostly in the 60s.  He is not taking glipizide usually before lunch or occasionally before breakfast if he does not have a lot of lunch.  Since sugars are still not well controlled especially after meals, we discussed about stopping Onglyza and glipizide and starting weekly GLP-1 receptor we discussed about benefits and possible side effects.  We will start low and increase the dose as tolerated. - I advised him to: Patient Instructions  Please continue: - Invokana 100 mg in am - Toujeo 22 units daily  Please stop the following in 1 week after starting Ozempic. - Onglyza 5 mg before b'fast - Glipizide 5 mg before lunch  Please start Ozempic 0.25 mg weekly in a.m. (for example on Sunday morning) x 4 weeks, then increase to 0.5 mg weekly  in a.m. if no nausea or hypoglycemia.  Please come back for a follow-up appointment in 4 months.  - we would check his HbA1c when he returns to the clinic - advised to check sugars at different times of the day - 4x a day, rotating check times - advised for yearly eye exams >> he is UTD - return to clinic in 4  months    2. HL - Reviewed latest lipid panel from last month: All fractions at goal, LDL significantly improved Lab Results  Component Value Date   CHOL 98 05/01/2019   HDL 53.90 05/01/2019   LDLCALC 23 05/01/2019   LDLDIRECT 118.0 07/13/2017   TRIG 101.0 05/01/2019   CHOLHDL 2 05/01/2019  - Continues Lipitor and is now Repatha, started after last visit, without side effects.  3.  Overweight -He gained 6 pounds since last visit -He was not able to exercise during the coronavirus pandemic as gyms were closed -Reviewed O[place]ata was also help with weight loss -we will change Onglyza to a GLP-1 receptor agonist, which can offer additional weight loss benefits   Philemon Kingdom, MD PhD [facility] Endocrinology

## 2019-06-14 NOTE — Patient Instructions (Addendum)
Please continue: - Invokana 100 mg in am - Toujeo 22 units daily  Please stop the following in 1 week after starting Ozempic. - Onglyza 5 mg before b'fast - Glipizide 5 mg before lunch  Please start Ozempic 0.25 mg weekly in a.m. (for example on Sunday morning) x 4 weeks, then increase to 0.5 mg weekly in a.m. if no nausea or hypoglycemia.  Please come back for a follow-up appointment in 4 months.

## 2019-06-18 ENCOUNTER — Telehealth: Payer: Self-pay

## 2019-06-18 ENCOUNTER — Other Ambulatory Visit: Payer: Self-pay

## 2019-06-18 MED ORDER — OZEMPIC (0.25 OR 0.5 MG/DOSE) 2 MG/1.5ML ~~LOC~~ SOPN: 0.5000 mg | PEN_INJECTOR | SUBCUTANEOUS | 5 refills | Status: DC

## 2019-06-18 NOTE — Telephone Encounter (Signed)
Prior authorization for Ozempic has been approved by patient's insurance.  Coverage is effective 06/18/2019 to 06/17/2020  PA Approval/Reference Number [id number]  Approval letter has been sent to scanning.

## 2019-07-19 ENCOUNTER — Encounter: Payer: Self-pay | Admitting: Internal Medicine

## 2019-07-19 ENCOUNTER — Telehealth: Payer: Self-pay | Admitting: Internal Medicine

## 2019-07-19 NOTE — Telephone Encounter (Signed)
NA

## 2019-07-19 NOTE — Telephone Encounter (Signed)
Error

## 2019-07-19 NOTE — Telephone Encounter (Signed)
MEDICATION: nsulin Pen Needle (BD PEN NEEDLE NANO U/F) 32G X 4 MM MISC [id number]   PHARMACY:    IS THIS A 90 DAY SUPPLY :   IS PATIENT OUT OF MEDICATION:   IF NOT; HOW MUCH IS LEFT:   LAST APPOINTMENT DATE: @1 /21/2021  NEXT APPOINTMENT DATE:@4 /19/2021  DO WE HAVE YOUR PERMISSION TO LEAVE A DETAILED MESSAGE:  OTHER COMMENTS:    **Let patient know to contact pharmacy at the end of the day to make sure medication is ready. **  ** Please notify patient to allow 48-72 hours to process**  **Encourage patient to contact the pharmacy for refills or they can request refills through [facility]**

## 2019-08-03 ENCOUNTER — Telehealth: Payer: Self-pay

## 2019-08-03 NOTE — Telephone Encounter (Signed)
Noted  

## 2019-08-03 NOTE — Telephone Encounter (Signed)
Patient called today to request a letter from Dr. Cruzita Lederer stating that he is high risk and needs to be able to get the vaccine-after speaking with Colletta Maryland it was decided that this letter should come from the patients PCP which is the advice I gave the patient-he verbalized an understanding-FYI

## 2019-08-23 ENCOUNTER — Other Ambulatory Visit: Payer: Self-pay | Admitting: Cardiovascular Disease

## 2019-10-12 ENCOUNTER — Ambulatory Visit: Payer: 59 | Admitting: Internal Medicine

## 2019-10-12 ENCOUNTER — Encounter: Payer: Self-pay | Admitting: Internal Medicine

## 2019-10-12 ENCOUNTER — Other Ambulatory Visit: Payer: Self-pay

## 2019-10-12 VITALS — BP 122/70 | HR 86 | Ht 70.0 in | Wt 195.0 lb

## 2019-10-12 DIAGNOSIS — E782 Mixed hyperlipidemia: Secondary | ICD-10-CM | POA: Diagnosis not present

## 2019-10-12 DIAGNOSIS — E663 Overweight: Secondary | ICD-10-CM | POA: Diagnosis not present

## 2019-10-12 DIAGNOSIS — E1021 Type 1 diabetes mellitus with diabetic nephropathy: Secondary | ICD-10-CM | POA: Diagnosis not present

## 2019-10-12 LAB — POCT GLYCOSYLATED HEMOGLOBIN (HGB A1C): Hemoglobin A1C: 7.4 % — AB (ref 4.0–5.6)

## 2019-10-12 MED ORDER — OZEMPIC (1 MG/DOSE) 4 MG/3ML ~~LOC~~ SOPN: 1.0000 mg | PEN_INJECTOR | SUBCUTANEOUS | 3 refills | Status: DC

## 2019-10-12 MED ORDER — TOUJEO MAX SOLOSTAR 300 UNIT/ML ~~LOC~~ SOPN: 22.0000 [IU] | PEN_INJECTOR | Freq: Every day | SUBCUTANEOUS | 3 refills | Status: DC

## 2019-10-12 NOTE — Patient Instructions (Signed)
Please continue: - Jardiance 25 mg in am - Toujeo 22 units daily  Please increase: - Ozempic to 1 mg weekly  Please come back for a follow-up appointment in 4 months.

## 2019-10-12 NOTE — Addendum Note (Signed)
Addended by: Cardell Peach I on: 10/12/2019 01:29 PM   Modules accepted: Orders

## 2019-10-12 NOTE — Progress Notes (Signed)
Patient ID: Luke West, male   DOB: Jul 11, 1955, 64 y.o.   MRN: [id number]  This visit occurred during the SARS-CoV-2 public health emergency.  Safety protocols were in place, including screening questions prior to the visit, additional usage of staff PPE, and extensive cleaning of exam room while observing appropriate contact time as indicated for disinfecting solutions.   HPI: Luke West is a 64 y.o.-year-old male, returning for follow-up for DM2, dx in 2003, but diagnosed as DM1 in 08/2017, insulin-dependent since 08/2017, uncontrolled, with complications (CKD). Last visit was 4 months ago (virtual).  Reviewed HbA1c levels: Lab Results  Component Value Date   HGBA1C 7.2 (H) 05/01/2019   HGBA1C 6.4 (A) 02/15/2019   HGBA1C 6.9 (H) 04/19/2018  04/06/2016: HbA1c calculated from fructosamine: 6.08%  Pt is on a regimen of: - Jardiance 25 mg in a.m. - Ozempic 0.5 mg weekly - started 05/2019 - Toujeo 14 >> 22 units daily We stopped Onglyza and glipizide in 05/2019 and switch to insulin Previously on Alba and Kombiglyze. He could not tolerate metformin ER so we stopped in 02/2018.  He checks his sugars more than 4 times a day with his freestyle libre CGM:  - Average: 133  >> 147 >> 161 - % active CGM time: 66% >> 65% >> 82% of the time - Glucose variability 30.5% >> 26.3% >> 35.3% (target < or = to 36%) - time in range:  - very low (<54): 0% >> 0% >>  0% - low (54-69): 2% >> 3% >> 0% - normal range (70-180): 86% >> 82% >> 72% - high sugars (181-250): 11% >> 14% >> 21% - very high sugars (>250): 1% >> 1% >> 7%   Prev.:  Prev:  Lowest sugar was 41 before dinner >> .Marland KitchenMarland Kitchen50s (libre) >> 72; he has hypoglycemia awareness in the 29s. Highest sugar was <300 >> 250s >> 300.  Glucometer: One Touch Ultra Mini  Pt's meals are: - Breakfast: 3 days a week, yoghurt  - Lunch: sandwich, soup, chilli - Dinner: meat, veggies, strach - Snacks: 1+ - nuts; crackers + PB, chips and  salsa Drinks water, and sweet tea.  She was exercising 5 times a week at the gym but less since the coronavirus pandemic. Now restarted exercise - has a personal.  -+ Mild CKD, last BUN/creatinine:  Lab Results  Component Value Date   BUN 15 05/01/2019   CREATININE 1.19 05/01/2019   Lab Results  Component Value Date   GFRNONAA 72 12/12/2018   GFRNONAA 61 08/23/2018   GFRNONAA 71 03/20/2018   GFRNONAA 72.64 03/05/2010   GFRNONAA 67.46 09/11/2008   GFRNONAA 52 08/21/2007   GFRNONAA 75 08/22/2006   No MAU: Lab Results  Component Value Date   MICRALBCREAT 1.6 05/01/2019   MICRALBCREAT 0.9 08/13/2016   MICRALBCREAT 0.8 07/17/2014   MICRALBCREAT 0.7 08/10/2013   MICRALBCREAT 0.8 03/02/2012   MICRALBCREAT 1.7 10/27/2011   MICRALBCREAT 1.1 07/26/2011   MICRALBCREAT 0.9 03/05/2010   MICRALBCREAT 2.8 10/10/2008   MICRALBCREAT 29.8 03/21/2008   -+ HL; last set of lipids: Lab Results  Component Value Date   CHOL 98 05/01/2019   HDL 53.90 05/01/2019   LDLCALC 23 05/01/2019   LDLDIRECT 118.0 07/13/2017   TRIG 101.0 05/01/2019   CHOLHDL 2 05/01/2019  He restarted back on Lipitor in 04/2018.  Now on Lipitor 80.  He is also on Repatha now. - last eye exam was by Dr. Kathrin Penner: 03/2019: No DR -No numbness and tingling in his  feet.  He has a history of Raynaud's phenomenon.  He had a clean cath last fall (02/2018).  He sees Dr. Oval Linsey  He also has a history of HTN.  ROS: Constitutional: no weight gain/no weight loss, no fatigue, no subjective hyperthermia, no subjective hypothermia Eyes: no blurry vision, no xerophthalmia ENT: no sore throat, no nodules palpated in neck, no dysphagia, no odynophagia, no hoarseness Cardiovascular: no CP/no SOB/no palpitations/no leg swelling Respiratory: no cough/no SOB/no wheezing Gastrointestinal: no N/no V/no D/no C/no acid reflux Musculoskeletal: no muscle aches/no joint aches Skin: no rashes, no hair loss Neurological: no  tremors/no numbness/no tingling/no dizziness  I reviewed pt's medications, allergies, PMH, social hx, family hx, and changes were documented in the history of present illness. Otherwise, unchanged from my initial visit note.  Past Medical History:  Diagnosis Date  . DM (diabetes mellitus) ([place])   . [name] syndrome 2008   Total bilirubin 1.4  . Hyperlipemia   . Raynaud phenomenon    Past Surgical History:  Procedure Laterality Date  . COLONOSCOPY  2005 & 2010   negative X 2; Dr Watt Climes (due 2020)  . LEFT HEART CATH AND CORONARY ANGIOGRAPHY N/A 03/21/2018   Procedure: LEFT HEART CATH AND CORONARY ANGIOGRAPHY;  Surgeon: Martinique, Peter M, MD;  Location: [place] [facility];  Service: Cardiovascular;  Laterality: N/A;  . SHOULDER SURGERY     post dislocation sequellae  . TONSILLECTOMY AND ADENOIDECTOMY     History   Social History  . Marital Status: Married    Spouse Name: N/A  . Number of Children: 3   Occupational History  . Real estate development/construction   Social History Main Topics  . Smoking status: Former Smoker    Quit date: 06/28/1990  . Smokeless tobacco: Not on file     Comment: [id number] 3-4 year . Smoked [phone] , up to 1-2 ppd  . Alcohol Use: 8.4 oz/week    14 Glasses of wine per week     Comment:  socially  . Drug Use: No   Current Outpatient Medications on File Prior to Visit  Medication Sig Dispense Refill  . aspirin 81 MG tablet Take 81 mg by mouth daily.     Marland Kitchen atorvastatin (LIPITOR) 40 MG tablet Take 1 tablet (40 mg total) by mouth daily. 90 tablet 3  . Continuous Blood Gluc Sensor (FREESTYLE LIBRE 14 DAY SENSOR) MISC APPLY SENSOR AS DIRECTED AND REPLACE EVERY 14 DAYS 2 each 11  . empagliflozin (JARDIANCE) 25 MG TABS tablet Take 25 mg by mouth daily before breakfast. 90 tablet 3  . Insulin Glargine, 2 Unit Dial, (TOUJEO MAX SOLOSTAR) 300 UNIT/ML SOPN Inject 22 Units into the skin daily before breakfast. 6 pen 3  . Insulin Pen Needle (BD PEN NEEDLE  NANO U/F) 32G X 4 MM MISC USE ONE TIME A DAY 100 each 3  . Multiple Vitamin (MULTIVITAMIN) tablet Take 1 tablet by mouth daily.    Marland Kitchen REPATHA SURECLICK [id number] MG/ML SOAJ ADMINISTER 1 ML UNDER THE SKIN EVERY 14 DAYS 2 mL 11  . Semaglutide,0.25 or 0.5MG /DOS, (OZEMPIC, 0.25 OR 0.5 MG/DOSE,) 2 MG/1.5ML SOPN Inject 0.5 mg into the skin once a week. 2 pen 5   No current facility-administered medications on file prior to visit.   No Known Allergies Family History  Problem Relation Age of Onset  . Aneurysm Mother        cns  . Hyperlipidemia Mother   . Lung cancer Father  smoker  . Aneurysm Father        AAA  . Heart attack Sister 9  . Diabetes Brother        type 1  . Lung cancer Paternal Uncle        smoker  . Aneurysm Maternal Grandmother        cns;PRE 62 (mid 14s)  . Diabetes Maternal Grandfather        type 1  . Lung cancer Paternal Grandfather        smoker  . Aneurysm Paternal Grandmother        AAA   PE: BP 122/70   Pulse 86   Ht 5\' 10"  (1.778 m)   Wt 195 lb (88.5 kg)   SpO2 96%   BMI 27.98 kg/m  Body mass index is 27.98 kg/m. Wt Readings from Last 3 Encounters:  10/12/19 195 lb (88.5 kg)  05/01/19 205 lb (93 kg)  03/08/19 199 lb 9.6 oz (90.5 kg)   Constitutional: overweight, in NAD Eyes: PERRLA, EOMI, no exophthalmos ENT: moist mucous membranes, no thyromegaly, no cervical lymphadenopathy Cardiovascular: RRR, No MRG Respiratory: CTA B Gastrointestinal: abdomen soft, NT, ND, BS+ Musculoskeletal: no deformities, strength intact in all 4 Skin: moist, warm, no rashes Neurological: no tremor with outstretched hands, DTR normal in all 4  ASSESSMENT: 1. DM1, uncontrolled, with complications - CKD  We diagnosed insulin deficiency, consistent with a diagnosis of type 1 diabetes.  No detectable autoimmunity: Component     Latest Ref Rng & Units 09/02/2017  ZNT8 Antibodies     U/mL <15  Glutamic Acid Decarb Ab     <5 IU/mL <5  C-Peptide     0.80 - 3.85 ng/mL  0.51 (L)  Glucose, Plasma     65 - 99 mg/dL 141 (H)  Islet Cell Ab     Neg:<1:1 Negative   2. HL  3.  Overweight  PLAN:  1. Patient with longstanding diabetes, initially diagnosed as type II, but most recently as type I.  Sugars improved significantly after adding basal insulin.  At last visit, however, sugars were slightly higher especially after lunch but he was doing fairly well with the rest of the sugars with even occasional low blood sugars observed on the CGM, mostly in the 60s.  We did not add mealtime insulin at that time.  I advised him to stop glipizide and Onglyza and switch to Ozempic. -He is tolerating Ozempic well, without GI side effects  -since last visit, he relaxed his diet and sugars are higher.  However, over the last week, he was trying to limit carbs and his sugars improved.  He occasionally has higher blood sugars if he eats more carbs with meals sometimes even just with exercise. -For now, we discussed about increasing to 1 mg weekly.  I advised him to getting back with me in approximately 3 weeks to let me know how this is doing if sugars are not we will need to start mealtime insulin, initially only with higher carb meals, but he may need to take this with every meal.  Also, we may need to start a low-dose of mealtime insulin even before exercise, if he continues to have hyperglycemic peaks after this.  For now, I advised him to have a small snack of 15 g of carbs before exercise to see if we can decrease the post exercise hypoglycemic peak - I advised him to: Patient Instructions  Please continue: - Jardiance 25 mg in am - Toujeo  22 units daily  Please increase: - Ozempic to 1 mg weekly  Please come back for a follow-up appointment in 4 months.  - we checked his HbA1c: 7.4% (higher) - advised to check sugars at different times of the day - 4x a day, rotating check times - advised for yearly eye exams >> he is UTD - return to clinic in 4 months   2.  HL -Reviewed latest panel from 04/2019: LDL at goal, the rest of the fractions also excellent Lab Results  Component Value Date   CHOL 98 05/01/2019   HDL 53.90 05/01/2019   LDLCALC 23 05/01/2019   LDLDIRECT 118.0 07/13/2017   TRIG 101.0 05/01/2019   CHOLHDL 2 05/01/2019  -Continues Lipitor and Repatha without side effects.  3.  Overweight -He gained 6 pounds before last visit.  He was not able to exercise during the coronavirus pandemic issues with lows. -We started Ozempic and will also Invokana, which should also help with weight loss.  We will also increase Ozempic now.  Philemon Kingdom, MD PhD [facility] Endocrinology

## 2019-10-15 ENCOUNTER — Ambulatory Visit: Payer: 59 | Admitting: Internal Medicine

## 2019-10-17 ENCOUNTER — Other Ambulatory Visit: Payer: Self-pay | Admitting: Internal Medicine

## 2019-11-02 ENCOUNTER — Encounter: Payer: Self-pay | Admitting: Internal Medicine

## 2019-11-13 ENCOUNTER — Other Ambulatory Visit: Payer: Self-pay

## 2019-11-13 MED ORDER — OZEMPIC (1 MG/DOSE) 4 MG/3ML ~~LOC~~ SOPN: 1.0000 mg | PEN_INJECTOR | SUBCUTANEOUS | 3 refills | Status: DC

## 2019-11-16 ENCOUNTER — Encounter: Payer: Self-pay | Admitting: Internal Medicine

## 2019-11-20 LAB — LIPID PANEL WITH LDL/HDL RATIO
Cholesterol, Total: 101 mg/dL (ref 100–199)
HDL: 62 mg/dL (ref >39)
LDL Chol Calc (NIH): 23 mg/dL (ref 0–99)
LDL/HDL Ratio: 0.4 ratio (ref 0.0–3.6)
Triglycerides: 77 mg/dL (ref 0–149)
VLDL Cholesterol Cal: 16 mg/dL (ref 5–40)

## 2020-02-11 ENCOUNTER — Ambulatory Visit: Payer: 59 | Admitting: Internal Medicine

## 2020-02-12 ENCOUNTER — Other Ambulatory Visit: Payer: Self-pay

## 2020-02-12 ENCOUNTER — Encounter: Payer: Self-pay | Admitting: Internal Medicine

## 2020-02-12 ENCOUNTER — Ambulatory Visit: Payer: 59 | Admitting: Internal Medicine

## 2020-02-12 VITALS — BP 118/70 | HR 66 | Ht 70.0 in | Wt 190.0 lb

## 2020-02-12 DIAGNOSIS — E1021 Type 1 diabetes mellitus with diabetic nephropathy: Secondary | ICD-10-CM | POA: Diagnosis not present

## 2020-02-12 DIAGNOSIS — E782 Mixed hyperlipidemia: Secondary | ICD-10-CM | POA: Diagnosis not present

## 2020-02-12 DIAGNOSIS — E663 Overweight: Secondary | ICD-10-CM

## 2020-02-12 LAB — POCT GLYCOSYLATED HEMOGLOBIN (HGB A1C): Hemoglobin A1C: 6.7 % — AB (ref 4.0–5.6)

## 2020-02-12 MED ORDER — LYUMJEV KWIKPEN 100 UNIT/ML ~~LOC~~ SOPN: 3.0000 [IU] | PEN_INJECTOR | Freq: Three times a day (TID) | SUBCUTANEOUS | 5 refills | Status: DC

## 2020-02-12 NOTE — Addendum Note (Signed)
Addended by: Cardell Peach I on: 02/12/2020 03:25 PM   Modules accepted: Orders

## 2020-02-12 NOTE — Progress Notes (Signed)
Patient ID: Luke West, male   DOB: 1955/12/04, 64 y.o.   MRN: [id number]  This visit occurred during the SARS-CoV-2 public health emergency.  Safety protocols were in place, including screening questions prior to the visit, additional usage of staff PPE, and extensive cleaning of exam room while observing appropriate contact time as indicated for disinfecting solutions.   HPI: Luke West is a 64 y.o.-year-old male, returning for follow-up for DM2, dx in 2003, but diagnosed as DM1 in 08/2017, insulin-dependent since 08/2017, uncontrolled, with complications (CKD). Last visit was 4 months ago.  Reviewed HbA1c levels: Lab Results  Component Value Date   HGBA1C 7.4 (A) 10/12/2019   HGBA1C 7.2 (H) 05/01/2019   HGBA1C 6.4 (A) 02/15/2019  04/06/2016: HbA1c calculated from fructosamine: 6.08%  Pt is on a regimen of: - Jardiance 25 mg in a.m. - Ozempic 0.5 mg weekly - started 05/2019 >> 1 mg weekly -increased 09/2019 - Toujeo 14 >> 22 units daily We stopped Onglyza and glipizide in 05/2019 and switch to insulin Previously on [place] and [place]. He could not tolerate metformin ER so we stopped in 02/2018.  He checks his sugars more than 4 times a day with his freestyle libre CGM:  - Average: 133  >> 147 >> 161 >> 144 - % active CGM time: 66% >> 65% >> 82% >> 61% of the time - Glucose variability 30.5% >> 26.3% >> 35.3% >> 33.9% (target < or = to 36%) - time in range:  - very low (<54): 0% >> 0% >>  0% >> 0% - low (54-69): 2% >> 3% >> 0% >> 0% - normal range (70-180): 86% >> 82% >> 72% >> 81% - high sugars (181-250): 11% >> 14% >> 21% >> 15%  - very high sugars (>250): 1% >> 1% >> 7% >> 4%   Prev.:  Prev.:   Lowest sugar was 41 before dinner >> .Marland KitchenMarland Kitchen50s (libre) >> 65 >> 53; he has hypoglycemia awareness in the 50s. Highest sugar was <300 >> 250s >> 300 >> 311.  Glucometer: One Touch Ultra Mini  Pt's meals are: - Breakfast: banana or egg + cheese + sausage - Lunch:  sandwich, soup, chilli - Dinner: meat, veggies, strach - Snacks: 1+ - nuts; crackers + PB, chips and salsa Drinks water, and sweet tea.  She was exercising 5 times a week at the gym but less since the coronavirus pandemic.  He restarted exercise before last visit, with a Physiological scientist.  -+ Mild CKD, last BUN/creatinine:  Lab Results  Component Value Date   BUN 15 05/01/2019   CREATININE 1.19 05/01/2019   Lab Results  Component Value Date   GFRNONAA 72 12/12/2018   GFRNONAA 61 08/23/2018   GFRNONAA 71 03/20/2018   GFRNONAA 72.64 03/05/2010   GFRNONAA 67.46 09/11/2008   GFRNONAA 52 08/21/2007   GFRNONAA 75 08/22/2006   No MAU: Lab Results  Component Value Date   MICRALBCREAT 1.6 05/01/2019   MICRALBCREAT 0.9 08/13/2016   MICRALBCREAT 0.8 07/17/2014   MICRALBCREAT 0.7 08/10/2013   MICRALBCREAT 0.8 03/02/2012   MICRALBCREAT 1.7 10/27/2011   MICRALBCREAT 1.1 07/26/2011   MICRALBCREAT 0.9 03/05/2010   MICRALBCREAT 2.8 10/10/2008   MICRALBCREAT 29.8 03/21/2008   -+ HL; last set of lipids: Lab Results  Component Value Date   CHOL 101 11/20/2019   HDL 62 11/20/2019   LDLCALC 23 11/20/2019   LDLDIRECT 118.0 07/13/2017   TRIG 77 11/20/2019   CHOLHDL 2 05/01/2019  He restarted back on  Lipitor in 04/2018.  He is currently on Lipitor 40 (dose decreased from 80 mg) and Repatha. - last eye exam was by Dr. Kathrin Penner: 03/2019: No DR -No numbness and tingling in his feet.  He had a clean cath last fall (02/2018).  He sees Dr. Oval Linsey  He also has a history of HTN.  He has Raynaud's phenomena.  ROS: Constitutional: no weight gain/no weight loss, no fatigue, no subjective hyperthermia, no subjective hypothermia Eyes: no blurry vision, no xerophthalmia ENT: no sore throat, no nodules palpated in neck, no dysphagia, no odynophagia, no hoarseness Cardiovascular: no CP/no SOB/no palpitations/no leg swelling Respiratory: no cough/no SOB/no wheezing Gastrointestinal: no N/no  V/no D/no C/no acid reflux Musculoskeletal: no muscle aches/no joint aches Skin: no rashes, no hair loss Neurological: no tremors/no numbness/no tingling/no dizziness  I reviewed pt's medications, allergies, PMH, social hx, family hx, and changes were documented in the history of present illness. Otherwise, unchanged from my initial visit note.  Past Medical History:  Diagnosis Date  . DM (diabetes mellitus) ([place])   . [name] syndrome 2008   Total bilirubin 1.4  . Hyperlipemia   . Raynaud phenomenon    Past Surgical History:  Procedure Laterality Date  . COLONOSCOPY  2005 & 2010   negative X 2; Dr Watt Climes (due 2020)  . LEFT HEART CATH AND CORONARY ANGIOGRAPHY N/A 03/21/2018   Procedure: LEFT HEART CATH AND CORONARY ANGIOGRAPHY;  Surgeon: Martinique, Peter M, MD;  Location: [facility];  Service: Cardiovascular;  Laterality: N/A;  . SHOULDER SURGERY     post dislocation sequellae  . TONSILLECTOMY AND ADENOIDECTOMY     History   Social History  . Marital Status: Married    Spouse Name: N/A  . Number of Children: 3   Occupational History  . Real estate development/construction   Social History Main Topics  . Smoking status: Former Smoker    Quit date: 06/28/1990  . Smokeless tobacco: Not on file     Comment: CZY:SAYTK 3-4 year . Smoked [phone] , up to 1-2 ppd  . Alcohol Use: 8.4 oz/week    14 Glasses of wine per week     Comment:  socially  . Drug Use: No   Current Outpatient Medications on File Prior to Visit  Medication Sig Dispense Refill  . aspirin 81 MG tablet Take 81 mg by mouth daily.     Marland Kitchen atorvastatin (LIPITOR) 40 MG tablet Take 1 tablet (40 mg total) by mouth daily. 90 tablet 3  . BD PEN NEEDLE NANO 2ND GEN 32G X 4 MM MISC USE ONCE DAILY 100 each 11  . Continuous Blood Gluc Sensor (FREESTYLE LIBRE 14 DAY SENSOR) MISC APPLY SENSOR AS DIRECTED AND REPLACE EVERY 14 DAYS 2 each 11  . empagliflozin (JARDIANCE) 25 MG TABS tablet Take 25 mg by mouth daily before  breakfast. 90 tablet 3  . insulin glargine, 2 Unit Dial, (TOUJEO MAX SOLOSTAR) 300 UNIT/ML Solostar Pen Inject 22 Units into the skin daily before breakfast. 6 pen 3  . Multiple Vitamin (MULTIVITAMIN) tablet Take 1 tablet by mouth daily.    Marland Kitchen REPATHA SURECLICK 355 MG/ML SOAJ ADMINISTER 1 ML UNDER THE SKIN EVERY 14 DAYS 2 mL 11  . Semaglutide, 1 MG/DOSE, (OZEMPIC, 1 MG/DOSE,) 4 MG/3ML SOPN Inject 1 mg into the skin once a week. 9 mL 3   No current facility-administered medications on file prior to visit.   No Known Allergies Family History  Problem Relation Age of Onset  .  Aneurysm Mother        cns  . Hyperlipidemia Mother   . Lung cancer Father        smoker  . Aneurysm Father        AAA  . Heart attack Sister 46  . Diabetes Brother        type 1  . Lung cancer Paternal Uncle        smoker  . Aneurysm Maternal Grandmother        cns;PRE 43 (mid 25s)  . Diabetes Maternal Grandfather        type 1  . Lung cancer Paternal Grandfather        smoker  . Aneurysm Paternal Grandmother        AAA   PE: BP 118/70   Pulse 66   Ht 5\' 10"  (1.778 m)   Wt 190 lb (86.2 kg)   SpO2 98%   BMI 27.26 kg/m  Body mass index is 27.26 kg/m. Wt Readings from Last 3 Encounters:  02/12/20 190 lb (86.2 kg)  10/12/19 195 lb (88.5 kg)  05/01/19 205 lb (93 kg)   Constitutional: overweight, in NAD Eyes: PERRLA, EOMI, no exophthalmos ENT: moist mucous membranes, no thyromegaly, no cervical lymphadenopathy Cardiovascular: RRR, No MRG Respiratory: CTA B Gastrointestinal: abdomen soft, NT, ND, BS+ Musculoskeletal: no deformities, strength intact in all 4 Skin: moist, warm, no rashes Neurological: no tremor with outstretched hands, DTR normal in all 4  ASSESSMENT: 1. DM1, uncontrolled, with complications - CKD  We diagnosed insulin deficiency, consistent with a diagnosis of type 1 diabetes.  No detectable autoimmunity: Component     Latest Ref Rng & Units 09/02/2017  ZNT8 Antibodies     U/mL  <15  Glutamic Acid Decarb Ab     <5 IU/mL <5  C-Peptide     0.80 - 3.85 ng/mL 0.51 (L)  Glucose, Plasma     65 - 99 mg/dL 141 (H)  Islet Cell Ab     Neg:<1:1 Negative   2. HL  3.  Overweight  PLAN:  1. Patient with longstanding, type I diabetes (initially diagnosed as type II).  Sugars improved significantly after adding basal insulin.  However, at last visit, they were above target after relaxing his diet and he started to limit carbs.  Sugars improved in the week prior to our visit.  He was occasionally having higher blood sugars if he ate more carbs with meals and sometimes even just with exercise.  We increased Ozempic to 1 mg weekly at that time.  He is tolerating this dose well. CGM interpretation: -At today's visit, we reviewed together his CGM tracings: His sugars appear to be high after breakfast, although this is a small meal, sometimes only a banana.  The sugars also increase after lunch and after dinner.  He appears to be in target range 81% of the time but this is probably due to his very well controlled sugars overnight.  I explained that the risk of complications increases with wide glycemic swings and we should try to reduce his postprandial excursions.  I suggested to add rapid acting insulin before meals, to start with breakfast, since this has the most significant postprandial hyperglycemic peak and then he will probably need to add this with the rest of the meals.  We will start with 3 to 6 units of Lyumjev.  I advised him to inject the insulin at the start of the meal, since this is a faster onset lispro. -He also  mentions that his sugars increase after exercise, however, reviewing his CGM tracings, they did not increase too much, only 30 to 40 mg/dL.  We will just continue to follow this for now.  We discussed about possible causes of hyperglycemia after exercise to include doing strength exercises and reaching his lactate level.  -I advised him to let me know if he has  hypoglycemia after starting short-acting insulin as we may need to reduce his Toujeo dosing - I advised him to: Patient Instructions  Please continue: - Jardiance 25 mg in am - Toujeo 22 units daily - Ozempic 1 mg weekly  Add: - Lyumjev 3-6 units before b'fast and may need to inject before the rest of the meals also  Please come back for a follow-up appointment in 4 months.  - we checked his HbA1c: 6.7% (better) - advised to check sugars at different times of the day - 4x a day, rotating check times - advised for yearly eye exams >> he is UTD - return to clinic in 4 months  2. HL -Reviewed his lipid panel from 10/2019: Significant improvement in LDL + all fractions at goal: Lab Results  Component Value Date   CHOL 101 11/20/2019   HDL 62 11/20/2019   LDLCALC 23 11/20/2019   LDLDIRECT 118.0 07/13/2017   TRIG 77 11/20/2019   CHOLHDL 2 05/01/2019  -Continues Lipitor and Repatha without side effects  3.  Overweight -before this visit, he lost ~10 pounds and he lost 5 more pounds at last visit -Continue GLP-1 receptor agonist and SGLT2 inhibitor, which should both help with weight loss  Philemon Kingdom, MD PhD [facility] Endocrinology

## 2020-02-12 NOTE — Patient Instructions (Signed)
Please continue: - Jardiance 25 mg in am - Toujeo 22 units daily - Ozempic 1 mg weekly  Add: - Lyumjev 3-6 units before b'fast and may need to inject before the rest of the meals also  Please come back for a follow-up appointment in 4 months.

## 2020-02-13 ENCOUNTER — Other Ambulatory Visit: Payer: Self-pay

## 2020-02-13 ENCOUNTER — Encounter: Payer: Self-pay | Admitting: Internal Medicine

## 2020-02-13 DIAGNOSIS — E1021 Type 1 diabetes mellitus with diabetic nephropathy: Secondary | ICD-10-CM

## 2020-02-13 MED ORDER — EMPAGLIFLOZIN 25 MG PO TABS: 25.0000 mg | ORAL_TABLET | Freq: Every day | ORAL | 3 refills | Status: DC

## 2020-02-13 MED ORDER — TOUJEO MAX SOLOSTAR 300 UNIT/ML ~~LOC~~ SOPN: 22.0000 [IU] | PEN_INJECTOR | Freq: Every day | SUBCUTANEOUS | 2 refills | Status: DC

## 2020-02-29 MED ORDER — BD PEN NEEDLE NANO 2ND GEN 32G X 4 MM MISC: 11 refills | Status: DC

## 2020-02-29 NOTE — Addendum Note (Signed)
Addended by: Cardell Peach I on: 02/29/2020 01:45 PM   Modules accepted: Orders

## 2020-03-17 ENCOUNTER — Ambulatory Visit: Payer: 59 | Admitting: Cardiovascular Disease

## 2020-03-17 ENCOUNTER — Other Ambulatory Visit: Payer: Self-pay

## 2020-03-17 ENCOUNTER — Encounter: Payer: Self-pay | Admitting: Cardiovascular Disease

## 2020-03-17 VITALS — BP 130/62 | HR 53 | Ht 70.0 in | Wt 193.0 lb

## 2020-03-17 DIAGNOSIS — Z8249 Family history of ischemic heart disease and other diseases of the circulatory system: Secondary | ICD-10-CM

## 2020-03-17 DIAGNOSIS — I10 Essential (primary) hypertension: Secondary | ICD-10-CM

## 2020-03-17 DIAGNOSIS — E782 Mixed hyperlipidemia: Secondary | ICD-10-CM

## 2020-03-17 NOTE — Progress Notes (Signed)
Cardiology Office Note   Date:  03/17/2020   ID:  Luke West, DOB [birth year], MRN [id number]  PCP:  Binnie Rail, MD Stac[facility] Burns Cardiologist:   Skeet Latch, MD   No chief complaint on file.    History of Present Illness: Luke West is a 64 y.o. male with diabetes mellitus type 2 and familial hyperlipidemia here for follow up.  He was initially seen 06/2015 to establish care.  [name] has a family history of premature coronary artery disease.  His brother and sister both had heart attacks in their 65s.  His father had a AAA.  He was previously a patient of Dr. Mar Daring and underwent stress testing 10 years ago that was negative for ischemia.  At his initial appointment he was exercising regularly and had no symptoms.  He was already on aspirin and a statin.  We discussed the pros and cons of coronary calcium scoring and elected to continue with medical management, diet, and exercise.  In July 2019 he had an episode of near syncope.  He had a heavy workout with a personal trainer and felt nearly exhausted afterwards.  He did a lot of leg exercises.  The following day he went to play golf and felt very tired and short of breath.  He had difficulty walking up the hills and felt dizzy when he got to the top of the hill.  He has several physician friends who urged him to get an EKG performed.  When he was unable to do so he went to his local emergency department where EKG was unchanged from prior.  However, he was found to have rhabdomyolysis with CK greater than 2200.  He was given IV fluids and his statin was held.  He was subsequently seen in our office and underwent left heart catheterization 02/2018 that revealed normal coronaries.  BP was elevated in the office but better at home on repeat.  He was referred to the lipid clinic and started on Repatha 01/2019.  Overall he has been doing well.  He noticed that prior to taking Repatha his blood glucose was averaging in the 130s. He  worked with Dr. Cruzita Lederer and his blood sugars have been much better controlled.  He exercises 4 to 5 days/week and has no exertional chest pain or shortness of breath.  He does Pilates, golf, and hikes.  He denies any lower extremity edema, orthopnea, or PND.  Overall he has been doing well.   Past Medical History:  Diagnosis Date  . DM (diabetes mellitus) ([place])   . [name] syndrome 2008   Total bilirubin 1.4  . Hyperlipemia   . Raynaud phenomenon     Past Surgical History:  Procedure Laterality Date  . COLONOSCOPY  2005 & 2010   negative X 2; Dr Watt Climes (due 2020)  . LEFT HEART CATH AND CORONARY ANGIOGRAPHY N/A 03/21/2018   Procedure: LEFT HEART CATH AND CORONARY ANGIOGRAPHY;  Surgeon: Martinique, Peter M, MD;  Location: [facility];  Service: Cardiovascular;  Laterality: N/A;  . SHOULDER SURGERY     post dislocation sequellae  . TONSILLECTOMY AND ADENOIDECTOMY       Current Outpatient Medications  Medication Sig Dispense Refill  . aspirin 81 MG tablet Take 81 mg by mouth daily.     . Continuous Blood Gluc Sensor (FREESTYLE LIBRE 14 DAY SENSOR) MISC APPLY SENSOR AS DIRECTED AND REPLACE EVERY 14 DAYS 2 each 11  . empagliflozin (JARDIANCE) 25 MG TABS  tablet Take 1 tablet (25 mg total) by mouth daily before breakfast. 90 tablet 3  . insulin glargine, 2 Unit Dial, (TOUJEO MAX SOLOSTAR) 300 UNIT/ML Solostar Pen Inject 22 Units into the skin daily before breakfast. 3 mL 2  . Insulin Pen Needle (BD PEN NEEDLE NANO 2ND GEN) 32G X 4 MM MISC Use 3 a day to inject insulin. 120 each 11  . LYUMJEV KWIKPEN 100 UNIT/ML SOPN Inject 3-6 Units into the skin 3 (three) times daily before meals. 15 mL 5  . Multiple Vitamin (MULTIVITAMIN) tablet Take 1 tablet by mouth daily.    Marland Kitchen REPATHA SURECLICK 409 MG/ML SOAJ ADMINISTER 1 ML UNDER THE SKIN EVERY 14 DAYS 2 mL 11  . Semaglutide, 1 MG/DOSE, (OZEMPIC, 1 MG/DOSE,) 4 MG/3ML SOPN Inject 1 mg into the skin once a week. 9 mL 3  . atorvastatin (LIPITOR) 40 MG  tablet Take 1 tablet (40 mg total) by mouth daily. 90 tablet 3   No current facility-administered medications for this visit.    Allergies:   Patient has no known allergies.    Social History:  The patient  reports that he quit smoking about 29 years ago. He has never used smokeless tobacco. He reports current alcohol use of about 14.0 standard drinks of alcohol per week. He reports that he does not use drugs.   Family History:  The patient's family history includes Aneurysm in his father, maternal grandmother, mother, and paternal grandmother; Diabetes in his brother and maternal grandfather; Heart attack (age of onset: 43) in his sister; Hyperlipidemia in his mother; Lung cancer in his father, paternal grandfather, and paternal uncle.    ROS:  Please see the history of present illness.   Otherwise, review of systems are positive for none.   All other systems are reviewed and negative.    PHYSICAL EXAM: VS:  BP 130/62   Pulse (!) 53   Ht 5\' 10"  (1.778 m)   Wt 193 lb (87.5 kg)   SpO2 97%   BMI 27.69 kg/m  , BMI Body mass index is 27.69 kg/m. GENERAL:  Well appearing HEENT: Pupils equal round and reactive, fundi not visualized, oral mucosa unremarkable NECK:  No jugular venous distention, waveform within normal limits, carotid upstroke brisk and symmetric, no bruits LUNGS:  Clear to auscultation bilaterally HEART:  RRR.  PMI not displaced or sustained,S1 and S2 within normal limits, no S3, no S4, no clicks, no rubs, no murmurs ABD:  Flat, positive bowel sounds normal in frequency in pitch, no bruits, no rebound, no guarding, no midline pulsatile mass, no hepatomegaly, no splenomegaly EXT:  2 plus pulses throughout, no edema, no cyanosis no clubbing SKIN:  No rashes no nodules NEURO:  Cranial nerves II through XII grossly intact, motor grossly intact throughout PSYCH:  Cognitively intact, oriented to person place and time   EKG:  EKG is ordered today. The ekg ordered 07/10/15  demonstrates sinus bradycardia.  Rate 47 bpm.  LVH with repolarization abnormality.  06/20/18: Sinus bradycardia.  Sinus arrhythmia.  Rate 46 bpm.  First degree AV block.  LVH with repolarization abnormality.  03/08/19: Sinus bradycardia.  Rate 49 beats per minute.  First-degree AV block.  Early repolarization abnormality. 03/17/20: Sinus bradycardia.  Rate 53 bpm.     Recent Labs: 05/01/2019: ALT 20; BUN 15; Creatinine, Ser 1.19; Hemoglobin 15.0; Platelets 158.0; Potassium 4.6; Sodium 136; TSH 2.79    Lipid Panel    Component Value Date/Time   CHOL 101 11/20/2019 0954  TRIG 77 11/20/2019 0954   HDL 62 11/20/2019 0954   CHOLHDL 2 05/01/2019 0847   VLDL 20.2 05/01/2019 0847   LDLCALC 23 11/20/2019 0954   LDLDIRECT 118.0 07/13/2017 0846      Wt Readings from Last 3 Encounters:  03/17/20 193 lb (87.5 kg)  02/12/20 190 lb (86.2 kg)  10/12/19 195 lb (88.5 kg)      ASSESSMENT AND PLAN:  # Cardiovascular risk:   # Familial hyperlipidemia: Mr. Cada had a cardiac cath 02/2018 that showed normal coronaries.  Given his DM, hyperlipidemia and family history, continue primary prevention with aspirin, Repatha, and  atorvastatin. Lipids are very well-controlled.  Continue with regular exercise.  Recommended that he ensure his children have their lipids checked as well.    # Elevated blood glucose: Improved after working with Dr. Cruzita Lederer.  # Elevated BP:  Initially elevated and better on repeat.  Continue to monitor.   Current medicines are reviewed at length with the patient today.  The patient does not have concerns regarding medicines.  The following changes have been made:  no change  Labs/ tests ordered today include:   Orders Placed This Encounter  Procedures  . EKG 12-Lead     Disposition:   FU with Mahir Prabhakar C. Oval Linsey, MD, [facility] in 2 years.     Signed, Brannon Levene C. Oval Linsey, MD, [facility]  03/17/2020 10:45 AM    [place]

## 2020-03-17 NOTE — Patient Instructions (Signed)
Medication Instructions:  Your physician recommends that you continue on your current medications as directed. Please refer to the Current Medication list given to you today.  *If you need a refill on your cardiac medications before your next appointment, please call your pharmacy*   Lab Work: NONE  If you have labs (blood work) drawn today and your tests are completely normal, you will receive your results only by: Marland Kitchen MyChart Message (if you have MyChart) OR . A paper copy in the mail If you have any lab test that is abnormal or we need to change your treatment, we will call you to review the results.   Testing/Procedures: NONE   Follow-Up: At [facility], you and your health needs are our priority.  As part of our continuing mission to provide you with exceptional heart care, we have created designated Provider Care Teams.  These Care Teams include your primary Cardiologist (physician) and Advanced Practice Providers (APPs -  Physician Assistants and Nurse Practitioners) who all work together to provide you with the care you need, when you need it.  We recommend signing up for the patient portal called "MyChart".  Sign up information is provided on this After Visit Summary.  MyChart is used to connect with patients for Virtual Visits (Telemedicine).  Patients are able to view lab/test results, encounter notes, upcoming appointments, etc.  Non-urgent messages can be sent to your provider as well.   To learn more about what you can do with MyChart, go to [email or web address].    Your next appointment:   2 year(s)  You will receive a reminder letter in the mail two months in advance. If you don't receive a letter, please call our office to schedule the follow-up appointment.  The format for your next appointment:   In Person  Provider:   You may see DR [facility]  or one of the following Advanced Practice Providers on your designated Care Team:    Kerin Ransom, PA-C  [place], [place]  Coletta Memos, [place]

## 2020-03-18 ENCOUNTER — Other Ambulatory Visit: Payer: Self-pay | Admitting: Cardiovascular Disease

## 2020-03-23 ENCOUNTER — Other Ambulatory Visit: Payer: Self-pay | Admitting: Internal Medicine

## 2020-03-26 ENCOUNTER — Other Ambulatory Visit: Payer: Self-pay | Admitting: Internal Medicine

## 2020-04-16 ENCOUNTER — Other Ambulatory Visit: Payer: Self-pay | Admitting: Endocrinology

## 2020-04-16 DIAGNOSIS — E1021 Type 1 diabetes mellitus with diabetic nephropathy: Secondary | ICD-10-CM

## 2020-04-30 ENCOUNTER — Encounter: Payer: Self-pay | Admitting: Internal Medicine

## 2020-04-30 NOTE — Progress Notes (Signed)
Subjective:    Patient ID: Luke West, male    DOB: Dec 07, 1955, 64 y.o.   MRN: [id number]  HPI He is here for a physical exam.   He denies any major concerns.  Medications and allergies reviewed with patient and updated if appropriate.  Patient Active Problem List   Diagnosis Date Noted  . Normal coronary arteries 07/27/2018  . Near syncope 03/21/2018  . Rhabdomyolysis 03/21/2018  . Family history of abdominal aortic aneurysm (AAA) 07/11/2017  . Psoriasis 03/15/2016  . Rosacea 03/15/2016  . Family history of premature CAD 06/12/2015  . Diabetes mellitus type II, non insulin dependent ([place]) 04/15/2015  . Nocturnal leg cramps 12/11/2013  . Benign prostatic hyperplasia 03/05/2010  . Mixed hyperlipidemia 09/21/2007  . History of gout 05/11/2007  . RAYNAUD'S SYNDROME 05/11/2007    Current Outpatient Medications on File Prior to Visit  Medication Sig Dispense Refill  . aspirin 81 MG tablet Take 81 mg by mouth daily.     Marland Kitchen atorvastatin (LIPITOR) 40 MG tablet TAKE 1 TABLET BY MOUTH  DAILY 90 tablet 3  . Continuous Blood Gluc Sensor (FREESTYLE LIBRE 14 DAY SENSOR) MISC APPLY SENSOR AS DIRECTED AND REPLACE EVERY 14 DAYS 2 each 11  . Insulin Pen Needle (BD PEN NEEDLE NANO 2ND GEN) 32G X 4 MM MISC Use 3 a day to inject insulin. 120 each 11  . JARDIANCE 25 MG TABS tablet TAKE 1 TABLET BY MOUTH  DAILY BEFORE BREAKFAST 90 tablet 3  . LYUMJEV KWIKPEN 100 UNIT/ML SOPN Inject 3-6 Units into the skin 3 (three) times daily before meals. 15 mL 5  . Multiple Vitamin (MULTIVITAMIN) tablet Take 1 tablet by mouth daily.    Marland Kitchen REPATHA SURECLICK 924 MG/ML SOAJ ADMINISTER 1 ML UNDER THE SKIN EVERY 14 DAYS 2 mL 11  . Semaglutide, 1 MG/DOSE, (OZEMPIC, 1 MG/DOSE,) 4 MG/3ML SOPN Inject 1 mg into the skin once a week. 9 mL 3  . TOUJEO MAX SOLOSTAR 300 UNIT/ML Solostar Pen ADMINISTER 22 UNITS UNDER THE SKIN DAILY BEFORE BREAKFAST 3 mL 2   No current facility-administered medications on file prior to  visit.    Past Medical History:  Diagnosis Date  . DM (diabetes mellitus) ([place])   . [name] syndrome 2008   Total bilirubin 1.4  . Hyperlipemia   . Raynaud phenomenon     Past Surgical History:  Procedure Laterality Date  . COLONOSCOPY  2005 & 2010   negative X 2; Dr Watt Climes (due 2020)  . LEFT HEART CATH AND CORONARY ANGIOGRAPHY N/A 03/21/2018   Procedure: LEFT HEART CATH AND CORONARY ANGIOGRAPHY;  Surgeon: Martinique, Peter M, MD;  Location: [facility];  Service: Cardiovascular;  Laterality: N/A;  . SHOULDER SURGERY     post dislocation sequellae  . TONSILLECTOMY AND ADENOIDECTOMY      Social History   Socioeconomic History  . Marital status: Married    Spouse name: Not on file  . Number of children: Not on file  . Years of education: Not on file  . Highest education level: Not on file  Occupational History  . Not on file  Tobacco Use  . Smoking status: Former Smoker    Quit date: 06/28/1990    Years since quitting: 29.8  . Smokeless tobacco: Never Used  . Tobacco comment: QAS:TMHDQ 3-4 year . Smoked [phone] , up to 1-2 ppd  Substance and Sexual Activity  . Alcohol use: Yes    Alcohol/week: 14.0 standard drinks  Types: 14 Glasses of wine per week    Comment:  socially  . Drug use: No  . Sexual activity: Not on file  Other Topics Concern  . Not on file  Social History Narrative  . Not on file   Social Determinants of Health   Financial Resource Strain:   . Difficulty of Paying Living Expenses: Not on file  Food Insecurity:   . Worried About Charity fundraiser in the Last Year: Not on file  . Ran Out of Food in the Last Year: Not on file  Transportation Needs:   . Lack of Transportation (Medical): Not on file  . Lack of Transportation (Non-Medical): Not on file  Physical Activity:   . Days of Exercise per Week: Not on file  . Minutes of Exercise per Session: Not on file  Stress:   . Feeling of Stress : Not on file  Social Connections:   .  Frequency of Communication with Friends and Family: Not on file  . Frequency of Social Gatherings with Friends and Family: Not on file  . Attends Religious Services: Not on file  . Active Member of Clubs or Organizations: Not on file  . Attends Archivist Meetings: Not on file  . Marital Status: Not on file    Family History  Problem Relation Age of Onset  . Aneurysm Mother        cns  . Hyperlipidemia Mother   . Lung cancer Father        smoker  . Aneurysm Father        AAA  . Heart attack Sister 75  . Diabetes Brother        type 1  . Lung cancer Paternal Uncle        smoker  . Aneurysm Maternal Grandmother        cns;PRE 38 (mid 71s)  . Diabetes Maternal Grandfather        type 1  . Lung cancer Paternal Grandfather        smoker  . Aneurysm Paternal Grandmother        AAA    Review of Systems  Constitutional: Negative for chills and fever.  Eyes: Negative for visual disturbance.  Respiratory: Negative for cough, shortness of breath and wheezing.   Cardiovascular: Negative for chest pain, palpitations and leg swelling.  Gastrointestinal: Negative for abdominal pain, blood in stool, constipation, diarrhea and nausea.       No gerd  Genitourinary: Negative for difficulty urinating, dysuria and hematuria.  Musculoskeletal: Negative for arthralgias and back pain.  Skin: Negative for color change and rash.  Neurological: Positive for light-headedness (occ, postural). Negative for numbness and headaches.  Psychiatric/Behavioral: Negative for dysphoric mood. The patient is not nervous/anxious.        Objective:   Vitals:   05/01/20 0745  BP: 118/70  Pulse: 64  Temp: 98 F (36.7 C)  SpO2: 97%   Filed Weights   05/01/20 0745  Weight: 191 lb (86.6 kg)   Body mass index is 27.41 kg/m.  BP Readings from Last 3 Encounters:  05/01/20 118/70  03/17/20 130/62  02/12/20 118/70    Wt Readings from Last 3 Encounters:  05/01/20 191 lb (86.6 kg)   03/17/20 193 lb (87.5 kg)  02/12/20 190 lb (86.2 kg)     Physical Exam Constitutional: He appears well-developed and well-nourished. No distress.  HENT:  Head: Normocephalic and atraumatic.  Right Ear: External ear normal.  Left Ear: External  ear normal.  Mouth/Throat: Oropharynx is clear and moist.  Normal ear canals and TM b/l  Eyes: Conjunctivae and EOM are normal.  Neck: Neck supple. No tracheal deviation present. No thyromegaly present.  No carotid bruit  Cardiovascular: Normal rate, regular rhythm, normal heart sounds and intact distal pulses.   No murmur heard. Pulmonary/Chest: Effort normal and breath sounds normal. No respiratory distress. He has no wheezes. He has no rales.  Abdominal: Soft.  Palpable aorta.  He exhibits no distension. There is no tenderness.  Genitourinary: deferred  Musculoskeletal: He exhibits no edema.  Lymphadenopathy:   He has no cervical adenopathy.  Skin: Skin is warm and dry. He is not diaphoretic.  Psychiatric: He has a normal mood and affect. His behavior is normal.         Assessment & Plan:   Physical exam: Screening blood work  ordered Immunizations  Flu vac done one month ago, had covid, shingrix #1 today Colonoscopy   Dr Watt Climes - up to date Eye exams   Up to date  - Dr Kathrin Penner Exercise   Regular - trainer 3/week, home gym 2/week Weight  Ok for age Substance abuse   none Sees derm annually Sees urology annualy   See Problem List for Assessment and Plan of chronic medical problems.   This visit occurred during the SARS-CoV-2 public health emergency.  Safety protocols were in place, including screening questions prior to the visit, additional usage of staff PPE, and extensive cleaning of exam room while observing appropriate contact time as indicated for disinfecting solutions.

## 2020-04-30 NOTE — Patient Instructions (Addendum)
Blood work was ordered.    All other Health Maintenance issues reviewed.   All recommended immunizations and age-appropriate screenings are up-to-date or discussed.  shingrix immunization administered today.   Medications reviewed and updated.  Changes include :   none  An ultrasound of your Aorta was ordered.   Please followup in 1 year    Health Maintenance, Male Adopting a healthy lifestyle and getting preventive care are important in promoting health and wellness. Ask your health care provider about:  The right schedule for you to have regular tests and exams.  Things you can do on your own to prevent diseases and keep yourself healthy. What should I know about diet, weight, and exercise? Eat a healthy diet   Eat a diet that includes plenty of vegetables, fruits, low-fat dairy products, and lean protein.  Do not eat a lot of foods that are high in solid fats, added sugars, or sodium. Maintain a healthy weight Body mass index (BMI) is a measurement that can be used to identify possible weight problems. It estimates body fat based on height and weight. Your health care provider can help determine your BMI and help you achieve or maintain a healthy weight. Get regular exercise Get regular exercise. This is one of the most important things you can do for your health. Most adults should:  Exercise for at least 150 minutes each week. The exercise should increase your heart rate and make you sweat (moderate-intensity exercise).  Do strengthening exercises at least twice a week. This is in addition to the moderate-intensity exercise.  Spend less time sitting. Even light physical activity can be beneficial. Watch cholesterol and blood lipids Have your blood tested for lipids and cholesterol at 64 years of age, then have this test every 5 years. You may need to have your cholesterol levels checked more often if:  Your lipid or cholesterol levels are high.  You are older than  64 years of age.  You are at high risk for heart disease. What should I know about cancer screening? Many types of cancers can be detected early and may often be prevented. Depending on your health history and family history, you may need to have cancer screening at various ages. This may include screening for:  Colorectal cancer.  Prostate cancer.  Skin cancer.  Lung cancer. What should I know about heart disease, diabetes, and high blood pressure? Blood pressure and heart disease  High blood pressure causes heart disease and increases the risk of stroke. This is more likely to develop in people who have high blood pressure readings, are of African descent, or are overweight.  Talk with your health care provider about your target blood pressure readings.  Have your blood pressure checked: ? Every 3-5 years if you are 47-14 years of age. ? Every year if you are 74 years old or older.  If you are between the ages of 63 and 39 and are a current or former smoker, ask your health care provider if you should have a one-time screening for abdominal aortic aneurysm (AAA). Diabetes Have regular diabetes screenings. This checks your fasting blood sugar level. Have the screening done:  Once every three years after age 31 if you are at a normal weight and have a low risk for diabetes.  More often and at a younger age if you are overweight or have a high risk for diabetes. What should I know about preventing infection? Hepatitis B If you have a higher risk for  hepatitis B, you should be screened for this virus. Talk with your health care provider to find out if you are at risk for hepatitis B infection. Hepatitis C Blood testing is recommended for:  Everyone born from [birth year] through 1965.  Anyone with known risk factors for hepatitis C. Sexually transmitted infections (STIs)  You should be screened each year for STIs, including gonorrhea and chlamydia, if: ? You are sexually active and  are younger than 64 years of age. ? You are older than 64 years of age and your health care provider tells you that you are at risk for this type of infection. ? Your sexual activity has changed since you were last screened, and you are at increased risk for chlamydia or gonorrhea. Ask your health care provider if you are at risk.  Ask your health care provider about whether you are at high risk for HIV. Your health care provider may recommend a prescription medicine to help prevent HIV infection. If you choose to take medicine to prevent HIV, you should first get tested for HIV. You should then be tested every 3 months for as long as you are taking the medicine. Follow these instructions at home: Lifestyle  Do not use any products that contain nicotine or tobacco, such as cigarettes, e-cigarettes, and chewing tobacco. If you need help quitting, ask your health care provider.  Do not use street drugs.  Do not share needles.  Ask your health care provider for help if you need support or information about quitting drugs. Alcohol use  Do not drink alcohol if your health care provider tells you not to drink.  If you drink alcohol: ? Limit how much you have to 0-2 drinks a day. ? Be aware of how much alcohol is in your drink. In the [place] one drink equals one 12 oz bottle of beer (355 mL), one 5 oz glass of wine (148 mL), or one 1 oz glass of hard liquor (44 mL). General instructions  Schedule regular health, dental, and eye exams.  Stay current with your vaccines.  Tell your health care provider if: ? You often feel depressed. ? You have ever been abused or do not feel safe at home. Summary  Adopting a healthy lifestyle and getting preventive care are important in promoting health and wellness.  Follow your health care provider's instructions about healthy diet, exercising, and getting tested or screened for diseases.  Follow your health care provider's instructions on monitoring  your cholesterol and blood pressure. This information is not intended to replace advice given to you by your health care provider. Make sure you discuss any questions you have with your health care provider. Document Revised: 06/07/2018 Document Reviewed: 06/07/2018 Elsevier Patient Education  2020 [employer].

## 2020-05-01 ENCOUNTER — Other Ambulatory Visit: Payer: Self-pay

## 2020-05-01 ENCOUNTER — Ambulatory Visit (INDEPENDENT_AMBULATORY_CARE_PROVIDER_SITE_OTHER): Payer: 59 | Admitting: Internal Medicine

## 2020-05-01 VITALS — BP 118/70 | HR 64 | Temp 98.0°F | Ht 70.0 in | Wt 191.0 lb

## 2020-05-01 DIAGNOSIS — Z23 Encounter for immunization: Secondary | ICD-10-CM | POA: Diagnosis not present

## 2020-05-01 DIAGNOSIS — Z0000 Encounter for general adult medical examination without abnormal findings: Secondary | ICD-10-CM

## 2020-05-01 DIAGNOSIS — Z136 Encounter for screening for cardiovascular disorders: Secondary | ICD-10-CM | POA: Diagnosis not present

## 2020-05-01 DIAGNOSIS — E119 Type 2 diabetes mellitus without complications: Secondary | ICD-10-CM | POA: Diagnosis not present

## 2020-05-01 DIAGNOSIS — N400 Enlarged prostate without lower urinary tract symptoms: Secondary | ICD-10-CM

## 2020-05-01 DIAGNOSIS — Z8249 Family history of ischemic heart disease and other diseases of the circulatory system: Secondary | ICD-10-CM

## 2020-05-01 DIAGNOSIS — E782 Mixed hyperlipidemia: Secondary | ICD-10-CM

## 2020-05-01 LAB — COMPREHENSIVE METABOLIC PANEL
ALT: 18 U/L (ref 0–53)
AST: 25 U/L (ref 0–37)
Albumin: 4.5 g/dL (ref 3.5–5.2)
Alkaline Phosphatase: 73 U/L (ref 39–117)
BUN: 18 mg/dL (ref 6–23)
CO2: 28 mEq/L (ref 19–32)
Calcium: 9.8 mg/dL (ref 8.4–10.5)
Chloride: 104 mEq/L (ref 96–112)
Creatinine, Ser: 1.23 mg/dL (ref 0.40–1.50)
GFR: 62.24 mL/min (ref >60.00)
Glucose, Bld: 105 mg/dL — ABNORMAL HIGH (ref 70–99)
Potassium: 4.7 mEq/L (ref 3.5–5.1)
Sodium: 138 mEq/L (ref 135–145)
Total Bilirubin: 0.7 mg/dL (ref 0.2–1.2)
Total Protein: 7 g/dL (ref 6.0–8.3)

## 2020-05-01 LAB — LIPID PANEL
Cholesterol: 116 mg/dL (ref 0–200)
HDL: 61.6 mg/dL (ref >39.00)
LDL Cholesterol: 42 mg/dL (ref 0–99)
NonHDL: 53.94
Total CHOL/HDL Ratio: 2
Triglycerides: 62 mg/dL (ref 0.0–149.0)
VLDL: 12.4 mg/dL (ref 0.0–40.0)

## 2020-05-01 LAB — CBC WITH DIFFERENTIAL/PLATELET
Basophils Absolute: 0 10*3/uL (ref 0.0–0.1)
Basophils Relative: 0.6 % (ref 0.0–3.0)
Eosinophils Absolute: 0.1 10*3/uL (ref 0.0–0.7)
Eosinophils Relative: 2.3 % (ref 0.0–5.0)
HCT: 46.7 % (ref 39.0–52.0)
Hemoglobin: 15.7 g/dL (ref 13.0–17.0)
Lymphocytes Relative: 28.6 % (ref 12.0–46.0)
Lymphs Abs: 1.7 10*3/uL (ref 0.7–4.0)
MCHC: 33.5 g/dL (ref 30.0–36.0)
MCV: 97 fl (ref 78.0–100.0)
Monocytes Absolute: 0.4 10*3/uL (ref 0.1–1.0)
Monocytes Relative: 7.3 % (ref 3.0–12.0)
Neutro Abs: 3.7 10*3/uL (ref 1.4–7.7)
Neutrophils Relative %: 61.2 % (ref 43.0–77.0)
Platelets: 182 10*3/uL (ref 150.0–400.0)
RBC: 4.81 Mil/uL (ref 4.22–5.81)
RDW: 12.8 % (ref 11.5–15.5)
WBC: 6 10*3/uL (ref 4.0–10.5)

## 2020-05-01 LAB — TSH: TSH: 2.6 u[IU]/mL (ref 0.35–4.50)

## 2020-05-01 LAB — MICROALBUMIN / CREATININE URINE RATIO
Creatinine,U: 116.6 mg/dL
Microalb Creat Ratio: 0.7 mg/g (ref 0.0–30.0)
Microalb, Ur: 0.8 mg/dL (ref 0.0–1.9)

## 2020-05-01 NOTE — Addendum Note (Signed)
Addended by: Marcina Millard on: 05/01/2020 04:40 PM   Modules accepted: Orders

## 2020-05-01 NOTE — Assessment & Plan Note (Signed)
Chronic Check lipid panel  Continue atorvastatin 40 mg daily Regular exercise and healthy diet encouraged  

## 2020-05-01 NOTE — Assessment & Plan Note (Signed)
Chronic Lab Results  Component Value Date   HGBA1C 6.7 (A) 02/12/2020   Sugars well controlled Management per Dr. Cruzita Lederer Has eye appointment scheduled

## 2020-05-01 NOTE — Assessment & Plan Note (Signed)
Sees urology annually

## 2020-05-01 NOTE — Assessment & Plan Note (Signed)
Dad had a AAA Abdominal ultrasound ordered for screening

## 2020-05-08 ENCOUNTER — Ambulatory Visit
Admission: RE | Admit: 2020-05-08 | Discharge: 2020-05-08 | Disposition: A | Discharge status: Home / Self Care | Payer: 59 | Source: Ambulatory Visit | Attending: Internal Medicine | Admitting: Internal Medicine

## 2020-05-08 ENCOUNTER — Encounter: Payer: Self-pay | Admitting: Internal Medicine

## 2020-05-08 DIAGNOSIS — Z136 Encounter for screening for cardiovascular disorders: Secondary | ICD-10-CM

## 2020-05-08 DIAGNOSIS — Z8249 Family history of ischemic heart disease and other diseases of the circulatory system: Secondary | ICD-10-CM

## 2020-05-08 DIAGNOSIS — I700 Atherosclerosis of aorta: Secondary | ICD-10-CM | POA: Insufficient documentation

## 2020-06-17 ENCOUNTER — Encounter: Payer: Self-pay | Admitting: Internal Medicine

## 2020-06-17 ENCOUNTER — Other Ambulatory Visit: Payer: Self-pay

## 2020-06-17 ENCOUNTER — Ambulatory Visit: Payer: 59 | Admitting: Internal Medicine

## 2020-06-17 VITALS — BP 122/80 | HR 56 | Ht 70.0 in | Wt 197.0 lb

## 2020-06-17 DIAGNOSIS — E782 Mixed hyperlipidemia: Secondary | ICD-10-CM | POA: Diagnosis not present

## 2020-06-17 DIAGNOSIS — E119 Type 2 diabetes mellitus without complications: Secondary | ICD-10-CM | POA: Diagnosis not present

## 2020-06-17 DIAGNOSIS — E663 Overweight: Secondary | ICD-10-CM | POA: Diagnosis not present

## 2020-06-17 LAB — POCT GLYCOSYLATED HEMOGLOBIN (HGB A1C): Hemoglobin A1C: 6.2 % — AB (ref 4.0–5.6)

## 2020-06-17 NOTE — Patient Instructions (Signed)
Please continue: - Jardiance 25 mg in am - Ozempic 1 mg weekly - Toujeo 22 units daily - Lyumjev 3 to 6 units before meals, and 3 units before a snack  Please come back for a follow-up appointment in 4 months.

## 2020-06-17 NOTE — Addendum Note (Signed)
Addended by: Lauralyn Primes on: 06/17/2020 10:04 AM   Modules accepted: Orders

## 2020-06-17 NOTE — Progress Notes (Signed)
Patient ID: Luke West, male   DOB: Feb 29, 1956, 64 y.o.   MRN: [id number]  This visit occurred during the SARS-CoV-2 public health emergency.  Safety protocols were in place, including screening questions prior to the visit, additional usage of staff PPE, and extensive cleaning of exam room while observing appropriate contact time as indicated for disinfecting solutions.   HPI: Luke West is a 64 y.o.-year-old male, returning for follow-up for DM2, dx in 2003, but diagnosed as DM1 in 08/2017, insulin-dependent since 08/2017, uncontrolled, with complications (CKD). Last visit was 4 months ago.  Reviewed HbA1c levels: Lab Results  Component Value Date   HGBA1C 6.7 (A) 02/12/2020   HGBA1C 7.4 (A) 10/12/2019   HGBA1C 7.2 (H) 05/01/2019  04/06/2016: HbA1c calculated from fructosamine: 6.08%  Pt is on a regimen of: - Jardiance 25 mg in a.m. - Ozempic 0.5 mg weekly - started 05/2019 >> 1 mg weekly -increased 09/2019 - Toujeo 14 >> 22 units daily - Lyumjev 3-4 units before breakfast, 4-6 units before lunch and dinner, 3 units before a snack We stopped Onglyza and glipizide in 05/2019 and switch to insulin Previously on [place] and Kombiglyze. He could not tolerate metformin ER so we stopped in 02/2018.  He checks his sugars more than 4 times a day with his freestyle libre CGM.  - Average: 133  >> 147 >> 161 >> 144 >> 138 - % active CGM time: 66% >> 65% >> 82% >> 61%  >> 93%of the time - Glucose variability 30.5% >> 26.3% >> 35.3% >> 33.9% >> 26.3% (target < or = to 36%) - time in range:  - very low (<54): 0% >> 0% >>  0% >> 0% >> 0% - low (54-69): 2% >> 3% >> 0% >> 0% >> 0% - normal range (70-180): 86% >> 82% >> 72% >> 81% >> 87% - high sugars (181-250): 11% >> 14% >> 21% >> 15% >> 12% - very high sugars (>250): 1% >> 1% >> 7% >> 4% >> 1%    Previously:   Prev.:   Lowest sugar was 41 before dinner >> .Marland KitchenMarland Kitchen50s (libre) >> 65 >> 79 >> 60s; he has hypoglycemia awareness in the  70s. Highest sugar was <300 >> 250s >> 300 >> 311 >> 250s.  Glucometer: One Touch Ultra Mini  Pt's meals are: - Breakfast: banana or egg + cheese + sausage - Lunch: sandwich, soup, chilli - Dinner: meat, veggies, strach - Snacks: 1+ - nuts; crackers + PB, chips and salsa Drinks water, and sweet tea.  He was exercising 5 times a week at the gym but less since the coronavirus pandemic.  He  is now exercising with a Physiological scientist.  -+ Mild CKD, last BUN/creatinine:  Lab Results  Component Value Date   BUN 18 05/01/2020   CREATININE 1.23 05/01/2020   Lab Results  Component Value Date   GFRNONAA 72 12/12/2018   GFRNONAA 61 08/23/2018   GFRNONAA 71 03/20/2018   GFRNONAA 72.64 03/05/2010   GFRNONAA 67.46 09/11/2008   GFRNONAA 52 08/21/2007   GFRNONAA 75 08/22/2006   No MAU: Lab Results  Component Value Date   MICRALBCREAT 0.7 05/01/2020   MICRALBCREAT 1.6 05/01/2019   MICRALBCREAT 0.9 08/13/2016   MICRALBCREAT 0.8 07/17/2014   MICRALBCREAT 0.7 08/10/2013   MICRALBCREAT 0.8 03/02/2012   MICRALBCREAT 1.7 10/27/2011   MICRALBCREAT 1.1 07/26/2011   MICRALBCREAT 0.9 03/05/2010   MICRALBCREAT 2.8 10/10/2008   -+ HL; last set of lipids: Lab Results  Component  Value Date   CHOL 116 05/01/2020   HDL 61.60 05/01/2020   LDLCALC 42 05/01/2020   LDLDIRECT 118.0 07/13/2017   TRIG 62.0 05/01/2020   CHOLHDL 2 05/01/2020  He restarted back on Lipitor in 04/2018.  He is currently on Lipitor 40 (dose decreased from 80 mg daily) and Repatha. - last eye exam was by Dr. Kathrin Penner: 03/2019: No DR - no numbness and tingling in his feet.  He had a clean cath last fall (02/2018).  He sees Dr. Oval Linsey  He also has a history of HTN.  He has Raynaud's phenomena.  ROS: Constitutional:+ weight gain/no weight loss, no fatigue, no subjective hyperthermia, no subjective hypothermia Eyes: no blurry vision, no xerophthalmia ENT: no sore throat, no nodules palpated in neck, no dysphagia, no  odynophagia, no hoarseness Cardiovascular: no CP/no SOB/no palpitations/no leg swelling Respiratory: no cough/no SOB/no wheezing Gastrointestinal: no N/no V/no D/no C/no acid reflux Musculoskeletal: no muscle aches/no joint aches Skin: no rashes, no hair loss Neurological: no tremors/no numbness/no tingling/no dizziness  I reviewed pt's medications, allergies, PMH, social hx, family hx, and changes were documented in the history of present illness. Otherwise, unchanged from my initial visit note.  Past Medical History:  Diagnosis Date  . DM (diabetes mellitus) ([place])   . [name] syndrome 2008   Total bilirubin 1.4  . Hyperlipemia   . Raynaud phenomenon    Past Surgical History:  Procedure Laterality Date  . COLONOSCOPY  2005 & 2010   negative X 2; Dr Watt Climes (due 2020)  . LEFT HEART CATH AND CORONARY ANGIOGRAPHY N/A 03/21/2018   Procedure: LEFT HEART CATH AND CORONARY ANGIOGRAPHY;  Surgeon: Martinique, Peter M, MD;  Location: [facility];  Service: Cardiovascular;  Laterality: N/A;  . SHOULDER SURGERY     post dislocation sequellae  . TONSILLECTOMY AND ADENOIDECTOMY     History   Social History  . Marital Status: Married    Spouse Name: N/A  . Number of Children: 3   Occupational History  . Real estate development/construction   Social History Main Topics  . Smoking status: Former Smoker    Quit date: 06/28/1990  . Smokeless tobacco: Not on file     Comment: KKX:FGHWE 3-4 year . Smoked [phone] , up to 1-2 ppd  . Alcohol Use: 8.4 oz/week    14 Glasses of wine per week     Comment:  socially  . Drug Use: No   Current Outpatient Medications on File Prior to Visit  Medication Sig Dispense Refill  . aspirin 81 MG tablet Take 81 mg by mouth daily.     Marland Kitchen atorvastatin (LIPITOR) 40 MG tablet TAKE 1 TABLET BY MOUTH  DAILY 90 tablet 3  . Continuous Blood Gluc Sensor (FREESTYLE LIBRE 14 DAY SENSOR) MISC APPLY SENSOR AS DIRECTED AND REPLACE EVERY 14 DAYS 2 each 11  . Insulin  Pen Needle (BD PEN NEEDLE NANO 2ND GEN) 32G X 4 MM MISC Use 3 a day to inject insulin. 120 each 11  . JARDIANCE 25 MG TABS tablet TAKE 1 TABLET BY MOUTH  DAILY BEFORE BREAKFAST 90 tablet 3  . LYUMJEV KWIKPEN 100 UNIT/ML SOPN Inject 3-6 Units into the skin 3 (three) times daily before meals. 15 mL 5  . Multiple Vitamin (MULTIVITAMIN) tablet Take 1 tablet by mouth daily.    Marland Kitchen REPATHA SURECLICK 789 MG/ML SOAJ ADMINISTER 1 ML UNDER THE SKIN EVERY 14 DAYS 2 mL 11  . Semaglutide, 1 MG/DOSE, (OZEMPIC, 1 MG/DOSE,) 4 MG/3ML  SOPN Inject 1 mg into the skin once a week. 9 mL 3  . TOUJEO MAX SOLOSTAR 300 UNIT/ML Solostar Pen ADMINISTER 22 UNITS UNDER THE SKIN DAILY BEFORE BREAKFAST 3 mL 2   No current facility-administered medications on file prior to visit.   No Known Allergies Family History  Problem Relation Age of Onset  . Aneurysm Mother        cns  . Hyperlipidemia Mother   . Lung cancer Father        smoker  . Aneurysm Father        AAA  . Heart attack Sister 56  . Diabetes Brother        type 1  . Lung cancer Paternal Uncle        smoker  . Aneurysm Maternal Grandmother        cns;PRE 65 (mid 20s)  . Diabetes Maternal Grandfather        type 1  . Lung cancer Paternal Grandfather        smoker  . Aneurysm Paternal Grandmother        AAA   PE: BP 122/80   Pulse (!) 56   Ht 5\' 10"  (1.778 m)   Wt 197 lb (89.4 kg)   SpO2 95%   BMI 28.27 kg/m  Body mass index is 28.27 kg/m. Wt Readings from Last 3 Encounters:  06/17/20 197 lb (89.4 kg)  05/01/20 191 lb (86.6 kg)  03/17/20 193 lb (87.5 kg)   Constitutional: overweight, in NAD Eyes: PERRLA, EOMI, no exophthalmos ENT: moist mucous membranes, no thyromegaly, no cervical lymphadenopathy Cardiovascular: RRR, No MRG Respiratory: CTA B Gastrointestinal: abdomen soft, NT, ND, BS+ Musculoskeletal: no deformities, strength intact in all 4 Skin: moist, warm, no rashes Neurological: no tremor with outstretched hands, DTR normal in all  4  ASSESSMENT: 1. DM1, uncontrolled, with complications - CKD  We diagnosed insulin deficiency, consistent with a diagnosis of type 1 diabetes.  No detectable autoimmunity: Component     Latest Ref Rng & Units 09/02/2017  ZNT8 Antibodies     U/mL <15  Glutamic Acid Decarb Ab     <5 IU/mL <5  C-Peptide     0.80 - 3.85 ng/mL 0.51 (L)  Glucose, Plasma     65 - 99 mg/dL 141 (H)  Islet Cell Ab     Neg:<1:1 Negative   2. HL  3.  Overweight  PLAN:  1. Patient with longstanding, type 1 diabetes, diagnosed as such in 2019, after initially being diagnosed as type II, with sugars improving significantly after adding basal insulin.  He is also on SGLT2 inhibitor and GLP-1 receptor agonist and at last visit we added rapid acting insulin with breakfast since his sugars were high after this meal.  Sugars were also increasing after lunch and dinner but not as much as after breakfast, despite breakfast being the smaller meal, sometimes only a banana.  At last visit I advised him to start Lyumjev before breakfast but I advised him that he may need this before the rest of the meals, also. CGM interpretation: -At today's visit, we reviewed her CGM downloads: It appears that 87% of values are in target range (goal >70%), while 30% are higher than 180 (goal <25%), and 0% are lower than 70 (goal <4%).  The calculated average blood sugar is 138.  The projected HbA1c for the next 3 months (GMI) is 6.6%.  He does mention that the last month was more busy for him and his  blood sugars are slightly worse than before -Reviewing the CGM trends, his sugars appear to be well controlled overnight, but they increase after breakfast and then after dinner.  The postprandial blood sugars are now much lower than before, the majority in the normal range, with slightly high levels, up to 190s after lunch.  He does a great job adjusting the dose of his Lyumjev before meals.  He also takes a low dose, 3 units, before snack in the  afternoon.  However, upon questioning, he may not take Lyumjev because sugars are lower than 130 before meals and we discussed that if he eats a meal, he probably needs to take it even if the sugars are lower, down to the 80s.  He agrees to do so.  Otherwise, I do not feel that he needs any other changes in his regimen. -He does tell me that he will change insurance from [place] [employer] to [employer] based per his workplace insurance preference.  He will let me know if we need to make any changes in his regimen. - I advised him to: Patient Instructions  Please continue: - Jardiance 25 mg in am - Ozempic 1 mg weekly - Toujeo 22 units daily - Lyumjev 3 to 6 units before meals, and 3 units before a snack  Please come back for a follow-up appointment in 4 months.  - we checked his HbA1c: 6.2% (lower) - advised to check sugars at different times of the day - 4x a day, rotating check times - advised for yearly eye exams >> he is not UTD but has an exam scheduled in 4 months based on his ophthalmologist availability. - return to clinic in 4 months  2. HL -Reviewed latest lipid panel from 04/2020 and all fractions were at goal, including an excellent LDL: Lab Results  Component Value Date   CHOL 116 05/01/2020   HDL 61.60 05/01/2020   LDLCALC 42 05/01/2020   LDLDIRECT 118.0 07/13/2017   TRIG 62.0 05/01/2020   CHOLHDL 2 05/01/2020  -Continues Lipitor 40 and Repatha without side effects  3.  Overweight -She lost approximately 15 pounds before the last visit combined, but gained 7 pounds since last visit, most likely due to addition of Lyumjev insulin -continue SGLT 2 inhibitor and GLP-1 receptor agonist which should also help with weight loss  Philemon Kingdom, MD PhD [facility] Endocrinology

## 2020-07-01 DIAGNOSIS — L720 Epidermal cyst: Secondary | ICD-10-CM | POA: Diagnosis not present

## 2020-07-01 DIAGNOSIS — L820 Inflamed seborrheic keratosis: Secondary | ICD-10-CM | POA: Diagnosis not present

## 2020-07-07 ENCOUNTER — Other Ambulatory Visit: Payer: Self-pay | Admitting: Cardiovascular Disease

## 2020-07-09 ENCOUNTER — Telehealth: Payer: Self-pay | Admitting: Cardiovascular Disease

## 2020-07-09 NOTE — Telephone Encounter (Signed)
Follow up:     [facility] calling to see if we have received a fax from them concering this patient. Please call [phone]. They would like for some to call concering this matter.

## 2020-07-09 NOTE — Telephone Encounter (Signed)
Called to let them know that raquel faxed pa today in office

## 2020-07-09 NOTE — Telephone Encounter (Signed)
Spoke with [facility], they report the repatha needs a prior authorization. She wanted to make sure we received their fax. Aware will forward to the pharm md.

## 2020-07-10 ENCOUNTER — Other Ambulatory Visit: Payer: Self-pay | Admitting: Internal Medicine

## 2020-07-10 ENCOUNTER — Encounter: Payer: Self-pay | Admitting: Internal Medicine

## 2020-07-10 DIAGNOSIS — E119 Type 2 diabetes mellitus without complications: Secondary | ICD-10-CM

## 2020-07-10 MED ORDER — FIASP FLEXTOUCH 100 UNIT/ML ~~LOC~~ SOPN: PEN_INJECTOR | SUBCUTANEOUS | 3 refills | Status: DC

## 2020-07-10 MED ORDER — ATORVASTATIN CALCIUM 40 MG PO TABS: 40.0000 mg | ORAL_TABLET | Freq: Every day | ORAL | 3 refills | Status: DC

## 2020-07-10 MED ORDER — FIASP FLEXTOUCH 100 UNIT/ML ~~LOC~~ SOPN
PEN_INJECTOR | SUBCUTANEOUS | 3 refills | Status: DC
Start: 2020-07-10 — End: 2021-04-29

## 2020-07-15 ENCOUNTER — Other Ambulatory Visit: Payer: Self-pay | Admitting: Pharmacist

## 2020-07-15 MED ORDER — REPATHA SURECLICK 140 MG/ML ~~LOC~~ SOAJ: 140.0000 mg | SUBCUTANEOUS | 3 refills | Status: DC

## 2020-07-15 MED ORDER — DAPAGLIFLOZIN PROPANEDIOL 10 MG PO TABS
10.0000 mg | ORAL_TABLET | Freq: Every day | ORAL | 3 refills | Status: DC
Start: 2020-07-15 — End: 2021-04-29

## 2020-07-17 ENCOUNTER — Ambulatory Visit: Payer: 59

## 2020-07-24 ENCOUNTER — Other Ambulatory Visit: Payer: Self-pay

## 2020-07-25 ENCOUNTER — Ambulatory Visit (INDEPENDENT_AMBULATORY_CARE_PROVIDER_SITE_OTHER): Payer: BC Managed Care – PPO

## 2020-07-25 DIAGNOSIS — Z23 Encounter for immunization: Secondary | ICD-10-CM

## 2020-08-01 ENCOUNTER — Encounter: Payer: Self-pay | Admitting: Internal Medicine

## 2020-08-01 NOTE — Progress Notes (Signed)
Outside notes received. Information abstracted. Notes sent to scan.  

## 2020-08-12 ENCOUNTER — Other Ambulatory Visit: Payer: Self-pay | Admitting: Endocrinology

## 2020-08-12 DIAGNOSIS — E1021 Type 1 diabetes mellitus with diabetic nephropathy: Secondary | ICD-10-CM

## 2020-08-27 DIAGNOSIS — H2513 Age-related nuclear cataract, bilateral: Secondary | ICD-10-CM | POA: Diagnosis not present

## 2020-08-27 DIAGNOSIS — E109 Type 1 diabetes mellitus without complications: Secondary | ICD-10-CM | POA: Diagnosis not present

## 2020-08-27 LAB — HM DIABETES EYE EXAM

## 2020-09-04 ENCOUNTER — Encounter: Payer: Self-pay | Admitting: Internal Medicine

## 2020-09-04 NOTE — Progress Notes (Signed)
Outside notes received. Information abstracted. Notes sent to scan.  

## 2020-09-07 DIAGNOSIS — Z20822 Contact with and (suspected) exposure to covid-19: Secondary | ICD-10-CM | POA: Diagnosis not present

## 2020-09-17 ENCOUNTER — Encounter: Payer: Self-pay | Admitting: Internal Medicine

## 2020-09-17 DIAGNOSIS — E119 Type 2 diabetes mellitus without complications: Secondary | ICD-10-CM

## 2020-09-17 MED ORDER — OZEMPIC (1 MG/DOSE) 4 MG/3ML ~~LOC~~ SOPN: 1.0000 mg | PEN_INJECTOR | SUBCUTANEOUS | 3 refills | Status: DC

## 2020-10-01 DIAGNOSIS — H903 Sensorineural hearing loss, bilateral: Secondary | ICD-10-CM | POA: Diagnosis not present

## 2020-10-16 ENCOUNTER — Encounter: Payer: Self-pay | Admitting: Internal Medicine

## 2020-10-16 ENCOUNTER — Ambulatory Visit: Payer: BC Managed Care – PPO | Admitting: Internal Medicine

## 2020-10-16 ENCOUNTER — Other Ambulatory Visit: Payer: Self-pay

## 2020-10-16 VITALS — BP 120/80 | HR 60 | Ht 70.0 in | Wt 201.6 lb

## 2020-10-16 DIAGNOSIS — E663 Overweight: Secondary | ICD-10-CM | POA: Diagnosis not present

## 2020-10-16 DIAGNOSIS — E119 Type 2 diabetes mellitus without complications: Secondary | ICD-10-CM

## 2020-10-16 DIAGNOSIS — E782 Mixed hyperlipidemia: Secondary | ICD-10-CM | POA: Diagnosis not present

## 2020-10-16 LAB — POCT GLYCOSYLATED HEMOGLOBIN (HGB A1C): Hemoglobin A1C: 6.1 % — AB (ref 4.0–5.6)

## 2020-10-16 NOTE — Progress Notes (Signed)
Patient ID: Luke West, male   DOB: [birth year], 65 y.o.   MRN: [id number]  This visit occurred during the SARS-CoV-2 public health emergency.  Safety protocols were in place, including screening questions prior to the visit, additional usage of staff PPE, and extensive cleaning of exam room while observing appropriate contact time as indicated for disinfecting solutions.   HPI: Luke West is a 65 y.o.-year-old male, returning for follow-up for DM2, dx in 2003, but diagnosed as DM1 in 08/2017, insulin-dependent since 08/2017, uncontrolled, with complications (CKD). Last visit was 4 months ago.  Interim history: He tells me that since last visit, he is doing better with injecting insulin at the right time and before every meal and also starting a bolus for coffee, which helps. He denies blurry vision, increased urination, weight changes, or GI symptoms. He continues to play golf.  Reviewed HbA1c levels: Lab Results  Component Value Date   HGBA1C 6.2 (A) 06/17/2020   HGBA1C 6.7 (A) 02/12/2020   HGBA1C 7.4 (A) 10/12/2019  04/06/2016: HbA1c calculated from fructosamine: 6.08%  Pt is on a regimen of: - Jardiance 25 mg in a.m. - Ozempic 0.5 mg weekly - started 05/2019 >> 1 mg weekly -increased 09/2019 - Toujeo 14 >> 22 units daily - Lyumjev 3-6 units before meals, 3 units before a snack and also 3 units before coffee We stopped Onglyza and glipizide in 05/2019 and switch to insulin Previously on [place] and Kombiglyze. He could not tolerate metformin ER so we stopped in 02/2018.  He checks his sugars more than 4 times a day with his freestyle libre CGM.   Previously:   Lowest sugar was 41 before dinner ...>> 60s >> high 50s; he has hypoglycemia awareness in the 39s. Highest sugar was 311 >> 250s >> .  Glucometer: One Touch Ultra Mini  Pt's meals are: - Breakfast: banana or egg + cheese + sausage - Lunch: sandwich, soup, chilli - Dinner: meat, veggies, strach - Snacks: 1+ -  nuts; crackers + PB, chips and salsa Drinks water, and sweet tea.  He was exercising 5 times a week at the gym but less since the coronavirus pandemic.  He  is now exercising with a Physiological scientist.  -+ Mild CKD, last BUN/creatinine:  Lab Results  Component Value Date   BUN 18 05/01/2020   CREATININE 1.23 05/01/2020   Lab Results  Component Value Date   GFRNONAA 72 12/12/2018   GFRNONAA 61 08/23/2018   GFRNONAA 71 03/20/2018   GFRNONAA 72.64 03/05/2010   GFRNONAA 67.46 09/11/2008   GFRNONAA 52 08/21/2007   GFRNONAA 75 08/22/2006   No MAU: Lab Results  Component Value Date   MICRALBCREAT 0.7 05/01/2020   MICRALBCREAT 1.6 05/01/2019   MICRALBCREAT 0.9 08/13/2016   MICRALBCREAT 0.8 07/17/2014   MICRALBCREAT 0.7 08/10/2013   MICRALBCREAT 0.8 03/02/2012   MICRALBCREAT 1.7 10/27/2011   MICRALBCREAT 1.1 07/26/2011   MICRALBCREAT 0.9 03/05/2010   MICRALBCREAT 2.8 10/10/2008   -+ HL; last set of lipids: Lab Results  Component Value Date   CHOL 116 05/01/2020   HDL 61.60 05/01/2020   LDLCALC 42 05/01/2020   LDLDIRECT 118.0 07/13/2017   TRIG 62.0 05/01/2020   CHOLHDL 2 05/01/2020  He restarted back on Lipitor in 04/2018.  He is currently on Lipitor 40 (dose decreased from 80 mg daily) and Repatha. - last eye exam was by Dr. Kathrin Penner (she is retiring): 08/2020: No DR - no numbness and tingling in his feet.  He had a  clean cath in 02/2018.  He sees Dr. Oval Linsey.  He also has a history of HTN.  He has Raynaud's phenomena.  ROS: Constitutional:no weight gain/no weight loss, no fatigue, no subjective hyperthermia, no subjective hypothermia Eyes: no blurry vision, no xerophthalmia ENT: no sore throat, no nodules palpated in neck, no dysphagia, no odynophagia, no hoarseness Cardiovascular: no CP/no SOB/no palpitations/no leg swelling Respiratory: no cough/no SOB/no wheezing Gastrointestinal: no N/no V/no D/no C/no acid reflux Musculoskeletal: no muscle aches/no joint  aches Skin: no rashes, no hair loss Neurological: no tremors/no numbness/no tingling/no dizziness  I reviewed pt's medications, allergies, PMH, social hx, family hx, and changes were documented in the history of present illness. Otherwise, unchanged from my initial visit note.  Past Medical History:  Diagnosis Date  . DM (diabetes mellitus) ([place])   . [name] syndrome 2008   Total bilirubin 1.4  . Hyperlipemia   . Raynaud phenomenon    Past Surgical History:  Procedure Laterality Date  . COLONOSCOPY  2005 & 2010   negative X 2; Dr Watt Climes (due 2020)  . LEFT HEART CATH AND CORONARY ANGIOGRAPHY N/A 03/21/2018   Procedure: LEFT HEART CATH AND CORONARY ANGIOGRAPHY;  Surgeon: Martinique, Peter M, MD;  Location: [facility];  Service: Cardiovascular;  Laterality: N/A;  . SHOULDER SURGERY     post dislocation sequellae  . TONSILLECTOMY AND ADENOIDECTOMY     History   Social History  . Marital Status: Married    Spouse Name: N/A  . Number of Children: 3   Occupational History  . Real estate development/construction   Social History Main Topics  . Smoking status: Former Smoker    Quit date: 06/28/1990  . Smokeless tobacco: Not on file     Comment: EHU:DJSHF 3-4 year . Smoked [phone] , up to 1-2 ppd  . Alcohol Use: 8.4 oz/week    14 Glasses of wine per week     Comment:  socially  . Drug Use: No   Current Outpatient Medications on File Prior to Visit  Medication Sig Dispense Refill  . aspirin 81 MG tablet Take 81 mg by mouth daily.     Marland Kitchen atorvastatin (LIPITOR) 40 MG tablet Take 1 tablet (40 mg total) by mouth daily. 90 tablet 3  . Continuous Blood Gluc Sensor (FREESTYLE LIBRE 14 DAY SENSOR) MISC APPLY SENSOR AS DIRECTED AND REPLACE EVERY 14 DAYS 2 each 11  . dapagliflozin propanediol (FARXIGA) 10 MG TABS tablet Take 1 tablet (10 mg total) by mouth daily before breakfast. 90 tablet 3  . Evolocumab (REPATHA SURECLICK) 850 MG/ML SOAJ Inject 140 mg into the skin every 14  (fourteen) days. 6 mL 3  . insulin aspart (FIASP FLEXTOUCH) 100 UNIT/ML FlexTouch Pen Inject under skin 3 to 6 units before meals, and 3 units before a snack (up to 25 units a day) 30 mL 3  . Insulin Pen Needle (BD PEN NEEDLE NANO 2ND GEN) 32G X 4 MM MISC Use 3 a day to inject insulin. 120 each 11  . Multiple Vitamin (MULTIVITAMIN) tablet Take 1 tablet by mouth daily.    . Semaglutide, 1 MG/DOSE, (OZEMPIC, 1 MG/DOSE,) 4 MG/3ML SOPN Inject 1 mg into the skin once a week. 9 mL 3  . TOUJEO MAX SOLOSTAR 300 UNIT/ML Solostar Pen ADMINISTER 22 UNITS UNDER THE SKIN DAILY BEFORE BREAKFAST 3 mL 2   No current facility-administered medications on file prior to visit.   No Known Allergies Family History  Problem Relation Age of Onset  .  Aneurysm Mother        cns  . Hyperlipidemia Mother   . Lung cancer Father        smoker  . Aneurysm Father        AAA  . Heart attack Sister 19  . Diabetes Brother        type 1  . Lung cancer Paternal Uncle        smoker  . Aneurysm Maternal Grandmother        cns;PRE 63 (mid 51s)  . Diabetes Maternal Grandfather        type 1  . Lung cancer Paternal Grandfather        smoker  . Aneurysm Paternal Grandmother        AAA   PE: BP 120/80 (BP Location: Left Arm, Patient Position: Sitting, Cuff Size: Normal)   Pulse 60   Ht 5\' 10"  (1.778 m)   Wt 201 lb 9.6 oz (91.4 kg)   SpO2 96%   BMI 28.93 kg/m  Body mass index is 28.93 kg/m. Wt Readings from Last 3 Encounters:  10/16/20 201 lb 9.6 oz (91.4 kg)  06/17/20 197 lb (89.4 kg)  05/01/20 191 lb (86.6 kg)   Constitutional: overweight, in NAD Eyes: PERRLA, EOMI, no exophthalmos ENT: moist mucous membranes, no thyromegaly, no cervical lymphadenopathy Cardiovascular: RRR, No MRG Respiratory: CTA B Gastrointestinal: abdomen soft, NT, ND, BS+ Musculoskeletal: no deformities, strength intact in all 4 Skin: moist, warm, no rashes Neurological: no tremor with outstretched hands, DTR normal in all  4  ASSESSMENT: 1. DM1, uncontrolled, with complications - CKD  We diagnosed insulin deficiency, consistent with a diagnosis of type 1 diabetes.  No detectable autoimmunity: Component     Latest Ref Rng & Units 09/02/2017  ZNT8 Antibodies     U/mL <15  Glutamic Acid Decarb Ab     <5 IU/mL <5  C-Peptide     0.80 - 3.85 ng/mL 0.51 (L)  Glucose, Plasma     65 - 99 mg/dL 141 (H)  Islet Cell Ab     Neg:<1:1 Negative   2. HL  3.  Overweight  PLAN:  1. Patient with long history of diabetes, diagnosed as type 1 diabetes in 2019.  Sugars initially improved significantly after adding basal insulin.  Last year we also added ultra rapid acting insulin before meals.  He is also on an SGLT2 inhibitor or GLP-1 receptor agonist.  At last visit, sugars were well controlled overnight but they were increasing after breakfast and then after dinner and also occasionally after lunch.  He was not taking Lyumjev consistently if sugars were lower than 130 before meals and we discussed about doing so.  We did not change his regimen otherwise.  HbA1c was lower, at 6.2%, at goal. CGM interpretation: -At today's visit, we reviewed his CGM downloads: It appears that 92% of values are in target range (goal >70%), while 4% are higher than 180 (goal <25%), and 4% are lower than 70 (goal <4%).  The calculated average blood sugar is 111.  The projected HbA1c for the next 3 months (GMI) is 6.0%. -Reviewing the CGM trends, sugars are better at all times of the day, especially improved after meals.  Sugars are increasing slightly in the morning, but if he does not eat, but upon questioning, he is drinking coffee and more recently started to bolus for this.  I advised him to continue the 3 units for coffee.  However, sugars appear to be lower at  night, even dropping in the 50s per CGM readings.  We will decrease his Toujeo dose for now. - I advised him to: Patient Instructions  Please continue: - Jardiance 25 mg in am -  Ozempic 1 mg weekly - Lyumjev 3-6 units before meals, and 3 units before a snack and coffee  Please decrease: - Toujeo 18-20 units daily  Please come back for a follow-up appointment in 4-6 months.  - we checked his HbA1c: 6.1% (lower, excellent) - advised to check sugars at different times of the day - 4x a day, rotating check times - advised for yearly eye exams >> he is UTD - return to clinic in 4 months  2. HL  - reviewed latest lipid panel reviewed latest lipid panel from 04/2020: All fractions at goal: Lab Results  Component Value Date   CHOL 116 05/01/2020   HDL 61.60 05/01/2020   LDLCALC 42 05/01/2020   LDLDIRECT 118.0 07/13/2017   TRIG 62.0 05/01/2020   CHOLHDL 2 05/01/2020  -Continues Lipitor 40 and also Repatha without side effects  3.  Overweight -He gained several pounds after addition of Lyumjev insulin -gained 4 lbs since last OV -continue SGLT 2 inhibitor and GLP-1 receptor agonist which should also help with weight loss  Philemon Kingdom, MD PhD [facility] Endocrinology

## 2020-10-16 NOTE — Patient Instructions (Addendum)
Please continue: - Jardiance 25 mg in am - Ozempic 1 mg weekly - Lyumjev 3-6 units before meals, and 3 units before a snack and coffee  Please decrease: - Toujeo 18-20 units daily  Please come back for a follow-up appointment in 4-6 months.

## 2020-10-31 ENCOUNTER — Other Ambulatory Visit: Payer: Self-pay | Admitting: Internal Medicine

## 2020-10-31 DIAGNOSIS — E119 Type 2 diabetes mellitus without complications: Secondary | ICD-10-CM

## 2020-12-31 ENCOUNTER — Telehealth: Payer: Self-pay | Admitting: Internal Medicine

## 2020-12-31 DIAGNOSIS — E1021 Type 1 diabetes mellitus with diabetic nephropathy: Secondary | ICD-10-CM

## 2020-12-31 NOTE — Telephone Encounter (Signed)
MEDICATION: Toujeo Max Solostar   PHARMACY:  [facility] [address]  HAS THE PATIENT CONTACTED THEIR PHARMACY? yes   IS THIS A 90 DAY SUPPLY : yes  IS PATIENT OUT OF MEDICATION:   IF NOT; HOW MUCH IS LEFT:   LAST APPOINTMENT DATE: @5 /11/2020  NEXT APPOINTMENT DATE:@11 /07/2020  DO WE HAVE YOUR PERMISSION TO LEAVE A DETAILED MESSAGE?: yes  OTHER COMMENTS:    **Let patient know to contact pharmacy at the end of the day to make sure medication is ready. **  ** Please notify patient to allow 48-72 hours to process**  **Encourage patient to contact the pharmacy for refills or they can request refills through [facility]**

## 2021-01-01 ENCOUNTER — Other Ambulatory Visit: Payer: Self-pay | Admitting: Internal Medicine

## 2021-01-05 MED ORDER — TOUJEO MAX SOLOSTAR 300 UNIT/ML ~~LOC~~ SOPN: PEN_INJECTOR | SUBCUTANEOUS | 2 refills | Status: DC

## 2021-01-05 NOTE — Telephone Encounter (Signed)
Rx sent to preferred pharmacy.

## 2021-02-09 ENCOUNTER — Other Ambulatory Visit: Payer: Self-pay | Admitting: Internal Medicine

## 2021-03-23 ENCOUNTER — Other Ambulatory Visit: Payer: Self-pay | Admitting: Internal Medicine

## 2021-03-23 DIAGNOSIS — E1021 Type 1 diabetes mellitus with diabetic nephropathy: Secondary | ICD-10-CM

## 2021-03-24 ENCOUNTER — Telehealth: Payer: Self-pay | Admitting: Internal Medicine

## 2021-03-24 NOTE — Telephone Encounter (Signed)
Patient called to request a new RX with change of dosage/quantity-Patient uses needles 5 x per day (Patient was using 1 needle per day before) for the following:  MEDICATION:  Insulin Pen Needle (BD PEN NEEDLE NANO 2ND GEN) 32G X 4 MM MISC  PHARMACY:   [facility] [phone] - [place], [place] - [address] Phone:  [phone]  Fax:  [phone]      HAS THE PATIENT CONTACTED [facility]?  Yes-Due to Increase from using 1 x per day to 5 x per day-Patient needs a new RX  IS THIS A 90 DAY SUPPLY : If possible  IS PATIENT OUT OF MEDICATION: Yes  IF NOT; HOW MUCH IS LEFT: N./A  LAST APPOINTMENT DATE: @4 /212022  NEXT APPOINTMENT DATE:@11 /07/2020  DO WE HAVE YOUR PERMISSION TO LEAVE A DETAILED MESSAGE?: Yes  OTHER COMMENTS:    **Let patient know to contact pharmacy at the end of the day to make sure medication is ready. **  ** Please notify patient to allow 48-72 hours to process**  **Encourage patient to contact the pharmacy for refills or they can request refills through [facility]**

## 2021-03-25 NOTE — Telephone Encounter (Signed)
Rx sent to preferred pharmacy.

## 2021-04-16 DIAGNOSIS — N5201 Erectile dysfunction due to arterial insufficiency: Secondary | ICD-10-CM | POA: Diagnosis not present

## 2021-04-16 DIAGNOSIS — R350 Frequency of micturition: Secondary | ICD-10-CM | POA: Diagnosis not present

## 2021-04-16 DIAGNOSIS — N401 Enlarged prostate with lower urinary tract symptoms: Secondary | ICD-10-CM | POA: Diagnosis not present

## 2021-04-20 DIAGNOSIS — Z20828 Contact with and (suspected) exposure to other viral communicable diseases: Secondary | ICD-10-CM | POA: Diagnosis not present

## 2021-04-29 ENCOUNTER — Ambulatory Visit: Payer: BC Managed Care – PPO | Admitting: Internal Medicine

## 2021-04-29 ENCOUNTER — Other Ambulatory Visit: Payer: Self-pay

## 2021-04-29 ENCOUNTER — Encounter: Payer: Self-pay | Admitting: Internal Medicine

## 2021-04-29 VITALS — BP 128/82 | HR 64 | Ht 70.0 in | Wt 197.8 lb

## 2021-04-29 DIAGNOSIS — E782 Mixed hyperlipidemia: Secondary | ICD-10-CM

## 2021-04-29 DIAGNOSIS — E119 Type 2 diabetes mellitus without complications: Secondary | ICD-10-CM | POA: Diagnosis not present

## 2021-04-29 DIAGNOSIS — E663 Overweight: Secondary | ICD-10-CM

## 2021-04-29 DIAGNOSIS — Z20828 Contact with and (suspected) exposure to other viral communicable diseases: Secondary | ICD-10-CM | POA: Diagnosis not present

## 2021-04-29 DIAGNOSIS — Z23 Encounter for immunization: Secondary | ICD-10-CM

## 2021-04-29 DIAGNOSIS — E1021 Type 1 diabetes mellitus with diabetic nephropathy: Secondary | ICD-10-CM | POA: Diagnosis not present

## 2021-04-29 LAB — POCT GLYCOSYLATED HEMOGLOBIN (HGB A1C): Hemoglobin A1C: 6 % — AB (ref 4.0–5.6)

## 2021-04-29 MED ORDER — DAPAGLIFLOZIN PROPANEDIOL 10 MG PO TABS: 10.0000 mg | ORAL_TABLET | Freq: Every day | ORAL | 3 refills | Status: DC

## 2021-04-29 MED ORDER — BD PEN NEEDLE NANO 2ND GEN 32G X 4 MM MISC: 3 refills | Status: DC

## 2021-04-29 MED ORDER — TOUJEO MAX SOLOSTAR 300 UNIT/ML ~~LOC~~ SOPN: PEN_INJECTOR | SUBCUTANEOUS | 3 refills | Status: DC

## 2021-04-29 MED ORDER — FIASP FLEXTOUCH 100 UNIT/ML ~~LOC~~ SOPN: PEN_INJECTOR | SUBCUTANEOUS | 3 refills | Status: DC

## 2021-04-29 MED ORDER — OZEMPIC (1 MG/DOSE) 4 MG/3ML ~~LOC~~ SOPN: 1.0000 mg | PEN_INJECTOR | SUBCUTANEOUS | 3 refills | Status: DC

## 2021-04-29 NOTE — Progress Notes (Signed)
Patient ID: Luke West, male   DOB: 01-Nov-1955, 65 y.o.   MRN: [id number]  This visit occurred during the SARS-CoV-2 public health emergency.  Safety protocols were in place, including screening questions prior to the visit, additional usage of staff PPE, and extensive cleaning of exam room while observing appropriate contact time as indicated for disinfecting solutions.   HPI: Luke West is a 65 y.o.-year-old male, returning for follow-up for DM2, dx in 2003, but diagnosed as DM1 in 08/2017, insulin-dependent since 08/2017, uncontrolled, with complications (CKD). Last visit was 6.5 months ago.  Interim history: No increased urination, blurry vision, nausea, chest pain. He has a physical trainer 2x a week in am >> sugars higher: 200s (takes 3-4 units after exercise).  Reviewed HbA1c levels: Lab Results  Component Value Date   HGBA1C 6.1 (A) 10/16/2020   HGBA1C 6.2 (A) 06/17/2020   HGBA1C 6.7 (A) 02/12/2020  04/06/2016: HbA1c calculated from fructosamine: 6.08%  Pt is on a regimen of: - Jardiance 25 mg in a.m. - Ozempic 0.5 mg weekly - started 05/2019 >> 1 mg weekly -increased 09/2019 - Toujeo 14 >> 22 >> 18-20 units daily - Lyumjev >> FiAsp 3-6 units before meals, 3 units before a snack and also 3 units before coffee We stopped Onglyza and glipizide in 05/2019 and switch to insulin Previously on Dyer and Kombiglyze. He could not tolerate metformin ER so we stopped in 02/2018.  He checks his sugars more than 4 times a day with his freestyle libre CGM:   Prev.:   Previously:   Lowest sugar was 41 before dinner ...>> high 50s; he has hypoglycemia awareness in the 27s. Highest sugar was 311 >> 250s >> 200s.  Glucometer: One Touch Ultra Mini  Pt's meals are: - Breakfast: banana or egg + cheese + sausage - Lunch: sandwich, soup, chilli - Dinner: meat, veggies, strach - Snacks: 1+ - nuts; crackers + PB, chips and salsa Drinks water, and sweet tea.  He was  exercising 5 times a week at the gym but less since the coronavirus pandemic.  He  is now exercising with a Physiological scientist.  -+ Mild CKD, last BUN/creatinine:  Lab Results  Component Value Date   BUN 18 05/01/2020   CREATININE 1.23 05/01/2020   Lab Results  Component Value Date   GFRNONAA 72 12/12/2018   GFRNONAA 61 08/23/2018   GFRNONAA 71 03/20/2018   GFRNONAA 72.64 03/05/2010   GFRNONAA 67.46 09/11/2008   GFRNONAA 52 08/21/2007   GFRNONAA 75 08/22/2006   No MAU: Lab Results  Component Value Date   MICRALBCREAT 0.7 05/01/2020   MICRALBCREAT 1.6 05/01/2019   MICRALBCREAT 0.9 08/13/2016   MICRALBCREAT 0.8 07/17/2014   MICRALBCREAT 0.7 08/10/2013   MICRALBCREAT 0.8 03/02/2012   MICRALBCREAT 1.7 10/27/2011   MICRALBCREAT 1.1 07/26/2011   MICRALBCREAT 0.9 03/05/2010   MICRALBCREAT 2.8 10/10/2008   -+ HL; last set of lipids: Lab Results  Component Value Date   CHOL 116 05/01/2020   HDL 61.60 05/01/2020   LDLCALC 42 05/01/2020   LDLDIRECT 118.0 07/13/2017   TRIG 62.0 05/01/2020   CHOLHDL 2 05/01/2020  He restarted back on Lipitor in 04/2018.  He is currently on Lipitor 40 (dose decreased from 80 mg daily) and Repatha.  - last eye exam was by Dr. Kathrin Penner (retired): 08/2020: No DR  - no numbness and tingling in his feet.  He had a clean cath in 02/2018.  He sees Dr. Oval Linsey. He also has a history of  HTN.  He has Raynaud's phenomena.  ROS: + see HPI  I reviewed pt's medications, allergies, PMH, social hx, family hx, and changes were documented in the history of present illness. Otherwise, unchanged from my initial visit note.  Past Medical History:  Diagnosis Date   DM (diabetes mellitus) ([place])    Gilbert syndrome 2008   Total bilirubin 1.4   Hyperlipemia    Raynaud phenomenon    Past Surgical History:  Procedure Laterality Date   COLONOSCOPY  2005 & 2010   negative X 2; Dr Watt Climes (due 2020)   LEFT HEART CATH AND CORONARY ANGIOGRAPHY N/A 03/21/2018    Procedure: LEFT HEART CATH AND CORONARY ANGIOGRAPHY;  Surgeon: Martinique, Peter M, MD;  Location: [facility];  Service: Cardiovascular;  Laterality: N/A;   SHOULDER SURGERY     post dislocation sequellae   TONSILLECTOMY AND ADENOIDECTOMY     History   Social History   Marital Status: Married    Spouse Name: N/A   Number of Children: 3   Occupational History   Real estate development/construction   Social History Main Topics   Smoking status: Former Smoker    Quit date: 06/28/1990   Smokeless tobacco: Not on file     Comment: STM:HDQQI 3-4 year . Smoked [phone] , up to 1-2 ppd   Alcohol Use: 8.4 oz/week    14 Glasses of wine per week     Comment:  socially   Drug Use: No   Current Outpatient Medications on File Prior to Visit  Medication Sig Dispense Refill   aspirin 81 MG tablet Take 81 mg by mouth daily.      atorvastatin (LIPITOR) 40 MG tablet Take 1 tablet (40 mg total) by mouth daily. 90 tablet 3   Continuous Blood Gluc Sensor (FREESTYLE LIBRE 14 DAY SENSOR) MISC APPLY SENSOR AS DIRECTED AND REPLACE EVERY 14 DAYS 6 each 3   dapagliflozin propanediol (FARXIGA) 10 MG TABS tablet Take 1 tablet (10 mg total) by mouth daily before breakfast. 90 tablet 3   Evolocumab (REPATHA SURECLICK) 194 MG/ML SOAJ Inject 140 mg into the skin every 14 (fourteen) days. 6 mL 3   insulin aspart (FIASP FLEXTOUCH) 100 UNIT/ML FlexTouch Pen Inject under skin 3 to 6 units before meals, and 3 units before a snack (up to 25 units a day) 30 mL 3   insulin glargine, 2 Unit Dial, (TOUJEO MAX SOLOSTAR) 300 UNIT/ML Solostar Pen ADMINISTER 22 UNITS UNDER THE SKIN DAILY BEFORE BREAKFAST 6 mL 2   Insulin Pen Needle (BD PEN NEEDLE NANO 2ND GEN) 32G X 4 MM MISC USE AS INSTRUCTED 5X DAILY 500 each 0   Multiple Vitamin (MULTIVITAMIN) tablet Take 1 tablet by mouth daily.     Semaglutide, 1 MG/DOSE, (OZEMPIC, 1 MG/DOSE,) 4 MG/3ML SOPN Inject 1 mg into the skin once a week. 9 mL 3   No current  facility-administered medications on file prior to visit.   No Known Allergies Family History  Problem Relation Age of Onset   Aneurysm Mother        cns   Hyperlipidemia Mother    Lung cancer Father        smoker   Aneurysm Father        AAA   Heart attack Sister 9   Diabetes Brother        type 1   Lung cancer Paternal Uncle        smoker   Aneurysm Maternal Grandmother  cns;PRE 26 (mid 5s)   Diabetes Maternal Grandfather        type 1   Lung cancer Paternal Grandfather        smoker   Aneurysm Paternal Grandmother        AAA   PE: BP 128/82 (BP Location: Right Arm, Patient Position: Sitting, Cuff Size: Normal)   Pulse 64   Ht 5\' 10"  (1.778 m)   Wt 197 lb 12.8 oz (89.7 kg)   SpO2 96%   BMI 28.38 kg/m  Body mass index is 28.38 kg/m. Wt Readings from Last 3 Encounters:  04/29/21 197 lb 12.8 oz (89.7 kg)  10/16/20 201 lb 9.6 oz (91.4 kg)  06/17/20 197 lb (89.4 kg)   Constitutional: overweight, in NAD Eyes: PERRLA, EOMI, no exophthalmos ENT: moist mucous membranes, no thyromegaly, no cervical lymphadenopathy Cardiovascular: RRR, No MRG Respiratory: CTA B Gastrointestinal: abdomen soft, NT, ND, BS+ Musculoskeletal: no deformities, strength intact in all 4 Skin: moist, warm, no rashes Neurological: no tremor with outstretched hands, DTR normal in all 4  ASSESSMENT: 1. DM1, uncontrolled, with complications - CKD  We diagnosed insulin deficiency, consistent with a diagnosis of type 1 diabetes.  No detectable autoimmunity: Component     Latest Ref Rng & Units 09/02/2017  ZNT8 Antibodies     U/mL <15  Glutamic Acid Decarb Ab     <5 IU/mL <5  C-Peptide     0.80 - 3.85 ng/mL 0.51 (L)  Glucose, Plasma     65 - 99 mg/dL 141 (H)  Islet Cell Ab     Neg:<1:1 Negative   2. HL  3.  Overweight  PLAN:  1. Patient with long history of diabetes, diagnosed as type 1 diabetes in 2019.  Sugars initially improved significantly after adding basal insulin but then  we had to add rapid acting insulin before meals as well.  He also continues on an SGLT2 user and GLP-1 receptor agonist.  At last visit, HbA1c was excellent, improved, to 6.1%.  At that time, sugars improved at all times of the day and especially after meals.  They were increasing slightly in the morning most likely due to drinking coffee and he just started to bolus for this.  I advised him to try to bolus 3 units before coffee and continue to do this before a snack, also.  We did decrease the dose of Toujeo as sugars appeared to be lower at night, even dropping in the 50s per review of CGM readings. CGM interpretation: -At today's visit, we reviewed his CGM downloads: It appears that 95% of values are in target range (goal >70%), while 4% are higher than 180 (goal <25%), and 1% are lower than 70 (goal <4%).  The calculated average blood sugar is 119.  The projected HbA1c for the next 3 months (GMI) is 6.2%. -Reviewing the CGM trends, it appears that his sugars are now excellently controlled, with only an occasional higher blood sugar especially after dinner and a few lower blood sugars during the night.  Upon questioning, he did not decrease the dose of Toujeo as advised at last visit so at this visit I advised him to try to use the 20 units more, but he can occasionally use 22 units if needed (if he has more sedentary days and also possibly during the holidays).  He is doing a great job taking Fiasp before meals and snacks.  Also, he finds that he needs to take 3 to 4 units of Fiasp before  exercising, since his sugars are usually increasing afterwards.  He is not dropping his sugars too low after exercise. -As of now, we can continue the rest of the regimen.  I refilled all of his diabetic medications - I advised him to: Patient Instructions  Please continue: - Jardiance 25 mg in am - Ozempic 1 mg weekly - FiAsp 3-6 units before meals, and 3 units before a snack and coffee  Try to decrease: - Toujeo  20-22 units daily  Please come back for a follow-up appointment in 6 months.  - we checked his HbA1c: 6.0% (excellent) - advised to check sugars at different times of the day - 4x a day, rotating check times - advised for yearly eye exams >> he is UTD - he has an appointment coming up with PCP later this month and will have labs checked then - return to clinic in 6 months  2. HL  -Reviewed latest lipid panel from 04/2020: All fractions at goal: Lab Results  Component Value Date   CHOL 116 05/01/2020   HDL 61.60 05/01/2020   LDLCALC 42 05/01/2020   LDLDIRECT 118.0 07/13/2017   TRIG 62.0 05/01/2020   CHOLHDL 2 05/01/2020  -He continues on Lipitor 40 and Repatha without side effects -He is due for another lipid panel -we will have this checked in few days at next appointment with PCP  3.  Overweight -He gained several pounds after addition of Lyumjev insulin -now on Fiasp -gained 4 lbs before last visit; lost 4 pounds since then. -continue SGLT 2 inhibitor and GLP-1 receptor agonist which should also help with weight loss  Philemon Kingdom, MD PhD [facility] Endocrinology

## 2021-04-29 NOTE — Patient Instructions (Addendum)
Please continue: - Jardiance 25 mg in am - Ozempic 1 mg weekly - FiAsp 3-6 units before meals, and 3 units before a snack and coffee  Try to decrease: - Toujeo 20-22 units daily  Please come back for a follow-up appointment in 6 months.

## 2021-05-04 ENCOUNTER — Encounter: Payer: BC Managed Care – PPO | Admitting: Internal Medicine

## 2021-05-10 NOTE — Progress Notes (Addendum)
Subjective:    Patient ID: Luke West, male    DOB: 11/27/1955, 65 y.o.   MRN: [id number]   This visit occurred during the SARS-CoV-2 public health emergency.  Safety protocols were in place, including screening questions prior to the visit, additional usage of staff PPE, and extensive cleaning of exam room while observing appropriate contact time as indicated for disinfecting solutions.   HPI He is here for a physical exam.   He has no concerns.  Overall he feels he is doing well.  He is no longer seeing cardiology and needs refills of his cholesterol medication.  Medications and allergies reviewed with patient and updated if appropriate.  Patient Active Problem List   Diagnosis Date Noted   Aortic atherosclerosis ([place]) 05/08/2020   Normal coronary arteries 07/27/2018   Near syncope 03/21/2018   Rhabdomyolysis 03/21/2018   Family history of abdominal aortic aneurysm (AAA) 07/11/2017   Psoriasis 03/15/2016   Rosacea 03/15/2016   Family history of premature CAD 06/12/2015   Diabetes ([place]) 04/15/2015   Nocturnal leg cramps 12/11/2013   Benign prostatic hyperplasia 03/05/2010   Mixed hyperlipidemia 09/21/2007   History of gout 05/11/2007   RAYNAUD'S SYNDROME 05/11/2007    Current Outpatient Medications on File Prior to Visit  Medication Sig Dispense Refill   atorvastatin (LIPITOR) 40 MG tablet Take 1 tablet (40 mg total) by mouth daily. 90 tablet 3   Continuous Blood Gluc Sensor (FREESTYLE LIBRE 14 DAY SENSOR) MISC APPLY SENSOR AS DIRECTED AND REPLACE EVERY 14 DAYS 6 each 3   dapagliflozin propanediol (FARXIGA) 10 MG TABS tablet Take 1 tablet (10 mg total) by mouth daily before breakfast. 90 tablet 3   Evolocumab (REPATHA SURECLICK) 517 MG/ML SOAJ Inject 140 mg into the skin every 14 (fourteen) days. 6 mL 3   insulin aspart (FIASP FLEXTOUCH) 100 UNIT/ML FlexTouch Pen Inject under skin 3 to 6 units before meals, and 3 units before a snack (up to 25 units a day) 30 mL 3    insulin glargine, 2 Unit Dial, (TOUJEO MAX SOLOSTAR) 300 UNIT/ML Solostar Pen ADMINISTER 22 UNITS UNDER THE SKIN DAILY BEFORE BREAKFAST 12 mL 3   Insulin Pen Needle (BD PEN NEEDLE NANO 2ND GEN) 32G X 4 MM MISC USE AS INSTRUCTED 5X DAILY 500 each 3   Multiple Vitamin (MULTIVITAMIN) tablet Take 1 tablet by mouth daily.     Semaglutide, 1 MG/DOSE, (OZEMPIC, 1 MG/DOSE,) 4 MG/3ML SOPN Inject 1 mg into the skin once a week. 9 mL 3   No current facility-administered medications on file prior to visit.    Past Medical History:  Diagnosis Date   DM (diabetes mellitus) ([place])    Gilbert syndrome 2008   Total bilirubin 1.4   Hyperlipemia    Raynaud phenomenon     Past Surgical History:  Procedure Laterality Date   COLONOSCOPY  2005 & 2010   negative X 2; Dr Watt Climes (due 2020)   LEFT HEART CATH AND CORONARY ANGIOGRAPHY N/A 03/21/2018   Procedure: LEFT HEART CATH AND CORONARY ANGIOGRAPHY;  Surgeon: Martinique, Peter M, MD;  Location: [facility];  Service: Cardiovascular;  Laterality: N/A;   SHOULDER SURGERY     post dislocation sequellae   TONSILLECTOMY AND ADENOIDECTOMY      Social History   Socioeconomic History   Marital status: Married    Spouse name: Not on file   Number of children: Not on file   Years of education: Not on file   Highest  education level: Not on file  Occupational History   Not on file  Tobacco Use   Smoking status: Former    Types: Cigarettes    Quit date: 06/28/1990    Years since quitting: 30.8   Smokeless tobacco: Never   Tobacco comments:    RSW:NIOEV 3-4 year . Smoked [phone] , up to 1-2 ppd  Substance and Sexual Activity   Alcohol use: Yes    Alcohol/week: 14.0 standard drinks    Types: 14 Glasses of wine per week    Comment:  socially   Drug use: No   Sexual activity: Not on file  Other Topics Concern   Not on file  Social History Narrative   Not on file   Social Determinants of Health   Financial Resource Strain: Not on file  Food  Insecurity: Not on file  Transportation Needs: Not on file  Physical Activity: Not on file  Stress: Not on file  Social Connections: Not on file    Family History  Problem Relation Age of Onset   Aneurysm Mother        cns   Hyperlipidemia Mother    Lung cancer Father        smoker   Aneurysm Father        AAA   Heart attack Sister 6   Diabetes Brother        type 1   Lung cancer Paternal Uncle        smoker   Aneurysm Maternal Grandmother        cns;PRE 17 (mid 54s)   Diabetes Maternal Grandfather        type 1   Lung cancer Paternal Grandfather        smoker   Aneurysm Paternal Grandmother        AAA    Review of Systems  Constitutional:  Negative for fever.  Eyes:  Negative for visual disturbance.  Respiratory:  Negative for cough, shortness of breath and wheezing.   Cardiovascular:  Negative for chest pain, palpitations and leg swelling.  Gastrointestinal:  Negative for abdominal pain, blood in stool, constipation, diarrhea and nausea.       No gerd  Genitourinary:  Negative for dysuria.  Musculoskeletal:  Negative for arthralgias and back pain.  Skin:  Negative for color change and rash.  Neurological:  Negative for light-headedness and headaches.  Psychiatric/Behavioral:  Negative for dysphoric mood. The patient is not nervous/anxious.       Objective:   Vitals:   05/11/21 1415  BP: 120/76  Pulse: 61  Temp: 98.1 F (36.7 C)  SpO2: 97%   Filed Weights   05/11/21 1415  Weight: 198 lb (89.8 kg)   Body mass index is 28.41 kg/m.  BP Readings from Last 3 Encounters:  05/11/21 120/76  04/29/21 128/82  10/16/20 120/80    Wt Readings from Last 3 Encounters:  05/11/21 198 lb (89.8 kg)  04/29/21 197 lb 12.8 oz (89.7 kg)  10/16/20 201 lb 9.6 oz (91.4 kg)    Depression screen [facility] 2/9 05/11/2021 05/01/2020 05/01/2019 07/11/2017 05/28/2013  Decreased Interest 0 0 0 0 0  Down, Depressed, Hopeless 0 0 0 0 0  PHQ - 2 Score 0 0 0 0 0  Altered sleeping 0 -  - - -  Tired, decreased energy 0 - - - -  Change in appetite 0 - - - -  Feeling bad or failure about yourself  0 - - - -  Trouble concentrating  0 - - - -  Moving slowly or fidgety/restless 0 - - - -  Suicidal thoughts 0 - - - -  PHQ-9 Score 0 - - - -    GAD 7 : Generalized Anxiety Score 05/11/2021  Nervous, Anxious, on Edge 0  Control/stop worrying 0  Worry too much - different things 0  Trouble relaxing 0  Restless 0  Easily annoyed or irritable 0  Afraid - awful might happen 0  Total GAD 7 Score 0       Physical Exam Constitutional: He appears well-developed and well-nourished. No distress.  HENT:  Head: Normocephalic and atraumatic.  Right Ear: External ear normal.  Left Ear: External ear normal.  Mouth/Throat: Oropharynx is clear and moist.  Normal ear canals and TM b/l  Eyes: Conjunctivae and EOM are normal.  Neck: Neck supple. No tracheal deviation present. No thyromegaly present.  No carotid bruit  Cardiovascular: Normal rate, regular rhythm, normal heart sounds and intact distal pulses.   No murmur heard. Pulmonary/Chest: Effort normal and breath sounds normal. No respiratory distress. He has no wheezes. He has no rales.  Abdominal: Soft. He exhibits no distension. There is no tenderness.  Genitourinary: deferred  Musculoskeletal: He exhibits no edema.  Lymphadenopathy:   He has no cervical adenopathy.  Skin: Skin is warm and dry. He is not diaphoretic.  Psychiatric: He has a normal mood and affect. His behavior is normal.         Assessment & Plan:   Physical exam: Screening blood work  ordered Exercise   personal trainer 3/week, home gym few times a week, walking Weight good for him  Substance abuse   none   Screened for depression using the PHQ 9 scale.  No evidence of depression.   Screened for anxiety using GAD7 Scale.  No evidence of anxiety.   Reviewed recommended immunizations.  Pneumonia-20 given today   Health Maintenance  Topic Date  Due   Pneumonia Vaccine 84+ Years old (2 - PCV) 08/13/2014   FOOT EXAM  04/30/2020   COVID-19 Vaccine (4 - Booster for Moderna series) 05/27/2021 (Originally 06/16/2020)   OPHTHALMOLOGY EXAM  08/27/2021   HEMOGLOBIN A1C  10/27/2021   URINE MICROALBUMIN  05/11/2022   TETANUS/TDAP  04/30/2029   COLONOSCOPY (Pts 45-42yrs Insurance coverage will need to be confirmed)  06/06/2029   INFLUENZA VACCINE  Completed   Hepatitis C Screening  Completed   HIV Screening  Completed   Zoster Vaccines- Shingrix  Completed   HPV VACCINES  Aged Out     See Problem List for Assessment and Plan of chronic medical problems.

## 2021-05-10 NOTE — Patient Instructions (Addendum)
° ° °Blood work was ordered.   ° ° °Medications changes include :   none ° °Your prescription(s) have been submitted to your pharmacy. Please take as directed and contact our office if you believe you are having problem(s) with the medication(s). ° ° °Please followup in 1 year ° ° °Health Maintenance, Male °Adopting a healthy lifestyle and getting preventive care are important in promoting health and wellness. Ask your health care provider about: °The right schedule for you to have regular tests and exams. °Things you can do on your own to prevent diseases and keep yourself healthy. °What should I know about diet, weight, and exercise? °Eat a healthy diet ° °Eat a diet that includes plenty of vegetables, fruits, low-fat dairy products, and lean protein. °Do not eat a lot of foods that are high in solid fats, added sugars, or sodium. °Maintain a healthy weight °Body mass index (BMI) is a measurement that can be used to identify possible weight problems. It estimates body fat based on height and weight. Your health care provider can help determine your BMI and help you achieve or maintain a healthy weight. °Get regular exercise °Get regular exercise. This is one of the most important things you can do for your health. Most adults should: °Exercise for at least 150 minutes each week. The exercise should increase your heart rate and make you sweat (moderate-intensity exercise). °Do strengthening exercises at least twice a week. This is in addition to the moderate-intensity exercise. °Spend less time sitting. Even light physical activity can be beneficial. °Watch cholesterol and blood lipids °Have your blood tested for lipids and cholesterol at 65 years of age, then have this test every 5 years. °You may need to have your cholesterol levels checked more often if: °Your lipid or cholesterol levels are high. °You are older than 65 years of age. °You are at high risk for heart disease. °What should I know about cancer  screening? °Many types of cancers can be detected early and may often be prevented. Depending on your health history and family history, you may need to have cancer screening at various ages. This may include screening for: °Colorectal cancer. °Prostate cancer. °Skin cancer. °Lung cancer. °What should I know about heart disease, diabetes, and high blood pressure? °Blood pressure and heart disease °High blood pressure causes heart disease and increases the risk of stroke. This is more likely to develop in people who have high blood pressure readings or are overweight. °Talk with your health care provider about your target blood pressure readings. °Have your blood pressure checked: °Every 3-5 years if you are 18-39 years of age. °Every year if you are 40 years old or older. °If you are between the ages of 65 and 75 and are a current or former smoker, ask your health care provider if you should have a one-time screening for abdominal aortic aneurysm (AAA). °Diabetes °Have regular diabetes screenings. This checks your fasting blood sugar level. Have the screening done: °Once every three years after age 45 if you are at a normal weight and have a low risk for diabetes. °More often and at a younger age if you are overweight or have a high risk for diabetes. °What should I know about preventing infection? °Hepatitis B °If you have a higher risk for hepatitis B, you should be screened for this virus. Talk with your health care provider to find out if you are at risk for hepatitis B infection. °Hepatitis C °Blood testing is recommended for: °  Everyone born from 1945 through 1965. °Anyone with known risk factors for hepatitis C. °Sexually transmitted infections (STIs) °You should be screened each year for STIs, including gonorrhea and chlamydia, if: °You are sexually active and are younger than 65 years of age. °You are older than 65 years of age and your health care provider tells you that you are at risk for this type of  infection. °Your sexual activity has changed since you were last screened, and you are at increased risk for chlamydia or gonorrhea. Ask your health care provider if you are at risk. °Ask your health care provider about whether you are at high risk for HIV. Your health care provider may recommend a prescription medicine to help prevent HIV infection. If you choose to take medicine to prevent HIV, you should first get tested for HIV. You should then be tested every 3 months for as long as you are taking the medicine. °Follow these instructions at home: °Alcohol use °Do not drink alcohol if your health care provider tells you not to drink. °If you drink alcohol: °Limit how much you have to 0-2 drinks a day. °Know how much alcohol is in your drink. In the [place] one drink equals one 12 oz bottle of beer (355 mL), one 5 oz glass of wine (148 mL), or one 1½ oz glass of hard liquor (44 mL). °Lifestyle °Do not use any products that contain nicotine or tobacco. These products include cigarettes, chewing tobacco, and vaping devices, such as e-cigarettes. If you need help quitting, ask your health care provider. °Do not use street drugs. °Do not share needles. °Ask your health care provider for help if you need support or information about quitting drugs. °General instructions °Schedule regular health, dental, and eye exams. °Stay current with your vaccines. °Tell your health care provider if: °You often feel depressed. °You have ever been abused or do not feel safe at home. °Summary °Adopting a healthy lifestyle and getting preventive care are important in promoting health and wellness. °Follow your health care provider's instructions about healthy diet, exercising, and getting tested or screened for diseases. °Follow your health care provider's instructions on monitoring your cholesterol and blood pressure. °This information is not intended to replace advice given to you by your health care provider. Make sure you discuss  any questions you have with your health care provider. °Document Revised: 11/03/2020 Document Reviewed: 11/03/2020 °Elsevier Patient Education © 2022 [employer] ° °

## 2021-05-11 ENCOUNTER — Other Ambulatory Visit: Payer: Self-pay

## 2021-05-11 ENCOUNTER — Ambulatory Visit (INDEPENDENT_AMBULATORY_CARE_PROVIDER_SITE_OTHER): Payer: BC Managed Care – PPO | Admitting: Internal Medicine

## 2021-05-11 ENCOUNTER — Encounter: Payer: Self-pay | Admitting: Internal Medicine

## 2021-05-11 VITALS — BP 120/76 | HR 61 | Temp 98.1°F | Ht 70.0 in | Wt 198.0 lb

## 2021-05-11 DIAGNOSIS — E1165 Type 2 diabetes mellitus with hyperglycemia: Secondary | ICD-10-CM | POA: Diagnosis not present

## 2021-05-11 DIAGNOSIS — Z1331 Encounter for screening for depression: Secondary | ICD-10-CM

## 2021-05-11 DIAGNOSIS — I700 Atherosclerosis of aorta: Secondary | ICD-10-CM | POA: Diagnosis not present

## 2021-05-11 DIAGNOSIS — Z794 Long term (current) use of insulin: Secondary | ICD-10-CM | POA: Diagnosis not present

## 2021-05-11 DIAGNOSIS — Z0000 Encounter for general adult medical examination without abnormal findings: Secondary | ICD-10-CM

## 2021-05-11 DIAGNOSIS — E782 Mixed hyperlipidemia: Secondary | ICD-10-CM | POA: Diagnosis not present

## 2021-05-11 LAB — LIPID PANEL
Cholesterol: 111 mg/dL (ref 0–200)
HDL: 72.5 mg/dL (ref >39.00)
LDL Cholesterol: 29 mg/dL (ref 0–99)
NonHDL: 38.82
Total CHOL/HDL Ratio: 2
Triglycerides: 49 mg/dL (ref 0.0–149.0)
VLDL: 9.8 mg/dL (ref 0.0–40.0)

## 2021-05-11 LAB — CBC WITH DIFFERENTIAL/PLATELET
Basophils Absolute: 0 10*3/uL (ref 0.0–0.1)
Basophils Relative: 0.6 % (ref 0.0–3.0)
Eosinophils Absolute: 0.1 10*3/uL (ref 0.0–0.7)
Eosinophils Relative: 1.7 % (ref 0.0–5.0)
HCT: 44.4 % (ref 39.0–52.0)
Hemoglobin: 14.8 g/dL (ref 13.0–17.0)
Lymphocytes Relative: 27.7 % (ref 12.0–46.0)
Lymphs Abs: 1.7 10*3/uL (ref 0.7–4.0)
MCHC: 33.4 g/dL (ref 30.0–36.0)
MCV: 97.5 fl (ref 78.0–100.0)
Monocytes Absolute: 0.4 10*3/uL (ref 0.1–1.0)
Monocytes Relative: 6.3 % (ref 3.0–12.0)
Neutro Abs: 4 10*3/uL (ref 1.4–7.7)
Neutrophils Relative %: 63.7 % (ref 43.0–77.0)
Platelets: 175 10*3/uL (ref 150.0–400.0)
RBC: 4.56 Mil/uL (ref 4.22–5.81)
RDW: 12.7 % (ref 11.5–15.5)
WBC: 6.3 10*3/uL (ref 4.0–10.5)

## 2021-05-11 LAB — COMPREHENSIVE METABOLIC PANEL
ALT: 21 U/L (ref 0–53)
AST: 23 U/L (ref 0–37)
Albumin: 4.7 g/dL (ref 3.5–5.2)
Alkaline Phosphatase: 63 U/L (ref 39–117)
BUN: 25 mg/dL — ABNORMAL HIGH (ref 6–23)
CO2: 29 mEq/L (ref 19–32)
Calcium: 9.7 mg/dL (ref 8.4–10.5)
Chloride: 100 mEq/L (ref 96–112)
Creatinine, Ser: 1.23 mg/dL (ref 0.40–1.50)
GFR: 61.79 mL/min (ref >60.00)
Glucose, Bld: 82 mg/dL (ref 70–99)
Potassium: 4.8 mEq/L (ref 3.5–5.1)
Sodium: 138 mEq/L (ref 135–145)
Total Bilirubin: 1.4 mg/dL — ABNORMAL HIGH (ref 0.2–1.2)
Total Protein: 7.1 g/dL (ref 6.0–8.3)

## 2021-05-11 LAB — MICROALBUMIN / CREATININE URINE RATIO
Creatinine,U: 228.2 mg/dL
Microalb Creat Ratio: 1.4 mg/g (ref 0.0–30.0)
Microalb, Ur: 3.2 mg/dL — ABNORMAL HIGH (ref 0.0–1.9)

## 2021-05-11 LAB — TSH: TSH: 1.64 u[IU]/mL (ref 0.35–5.50)

## 2021-05-11 NOTE — Assessment & Plan Note (Addendum)
Chronic Regular exercise and healthy diet encouraged Check lipid panel, CMP, TSH Continue atorvastatin 40 mg daily, Repatha 140 mg to 14 days

## 2021-05-11 NOTE — Assessment & Plan Note (Signed)
Chronic Check lipid panel, CMP Continue atorvastatin 40 mg daily, Repatha 140 mg every 2 weeks

## 2021-05-11 NOTE — Assessment & Plan Note (Signed)
Chronic Management per Dr. Cruzita Lederer Sugars are well controlled Check CMP, lipid panel, urine microalbumin

## 2021-05-18 DIAGNOSIS — Z20828 Contact with and (suspected) exposure to other viral communicable diseases: Secondary | ICD-10-CM | POA: Diagnosis not present

## 2021-06-04 DIAGNOSIS — D2262 Melanocytic nevi of left upper limb, including shoulder: Secondary | ICD-10-CM | POA: Diagnosis not present

## 2021-06-04 DIAGNOSIS — D485 Neoplasm of uncertain behavior of skin: Secondary | ICD-10-CM | POA: Diagnosis not present

## 2021-06-04 DIAGNOSIS — D2261 Melanocytic nevi of right upper limb, including shoulder: Secondary | ICD-10-CM | POA: Diagnosis not present

## 2021-06-04 DIAGNOSIS — L814 Other melanin hyperpigmentation: Secondary | ICD-10-CM | POA: Diagnosis not present

## 2021-06-04 DIAGNOSIS — L821 Other seborrheic keratosis: Secondary | ICD-10-CM | POA: Diagnosis not present

## 2021-06-04 DIAGNOSIS — D1801 Hemangioma of skin and subcutaneous tissue: Secondary | ICD-10-CM | POA: Diagnosis not present

## 2021-06-04 DIAGNOSIS — L570 Actinic keratosis: Secondary | ICD-10-CM | POA: Diagnosis not present

## 2021-06-20 ENCOUNTER — Other Ambulatory Visit: Payer: Self-pay | Admitting: Cardiovascular Disease

## 2021-06-23 ENCOUNTER — Other Ambulatory Visit: Payer: Self-pay | Admitting: Cardiovascular Disease

## 2021-06-25 ENCOUNTER — Other Ambulatory Visit: Payer: Self-pay | Admitting: Internal Medicine

## 2021-06-25 DIAGNOSIS — E1021 Type 1 diabetes mellitus with diabetic nephropathy: Secondary | ICD-10-CM

## 2021-07-10 ENCOUNTER — Other Ambulatory Visit: Payer: Self-pay

## 2021-07-10 ENCOUNTER — Ambulatory Visit (HOSPITAL_BASED_OUTPATIENT_CLINIC_OR_DEPARTMENT_OTHER): Payer: BC Managed Care – PPO | Admitting: Cardiovascular Disease

## 2021-07-10 ENCOUNTER — Encounter (HOSPITAL_BASED_OUTPATIENT_CLINIC_OR_DEPARTMENT_OTHER): Payer: Self-pay | Admitting: Cardiovascular Disease

## 2021-07-10 DIAGNOSIS — E7849 Other hyperlipidemia: Secondary | ICD-10-CM

## 2021-07-10 DIAGNOSIS — I700 Atherosclerosis of aorta: Secondary | ICD-10-CM

## 2021-07-10 MED ORDER — REPATHA SURECLICK 140 MG/ML ~~LOC~~ SOAJ: 140.0000 mg | SUBCUTANEOUS | 3 refills | Status: DC

## 2021-07-10 NOTE — Patient Instructions (Addendum)
Medication Instructions:  Your physician recommends that you continue on your current medications as directed. Please refer to the Current Medication list given to you today.   *If you need a refill on your cardiac medications before your next appointment, please call your pharmacy*  Lab Work: NONE   Testing/Procedures: NONE  Follow-Up: At [facility], you and your health needs are our priority.  As part of our continuing mission to provide you with exceptional heart care, we have created designated Provider Care Teams.  These Care Teams include your primary Cardiologist (physician) and Advanced Practice Providers (APPs -  Physician Assistants and Nurse Practitioners) who all work together to provide you with the care you need, when you need it.  We recommend signing up for the patient portal called "MyChart".  Sign up information is provided on this After Visit Summary.  MyChart is used to connect with patients for Virtual Visits (Telemedicine).  Patients are able to view lab/test results, encounter notes, upcoming appointments, etc.  Non-urgent messages can be sent to your provider as well.   To learn more about what you can do with MyChart, go to [email or web address].    Your next appointment:   2 year(s)  The format for your next appointment:   In Person  Provider:   Skeet Latch, MD

## 2021-07-10 NOTE — Assessment & Plan Note (Signed)
Lipids are well-controlled on atorvastatin and Repatha.

## 2021-07-10 NOTE — Progress Notes (Signed)
Cardiology Office Note   Date:  07/10/2021   ID:  Luke West, DOB 08/14/1955, MRN [id number]  PCP:  Binnie Rail, MD Luke West Cardiologist:   Skeet Latch, MD   No chief complaint on file.    History of Present Illness: Luke West is a 66 y.o. male with diabetes mellitus type 2 and familial hyperlipidemia here for follow up.  He was initially seen 06/2015 to establish care.  Mr. Escue has a family history of premature coronary artery disease.  His brother and sister both had heart attacks in their 75s.  His father had a AAA.  He was previously a patient of Dr. Mar Daring and underwent stress testing 10 years ago that was negative for ischemia.  At his initial appointment he was exercising regularly and had no symptoms.  He was already on aspirin and a statin.  We discussed the pros and cons of coronary calcium scoring and elected to continue with medical management, diet, and exercise.  In July 2019 he had an episode of near syncope.  He had a heavy workout with a personal trainer and felt nearly exhausted afterwards.  He did a lot of leg exercises.  The following day he went to play golf and felt very tired and short of breath.  He had difficulty walking up the hills and felt dizzy when he got to the top of the hill.  He has several physician friends who urged him to get an EKG performed.  When he was unable to do so he went to his local emergency department where EKG was unchanged from prior.  However, he was found to have rhabdomyolysis with CK greater than 2200.  He was given IV fluids and his statin was held.  He was subsequently seen in our office and underwent left heart catheterization 02/2018 that revealed normal coronaries.  BP was elevated in the office but better at home on repeat.  He was referred to the lipid clinic and started on Repatha 01/2019.  Overall he has been doing well.  He noticed that prior to taking Repatha his blood glucose was averaging in the 130s. He  worked with Dr. Cruzita Lederer and his blood sugars have been much better controlled.  At his last appointment he continued to exercise regularly and was doing well.  He has no exertional chest pain or shortness of breath.  His blood pressure has been well-controlled.  He follows up with endocrine regularly and his A1c has been good.  Overall he has no complaints.  He works out with a Clinical research associate twice per week.  He exercises 5 to 6 days/week.   Past Medical History:  Diagnosis Date   DM (diabetes mellitus) ([name])    Familial hyperlipidemia 09/21/2007   Normal coronary arteries 02/2018 Strong family history of premature coronary artery disease   Luke West syndrome 2008   Total bilirubin 1.4   Hyperlipemia    Raynaud phenomenon     Past Surgical History:  Procedure Laterality Date   COLONOSCOPY  2005 & 2010   negative X 2; Dr Watt Climes (due 2020)   LEFT HEART CATH AND CORONARY ANGIOGRAPHY N/A 03/21/2018   Procedure: LEFT HEART CATH AND CORONARY ANGIOGRAPHY;  Surgeon: Martinique, Peter M, MD;  Location: [facility];  Service: Cardiovascular;  Laterality: N/A;   SHOULDER SURGERY     post dislocation sequellae   TONSILLECTOMY AND ADENOIDECTOMY       Current Outpatient Medications  Medication Sig Dispense Refill  atorvastatin (LIPITOR) 40 MG tablet TAKE 1 TABLET(40 MG) BY MOUTH DAILY 90 tablet 3   Continuous Blood Gluc Sensor (FREESTYLE LIBRE 14 DAY SENSOR) MISC APPLY SENSOR AS DIRECTED AND REPLACE EVERY 14 DAYS 6 each 3   dapagliflozin propanediol (FARXIGA) 10 MG TABS tablet Take 1 tablet (10 mg total) by mouth daily before breakfast. 90 tablet 3   insulin aspart (FIASP FLEXTOUCH) 100 UNIT/ML FlexTouch Pen Inject under skin 3 to 6 units before meals, and 3 units before a snack (up to 25 units a day) 30 mL 3   insulin glargine, 2 Unit Dial, (TOUJEO MAX SOLOSTAR) 300 UNIT/ML Solostar Pen ADMINISTER 22 UNITS UNDER THE SKIN DAILY BEFORE BREAKFAST 12 mL 3   Insulin Pen Needle (BD PEN NEEDLE NANO 2ND GEN) 32G  X 4 MM MISC USE 5 TIMES A DAY 500 each 3   Multiple Vitamin (MULTIVITAMIN) tablet Take 1 tablet by mouth daily.     Semaglutide, 1 MG/DOSE, (OZEMPIC, 1 MG/DOSE,) 4 MG/3ML SOPN Inject 1 mg into the skin once a week. 9 mL 3   Evolocumab (REPATHA SURECLICK) 354 MG/ML SOAJ Inject 140 mg into the skin every 14 (fourteen) days. 6 mL 3   No current facility-administered medications for this visit.    Allergies:   Patient has no known allergies.    Social History:  The patient  reports that he quit smoking about 31 years ago. He has never used smokeless tobacco. He reports current alcohol use of about 14.0 standard drinks per week. He reports that he does not use drugs.   Family History:  The patient's family history includes Aneurysm in his father, maternal grandmother, mother, and paternal grandmother; Diabetes in his brother and maternal grandfather; Heart attack (age of onset: 58) in his sister; Hyperlipidemia in his mother; Lung cancer in his father, paternal grandfather, and paternal uncle.    ROS:  Please see the history of present illness.   Otherwise, review of systems are positive for none.   All other systems are reviewed and negative.    PHYSICAL EXAM: VS:  BP 120/74 (BP Location: Right Arm, Patient Position: Sitting, Cuff Size: Normal)    Pulse 60    Ht 5\' 10"  (1.778 m)    Wt 203 lb 3.2 oz (92.2 kg)    BMI 29.16 kg/m  , BMI Body mass index is 29.16 kg/m. GENERAL:  Well appearing HEENT: Pupils equal round and reactive, fundi not visualized, oral mucosa unremarkable NECK:  No jugular venous distention, waveform within normal limits, carotid upstroke brisk and symmetric, no bruits, no thyromegaly LUNGS:  Clear to auscultation bilaterally HEART:  RRR.  PMI not displaced or sustained,S1 and S2 within normal limits, no S3, no S4, no clicks, no rubs, no murmurs ABD:  Flat, positive bowel sounds normal in frequency in pitch, no bruits, no rebound, no guarding, no midline pulsatile mass, no  hepatomegaly, no splenomegaly EXT:  2 plus pulses throughout, no edema, no cyanosis no clubbing SKIN:  No rashes no nodules NEURO:  Cranial nerves II through XII grossly intact, motor grossly intact throughout PSYCH:  Cognitively intact, oriented to person place and time   EKG:  EKG is ordered today. The ekg ordered 07/10/15 demonstrates sinus bradycardia.  Rate 47 bpm.  LVH with repolarization abnormality.  06/20/18: Sinus bradycardia.  Sinus arrhythmia.  Rate 46 bpm.  First degree AV block.  LVH with repolarization abnormality.  03/08/19: Sinus bradycardia.  Rate 49 beats per minute.  First-degree AV block.  Early  repolarization abnormality. 03/17/20: Sinus bradycardia.  Rate 53 bpm.   07/10/2021: Sinus rhythm.  Rate 60 bpm.  First-degree AV block.   Recent Labs: 05/11/2021: ALT 21; BUN 25; Creatinine, Ser 1.23; Hemoglobin 14.8; Platelets 175.0; Potassium 4.8; Sodium 138; TSH 1.64    Lipid Panel    Component Value Date/Time   CHOL 111 05/11/2021 1449   CHOL 101 11/20/2019 0954   TRIG 49.0 05/11/2021 1449   HDL 72.50 05/11/2021 1449   HDL 62 11/20/2019 0954   CHOLHDL 2 05/11/2021 1449   VLDL 9.8 05/11/2021 1449   LDLCALC 29 05/11/2021 1449   LDLCALC 23 11/20/2019 0954   LDLDIRECT 118.0 07/13/2017 0846      Wt Readings from Last 3 Encounters:  07/10/21 203 lb 3.2 oz (92.2 kg)  05/11/21 198 lb (89.8 kg)  04/29/21 197 lb 12.8 oz (89.7 kg)      ASSESSMENT AND PLAN:  Aortic atherosclerosis (HCC) Lipids are well-controlled on atorvastatin and Repatha.  Familial hyperlipidemia Lipids are well-controlled on Repatha and atorvastatin.  Continue current regimen.  Continue with exercise and diet.    Current medicines are reviewed at length with the patient today.  The patient does not have concerns regarding medicines.  The following changes have been made:  no change  Labs/ tests ordered today include:   Orders Placed This Encounter  Procedures   EKG 12-Lead      Disposition:   FU with Justyn Boyson C. Oval Linsey, MD, [facility] in 2 years.     Signed, Liliana Brentlinger C. Oval Linsey, MD, [facility]  07/10/2021 1:06 PM    [facility]

## 2021-07-10 NOTE — Assessment & Plan Note (Signed)
Lipids are well-controlled on Repatha and atorvastatin.  Continue current regimen.  Continue with exercise and diet.

## 2021-08-31 DIAGNOSIS — E109 Type 1 diabetes mellitus without complications: Secondary | ICD-10-CM | POA: Diagnosis not present

## 2021-08-31 LAB — HM DIABETES EYE EXAM

## 2021-09-02 ENCOUNTER — Encounter: Payer: Self-pay | Admitting: Internal Medicine

## 2021-09-17 ENCOUNTER — Encounter: Payer: Self-pay | Admitting: Internal Medicine

## 2021-09-17 NOTE — Progress Notes (Signed)
duplicate

## 2021-10-04 ENCOUNTER — Other Ambulatory Visit: Payer: Self-pay | Admitting: Internal Medicine

## 2021-10-04 DIAGNOSIS — E119 Type 2 diabetes mellitus without complications: Secondary | ICD-10-CM

## 2021-10-19 NOTE — Progress Notes (Signed)
Patient ID: Luke West, male   DOB: Nov 06, 1955, 66 y.o.   MRN: [id number] ? ?This visit occurred during the SARS-CoV-2 public health emergency.  Safety protocols were in place, including screening questions prior to the visit, additional usage of staff PPE, and extensive cleaning of exam room while observing appropriate contact time as indicated for disinfecting solutions.  ? ?HPI: ?Luke West is a 66 y.o.-year-old male, returning for follow-up for DM2, dx in 2003, but diagnosed as DM1 in 08/2017, insulin-dependent since 08/2017, uncontrolled, with complications (CKD). Last visit was 6.5 months ago. ? ?Interim history: ?No increased urination, blurry vision, nausea, chest pain. ?He continues to workout with a physical trainer 2x a week in am >> sugars higher after exercise: 200s-still needs to take 3 to 4 units of Fiasp before exercise. ? ?Reviewed HbA1c levels: ?Lab Results  ?Component Value Date  ? HGBA1C 6.0 (A) 04/29/2021  ? HGBA1C 6.1 (A) 10/16/2020  ? HGBA1C 6.2 (A) 06/17/2020  ?04/06/2016: HbA1c calculated from fructosamine: 6.08% ? ?Pt is on a regimen of: ?- Jardiance 25 mg in a.m. ?- Ozempic 0.5 mg weekly - started 05/2019 >> 1 mg weekly -increased 09/2019 ?- Toujeo 14 >> 22 >> 18-20 >> 22 >> 20 units daily ?- Lyumjev >> FiAsp 3-6 units before meals, 3 units before a snack and also 3 units before coffee ?We stopped Onglyza and glipizide in 05/2019 and switch to insulin ?Previously on [employer]. ?He could not tolerate metformin ER so we stopped in 02/2018. ? ?He checks his sugars more than 4 times a day with his freestyle libre CGM: ? ? ?Previously: ? ? ?Prev.: ? ? ?Lowest sugar was 41 before dinner ...>> high 50s; he has hypoglycemia awareness in the 60s. ?Highest sugar was 311 >> 250s >> 200s. ? ?Glucometer: One Touch Ultra Mini ? ?Pt's meals are: ?- Breakfast: banana or egg + cheese + sausage ?- Lunch: sandwich, soup, chilli ?- Dinner: meat, veggies, strach ?- Snacks: 1+ - nuts;  crackers + PB, chips and salsa ?Drinks water, and sweet tea. ? ?He was exercising 5 times a week at the gym but less since the coronavirus pandemic.  He  is now exercising with a Physiological scientist. ? ?-+ Mild CKD, last BUN/creatinine:  ?Lab Results  ?Component Value Date  ? BUN 25 (H) 05/11/2021  ? CREATININE 1.23 05/11/2021  ? ?Lab Results  ?Component Value Date  ? GFRNONAA 72 12/12/2018  ? GFRNONAA 61 08/23/2018  ? GFRNONAA 71 03/20/2018  ? GFRNONAA 72.64 03/05/2010  ? GFRNONAA 67.46 09/11/2008  ? GFRNONAA 52 08/21/2007  ? GFRNONAA 75 08/22/2006  ? ?No MAU: ?Lab Results  ?Component Value Date  ? MICRALBCREAT 1.4 05/11/2021  ? MICRALBCREAT 0.7 05/01/2020  ? MICRALBCREAT 1.6 05/01/2019  ? MICRALBCREAT 0.9 08/13/2016  ? MICRALBCREAT 0.8 07/17/2014  ? MICRALBCREAT 0.7 08/10/2013  ? MICRALBCREAT 0.8 03/02/2012  ? MICRALBCREAT 1.7 10/27/2011  ? MICRALBCREAT 1.1 07/26/2011  ? MICRALBCREAT 0.9 03/05/2010  ? ?-+ HL; last set of lipids: ?Lab Results  ?Component Value Date  ? CHOL 111 05/11/2021  ? HDL 72.50 05/11/2021  ? [place] 29 05/11/2021  ? LDLDIRECT 118.0 07/13/2017  ? TRIG 49.0 05/11/2021  ? CHOLHDL 2 05/11/2021  ?He restarted back on Lipitor in 04/2018.  He is currently on Lipitor 40 (dose decreased from 80 mg daily) and Repatha. ? ?- last eye exam was: 08/2021: No DR ? ?- no numbness and tingling in his feet. ? ?He had a clean cath in  02/2018.  He sees Dr. Oval Linsey. ?He also has a history of HTN.  He has Raynaud's phenomena. ? ?ROS: ?+ see HPI ? ?I reviewed pt's medications, allergies, PMH, social hx, family hx, and changes were documented in the history of present illness. Otherwise, unchanged from my initial visit note. ? ?Past Medical History:  ?Diagnosis Date  ? DM (diabetes mellitus) ([place])   ? Familial hyperlipidemia 09/21/2007  ? Normal coronary arteries 02/2018 Strong family history of premature coronary artery disease  ? [name] syndrome 2008  ? Total bilirubin 1.4  ? Hyperlipemia   ? Raynaud phenomenon    ? ?Past Surgical History:  ?Procedure Laterality Date  ? COLONOSCOPY  2005 & 2010  ? negative X 2; Dr Watt Climes (due 2020)  ? LEFT HEART CATH AND CORONARY ANGIOGRAPHY N/A 03/21/2018  ? Procedure: LEFT HEART CATH AND CORONARY ANGIOGRAPHY;  Surgeon: Martinique, Peter M, MD;  Location: [facility];  Service: Cardiovascular;  Laterality: N/A;  ? SHOULDER SURGERY    ? post dislocation sequellae  ? TONSILLECTOMY AND ADENOIDECTOMY    ? ?History  ? ?Social History  ? Marital Status: Married  ?  Spouse Name: N/A  ? Number of Children: 3  ? 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 ?Social History Main Topics  ? Smoking status: Former Smoker  ?  Quit date: 06/28/1990  ? Smokeless tobacco: Not on file  ?   Comment: DPO:EUMPN 3-4 year . Smoked [phone] , up to 1-2 ppd  ? Alcohol Use: 8.4 oz/week  ?  14 Glasses of wine per week  ?   Comment:  socially  ? Drug Use: No  ? ?[facility] Outpatient Medications on File Prior to Visit  ?Medication Sig Dispense Refill  ? atorvastatin (LIPITOR) 40 MG tablet TAKE 1 TABLET(40 MG) BY MOUTH DAILY 90 tablet 3  ? Continuous Blood Gluc Sensor (FREESTYLE LIBRE 14 DAY SENSOR) MISC APPLY SENSOR AS DIRECTED AND REPLACE EVERY 14 DAYS 6 each 3  ? dapagliflozin propanediol (FARXIGA) 10 MG TABS tablet Take 1 tablet (10 mg total) by mouth daily before breakfast. 90 tablet 3  ? Evolocumab (REPATHA SURECLICK) 400 MG/ML SOAJ Inject 140 mg into the skin every 14 (fourteen) days. 6 mL 3  ? insulin aspart (FIASP FLEXTOUCH) 100 UNIT/ML FlexTouch Pen Inject under skin 3 to 6 units before meals, and 3 units before a snack (up to 25 units a day) 30 mL 3  ? insulin glargine, 2 Unit Dial, (TOUJEO MAX SOLOSTAR) 300 UNIT/ML Solostar Pen ADMINISTER 22 UNITS UNDER THE SKIN DAILY BEFORE BREAKFAST 12 mL 3  ? Insulin Pen Needle (BD PEN NEEDLE NANO 2ND GEN) 32G X 4 MM MISC USE 5 TIMES A DAY 500 each 3  ? Multiple Vitamin (MULTIVITAMIN) tablet Take 1 tablet by mouth daily.    ? OZEMPIC, 1 MG/DOSE, 4  MG/3ML SOPN INJECT '1MG'$  INTO THE SKIN ONCE A WEEK. 9 mL 2  ? ?No [facility] facility-administered medications on file prior to visit.  ? ?No Known Allergies ?Family History  ?Problem Relation Age of Onset  ? Aneurysm Mother   ?     cns  ? Hyperlipidemia Mother   ? Lung cancer Father   ?     smoker  ? Aneurysm Father   ?     AAA  ? Heart attack Sister 12  ? Diabetes Brother   ?     type 1  ? Lung cancer Paternal Uncle   ?  smoker  ? Aneurysm Maternal Grandmother   ?     cns;PRE 70 (mid 58s)  ? Diabetes Maternal Grandfather   ?     type 1  ? Lung cancer Paternal Grandfather   ?     smoker  ? Aneurysm Paternal Grandmother   ?     AAA  ? ?PE: ?BP 120/78 (BP Location: Left Arm, Patient Position: Sitting, Cuff Size: Normal)   Pulse (!) 58   Ht '5\' 10"'$  (1.778 m)   Wt 201 lb 12.8 oz (91.5 kg)   SpO2 97%   BMI 28.96 kg/m?   ?Wt Readings from Last 3 Encounters:  ?10/20/21 201 lb 12.8 oz (91.5 kg)  ?07/10/21 203 lb 3.2 oz (92.2 kg)  ?05/11/21 198 lb (89.8 kg)  ? ?Constitutional: overweight, in NAD ?Eyes: PERRLA, EOMI, no exophthalmos ?ENT: moist mucous membranes, no thyromegaly, no cervical lymphadenopathy ?Cardiovascular: RRR, No MRG ?Respiratory: CTA B ?Gastrointestinal: abdomen soft, NT, ND, BS+ ?Musculoskeletal: no deformities, strength intact in all 4 ?Skin: moist, warm, no rashes ?Neurological: no tremor with outstretched hands, DTR normal in all 4 ? ?ASSESSMENT: ?1. DM1, uncontrolled, with complications ?- CKD ? ?We diagnosed insulin deficiency, consistent with a diagnosis of type 1 diabetes.  No detectable autoimmunity: ?Component ?    Latest Ref Rng & Units 09/02/2017  ?ZNT8 Antibodies ?    U/mL <15  ?Glutamic Acid Decarb Ab ?    <5 IU/mL <5  ?C-Peptide ?    0.80 - 3.85 ng/mL 0.51 (L)  ?Glucose, Plasma ?    65 - 99 mg/dL 141 (H)  ?Islet Cell Ab ?    Neg:<1:1 Negative  ? ?2. HL ? ?3.  Overweight ? ?PLAN:  ?1. Patient with long history of controlled diabetes, diagnosed as type I in 2019.  Sugars initially improved  significantly after adding basal insulin but then we had to add rapid acting insulin before meals, as well.  He also continues on an SGLT2 inhibitor and GLP-1 receptor agonist.  At last visit, HbA1c was excellent, improved, a

## 2021-10-20 ENCOUNTER — Ambulatory Visit: Payer: BC Managed Care – PPO | Admitting: Internal Medicine

## 2021-10-20 ENCOUNTER — Encounter: Payer: Self-pay | Admitting: Internal Medicine

## 2021-10-20 DIAGNOSIS — E119 Type 2 diabetes mellitus without complications: Secondary | ICD-10-CM

## 2021-10-20 DIAGNOSIS — E1065 Type 1 diabetes mellitus with hyperglycemia: Secondary | ICD-10-CM | POA: Diagnosis not present

## 2021-10-20 DIAGNOSIS — E1021 Type 1 diabetes mellitus with diabetic nephropathy: Secondary | ICD-10-CM

## 2021-10-20 LAB — POCT GLYCOSYLATED HEMOGLOBIN (HGB A1C): Hemoglobin A1C: 5.9 % — AB (ref 4.0–5.6)

## 2021-10-20 MED ORDER — TOUJEO MAX SOLOSTAR 300 UNIT/ML ~~LOC~~ SOPN: PEN_INJECTOR | SUBCUTANEOUS | 3 refills | Status: DC

## 2021-10-20 MED ORDER — OZEMPIC (1 MG/DOSE) 4 MG/3ML ~~LOC~~ SOPN: PEN_INJECTOR | SUBCUTANEOUS | 3 refills | Status: DC

## 2021-10-20 NOTE — Patient Instructions (Signed)
Please continue: ?- Jardiance 25 mg in am ?- Ozempic 1 mg weekly ?- FiAsp 3-6 units before meals, and 3 units before a snack and coffee ? ?Please decrease: ?- Toujeo to 18 units daily ? ?Please come back for a follow-up appointment in 6 months. ?

## 2021-10-27 ENCOUNTER — Ambulatory Visit: Payer: BC Managed Care – PPO | Admitting: Internal Medicine

## 2022-01-19 ENCOUNTER — Other Ambulatory Visit: Payer: Self-pay | Admitting: Internal Medicine

## 2022-03-24 DIAGNOSIS — L821 Other seborrheic keratosis: Secondary | ICD-10-CM | POA: Diagnosis not present

## 2022-03-24 DIAGNOSIS — L820 Inflamed seborrheic keratosis: Secondary | ICD-10-CM | POA: Diagnosis not present

## 2022-03-24 DIAGNOSIS — L218 Other seborrheic dermatitis: Secondary | ICD-10-CM | POA: Diagnosis not present

## 2022-03-24 DIAGNOSIS — D225 Melanocytic nevi of trunk: Secondary | ICD-10-CM | POA: Diagnosis not present

## 2022-04-15 DIAGNOSIS — N5201 Erectile dysfunction due to arterial insufficiency: Secondary | ICD-10-CM | POA: Diagnosis not present

## 2022-04-15 DIAGNOSIS — R350 Frequency of micturition: Secondary | ICD-10-CM | POA: Diagnosis not present

## 2022-04-15 DIAGNOSIS — N401 Enlarged prostate with lower urinary tract symptoms: Secondary | ICD-10-CM | POA: Diagnosis not present

## 2022-04-22 ENCOUNTER — Ambulatory Visit: Payer: BC Managed Care – PPO | Admitting: Internal Medicine

## 2022-04-27 ENCOUNTER — Encounter: Payer: Self-pay | Admitting: Internal Medicine

## 2022-04-27 ENCOUNTER — Ambulatory Visit: Payer: BC Managed Care – PPO | Admitting: Internal Medicine

## 2022-04-27 VITALS — BP 128/84 | HR 52 | Ht 70.0 in | Wt 197.0 lb

## 2022-04-27 DIAGNOSIS — E1021 Type 1 diabetes mellitus with diabetic nephropathy: Secondary | ICD-10-CM | POA: Diagnosis not present

## 2022-04-27 DIAGNOSIS — E119 Type 2 diabetes mellitus without complications: Secondary | ICD-10-CM

## 2022-04-27 DIAGNOSIS — E782 Mixed hyperlipidemia: Secondary | ICD-10-CM | POA: Diagnosis not present

## 2022-04-27 DIAGNOSIS — E663 Overweight: Secondary | ICD-10-CM | POA: Diagnosis not present

## 2022-04-27 LAB — POCT GLYCOSYLATED HEMOGLOBIN (HGB A1C): Hemoglobin A1C: 6 % — AB (ref 4.0–5.6)

## 2022-04-27 MED ORDER — DAPAGLIFLOZIN PROPANEDIOL 10 MG PO TABS: 10.0000 mg | ORAL_TABLET | Freq: Every day | ORAL | 3 refills | Status: DC

## 2022-04-27 MED ORDER — FIASP FLEXTOUCH 100 UNIT/ML ~~LOC~~ SOPN: PEN_INJECTOR | SUBCUTANEOUS | 3 refills | Status: DC

## 2022-04-27 NOTE — Progress Notes (Signed)
Patient ID: Luke West, male   DOB: 1955-12-02, 66 y.o.   MRN: [id number]  HPI: Luke West is a 66 y.o.-year-old male, returning for follow-up for DM2, dx in 2003, but diagnosed as DM1 in 08/2017, insulin-dependent since 08/2017, uncontrolled, with complications (CKD). Last visit was 6 months ago.  Interim history: No increased urination, blurry vision, nausea, chest pain. He continues to workout with a physical trainer 2x a week in am (cardio and strength - HIIT) >> sugars were higher after exercise: 200s-needs to take 3 to 4 units of Fiasp before exercise, which works.  Reviewed HbA1c levels: Lab Results  Component Value Date   HGBA1C 5.9 (A) 10/20/2021   HGBA1C 6.0 (A) 04/29/2021   HGBA1C 6.1 (A) 10/16/2020  04/06/2016: HbA1c calculated from fructosamine: 6.08%  Pt is on a regimen of: - Jardiance 25 mg in a.m. >> Farxiga 10 mg in am - Ozempic 0.5 mg weekly - started 05/2019 >> 1 mg weekly -increased 09/2019 - Toujeo 14 >> 22 >> 18-20 >> 22 >> 20 >> 18 units daily - Lyumjev >> FiAsp 3-6 units before meals, 3 units before a snack and also 3 units before coffee We stopped Onglyza and glipizide in 05/2019 and switch to insulin Previously on [place] and Kombiglyze. He could not tolerate metformin ER so we stopped in 02/2018.  He checks his sugars more than 4 times a day with his freestyle libre CGM:  Previously:   Previously:   Lowest sugar was 41 before dinner ...>> high 50s; he has hypoglycemia awareness in the 61s. Highest sugar was 311 >> 250s >> 200s.  Glucometer: One Touch Ultra Mini  Pt's meals are: - Breakfast: banana or egg + cheese + sausage - Lunch: sandwich, soup, chilli - Dinner: meat, veggies, strach - Snacks: 1+ - nuts; crackers + PB, chips and salsa Drinks water, and sweet tea.  He was exercising 5 times a week at the gym but less since the coronavirus pandemic.  He  is now exercising with a Physiological scientist.  -+ Mild CKD, last BUN/creatinine:   Lab Results  Component Value Date   BUN 25 (H) 05/11/2021   CREATININE 1.23 05/11/2021   Lab Results  Component Value Date   GFRNONAA 72 12/12/2018   GFRNONAA 61 08/23/2018   GFRNONAA 71 03/20/2018   GFRNONAA 72.64 03/05/2010   GFRNONAA 67.46 09/11/2008   GFRNONAA 52 08/21/2007   GFRNONAA 75 08/22/2006   No MAU: Lab Results  Component Value Date   MICRALBCREAT 1.4 05/11/2021   MICRALBCREAT 0.7 05/01/2020   MICRALBCREAT 1.6 05/01/2019   MICRALBCREAT 0.9 08/13/2016   MICRALBCREAT 0.8 07/17/2014   MICRALBCREAT 0.7 08/10/2013   MICRALBCREAT 0.8 03/02/2012   MICRALBCREAT 1.7 10/27/2011   MICRALBCREAT 1.1 07/26/2011   MICRALBCREAT 0.9 03/05/2010   -+ HL; last set of lipids: Lab Results  Component Value Date   CHOL 111 05/11/2021   HDL 72.50 05/11/2021   LDLCALC 29 05/11/2021   LDLDIRECT 118.0 07/13/2017   TRIG 49.0 05/11/2021   CHOLHDL 2 05/11/2021  He restarted back on Lipitor in 04/2018.  He is currently on Lipitor 40 (dose decreased from 80 mg daily) and Repatha.  - last eye exam was: 08/2021: No DR  - no numbness and tingling in his feet.  He had a clean cath in 02/2018.  He sees Dr. Oval Linsey. He also has a history of HTN.  He has Raynaud's phenomena.  ROS: + see HPI  I reviewed pt's medications, allergies, PMH, social  hx, family hx, and changes were documented in the history of present illness. Otherwise, unchanged from my initial visit note.  Past Medical History:  Diagnosis Date   DM (diabetes mellitus) ([place])    Familial hyperlipidemia 09/21/2007   Normal coronary arteries 02/2018 Strong family history of premature coronary artery disease   [name] syndrome 2008   Total bilirubin 1.4   Hyperlipemia    Raynaud phenomenon    Past Surgical History:  Procedure Laterality Date   COLONOSCOPY  2005 & 2010   negative X 2; Dr Watt Climes (due 2020)   LEFT HEART CATH AND CORONARY ANGIOGRAPHY N/A 03/21/2018   Procedure: LEFT HEART CATH AND CORONARY ANGIOGRAPHY;   Surgeon: Martinique, Peter M, MD;  Location: [facility];  Service: Cardiovascular;  Laterality: N/A;   SHOULDER SURGERY     post dislocation sequellae   TONSILLECTOMY AND ADENOIDECTOMY     History   Social History   Marital Status: Married    Spouse Name: N/A   Number of Children: 3   Occupational History   Real estate development/construction   Social History Main Topics   Smoking status: Former Smoker    Quit date: 06/28/1990   Smokeless tobacco: Not on file     Comment: GBT:DVVOH 3-4 year . Smoked [phone] , up to 1-2 ppd   Alcohol Use: 8.4 oz/week    14 Glasses of wine per week     Comment:  socially   Drug Use: No   Current Outpatient Medications on File Prior to Visit  Medication Sig Dispense Refill   atorvastatin (LIPITOR) 40 MG tablet TAKE 1 TABLET(40 MG) BY MOUTH DAILY 90 tablet 3   Continuous Blood Gluc Sensor (FREESTYLE LIBRE 14 DAY SENSOR) MISC APPLY SENSOR AMD REPLACE EVERY 14 DAYS AS DIRECTED 6 each 3   dapagliflozin propanediol (FARXIGA) 10 MG TABS tablet Take 1 tablet (10 mg total) by mouth daily before breakfast. 90 tablet 3   Evolocumab (REPATHA SURECLICK) 269 MG/ML SOAJ Inject 140 mg into the skin every 14 (fourteen) days. 6 mL 3   insulin aspart (FIASP FLEXTOUCH) 100 UNIT/ML FlexTouch Pen Inject under skin 3 to 6 units before meals, and 3 units before a snack (up to 25 units a day) 30 mL 3   insulin glargine, 2 Unit Dial, (TOUJEO MAX SOLOSTAR) 300 UNIT/ML Solostar Pen ADMINISTER 18 UNITS UNDER THE SKIN DAILY BEFORE BREAKFAST 12 mL 3   Insulin Pen Needle (BD PEN NEEDLE NANO 2ND GEN) 32G X 4 MM MISC USE 5 TIMES A DAY 500 each 3   Multiple Vitamin (MULTIVITAMIN) tablet Take 1 tablet by mouth daily.     Semaglutide, 1 MG/DOSE, (OZEMPIC, 1 MG/DOSE,) 4 MG/3ML SOPN INJECT '1MG'$  INTO THE SKIN ONCE A WEEK. 9 mL 3   No current facility-administered medications on file prior to visit.   No Known Allergies Family History  Problem Relation Age of Onset   Aneurysm  Mother        cns   Hyperlipidemia Mother    Lung cancer Father        smoker   Aneurysm Father        AAA   Heart attack Sister 20   Diabetes Brother        type 1   Lung cancer Paternal Uncle        smoker   Aneurysm Maternal Grandmother        cns;PRE 34 (mid 83s)   Diabetes Maternal Grandfather  type 1   Lung cancer Paternal Grandfather        smoker   Aneurysm Paternal Grandmother        AAA   PE: BP 128/84 (BP Location: Right Arm, Patient Position: Sitting, Cuff Size: Normal)   Pulse (!) 52   Ht '5\' 10"'$  (1.778 m)   Wt 197 lb (89.4 kg)   SpO2 99%   BMI 28.27 kg/m   Wt Readings from Last 3 Encounters:  04/27/22 197 lb (89.4 kg)  10/20/21 201 lb 12.8 oz (91.5 kg)  07/10/21 203 lb 3.2 oz (92.2 kg)   Constitutional: overweight, in NAD Eyes:  EOMI, no exophthalmos ENT: no neck masses, no cervical lymphadenopathy Cardiovascular: RRR, No MRG Respiratory: CTA B Musculoskeletal: no deformities Skin:no rashes Neurological: no tremor with outstretched hands  ASSESSMENT: 1. DM1, uncontrolled, with complications - CKD  We diagnosed insulin deficiency, consistent with a diagnosis of type 1 diabetes.  No detectable autoimmunity: Component     Latest Ref Rng & Units 09/02/2017  ZNT8 Antibodies     U/mL <15  Glutamic Acid Decarb Ab     <5 IU/mL <5  C-Peptide     0.80 - 3.85 ng/mL 0.51 (L)  Glucose, Plasma     65 - 99 mg/dL 141 (H)  Islet Cell Ab     Neg:<1:1 Negative   2. HL  3.  Overweight  PLAN:  1. Patient with long history of controlled diabetes, diagnosed as type I in 2019.  Sugars initially improved significantly after adding basal insulin but then we had to add rapid acting insulin before meals, well.  He also continues on an SGLT2 inhibitor and GLP-1 receptor agonist.  At last visit, HbA1c was excellent, at 5.9%, improved.  I advised him to decrease the Toujeo dose.  We continued the rest of the regimen. CGM interpretation: -At today's visit, we  reviewed his CGM downloads: It appears that 88% of values are in target range (goal >70%), while 6% are higher than 180 (goal <25%), and 6% are lower than 70 (goal <4%).  The calculated average blood sugar is 118.  The projected HbA1c for the next 3 months (GMI) is 6.1%. -Reviewing the CGM trends, sugars appear to be fluctuating within the target range, with only occasional higher blood sugars after lunch and dinner, but also some lows throughout the day and at night.  Because of this, I did advise him to try to reduce the Toujeo to only 16 units daily.  He mentions that sugars are doing better after high intensity training if he takes approximately 3 units of Fiasp before exercising, as he previously had sugars in the 200s afterwards.  However, sugars are dropping if he plays golf -as he is active for several hours in a row.  We discussed that in such situations, to inject less insulin with a previous meal.  Decreasing the basal insulin should also help. -We did discuss about getting a CGM with alarms.  He still has the initial freestyle libre 14 CGM and I advised him that when he is close to running out of supplies to let me know so I can call in the freestyle libre 3.  He does not have Medicare, but [facility], so this may be covered. - I advised him to: Patient Instructions  Please continue: - Farxiga 10 mg in am - Ozempic 1 mg weekly - FiAsp 3-6 units before meals, and 3 units before a snack and coffee or before playing  golf  Decrease: - Toujeo 16 units daily  Please come back for a follow-up appointment in 6 months.  - we checked his HbA1c: 6.0% (slightly higher) - advised to check sugars at different times of the day - 4x a day, rotating check times - advised for yearly eye exams >> he is UTD - return to clinic in 6 months  2. HL  -Reviewed latest lipid panel from a year ago: Fractions at goal: Lab Results  Component Value Date   CHOL 111 05/11/2021   HDL 72.50 05/11/2021    LDLCALC 29 05/11/2021   LDLDIRECT 118.0 07/13/2017   TRIG 49.0 05/11/2021   CHOLHDL 2 05/11/2021  -He continues on Lipitor 40 mg daily and Repatha, without side effects  3.  Overweight -He gained 4 pounds before last visit -continue SGLT 2 inhibitor and GLP-1 receptor agonist which should also help with weight loss -He lost since 4 lbs since last visit  Philemon Kingdom, MD PhD [facility] End[facility]

## 2022-04-27 NOTE — Patient Instructions (Addendum)
Please continue: - Farxiga 10 mg in am - Ozempic 1 mg weekly - FiAsp 3-6 units before meals, and 3 units before a snack and coffee or before playing golf  Decrease: - Toujeo 16 units daily  Please come back for a follow-up appointment in 6 months.

## 2022-05-12 ENCOUNTER — Encounter: Payer: Self-pay | Admitting: Internal Medicine

## 2022-05-12 NOTE — Progress Notes (Unsigned)
Subjective:    Patient ID: Luke West, male    DOB: 1955-12-29, 66 y.o.   MRN: [id number]     HPI Luke West is here for a physical exam.   Overall he is doing well and has no concerns.   Medications and allergies reviewed with patient and updated if appropriate.  Current Outpatient Medications on File Prior to Visit  Medication Sig Dispense Refill   atorvastatin (LIPITOR) 40 MG tablet TAKE 1 TABLET(40 MG) BY MOUTH DAILY 90 tablet 3   Continuous Blood Gluc Sensor (FREESTYLE LIBRE 14 DAY SENSOR) MISC APPLY SENSOR AMD REPLACE EVERY 14 DAYS AS DIRECTED 6 each 3   dapagliflozin propanediol (FARXIGA) 10 MG TABS tablet Take 1 tablet (10 mg total) by mouth daily before breakfast. 90 tablet 3   Evolocumab (REPATHA SURECLICK) 616 MG/ML SOAJ Inject 140 mg into the skin every 14 (fourteen) days. 6 mL 3   insulin aspart (FIASP FLEXTOUCH) 100 UNIT/ML FlexTouch Pen Inject under skin 3 to 6 units before meals, and 3 units before a snack (up to 25 units a day) 15 mL 3   insulin glargine, 2 Unit Dial, (TOUJEO MAX SOLOSTAR) 300 UNIT/ML Solostar Pen ADMINISTER 18 UNITS UNDER THE SKIN DAILY BEFORE BREAKFAST 12 mL 3   Insulin Pen Needle (BD PEN NEEDLE NANO 2ND GEN) 32G X 4 MM MISC USE 5 TIMES A DAY 500 each 3   Multiple Vitamin (MULTIVITAMIN) tablet Take 1 tablet by mouth daily.     Semaglutide, 1 MG/DOSE, (OZEMPIC, 1 MG/DOSE,) 4 MG/3ML SOPN INJECT '1MG'$  INTO THE SKIN ONCE A WEEK. 9 mL 3   No current facility-administered medications on file prior to visit.    Review of Systems  Constitutional:  Negative for chills and fever.  Eyes:  Negative for visual disturbance.  Respiratory:  Negative for cough, shortness of breath and wheezing.   Cardiovascular:  Negative for chest pain, palpitations and leg swelling.  Gastrointestinal:  Negative for abdominal pain, blood in stool, constipation, diarrhea and nausea.       No gerd  Genitourinary:  Negative for difficulty urinating, dysuria and hematuria.   Musculoskeletal:  Negative for arthralgias and back pain.  Skin:  Negative for rash.  Neurological:  Positive for light-headedness (occ with standing up quickly). Negative for headaches.  Psychiatric/Behavioral:  Negative for dysphoric mood. The patient is not nervous/anxious.        Objective:   Vitals:   05/13/22 0843  BP: 118/72  Pulse: 78  Temp: 98.4 F (36.9 C)  SpO2: 98%   Filed Weights   05/13/22 0843  Weight: 195 lb (88.5 kg)   Body mass index is 27.98 kg/m.  BP Readings from Last 3 Encounters:  05/13/22 118/72  04/27/22 128/84  10/20/21 120/78    Wt Readings from Last 3 Encounters:  05/13/22 195 lb (88.5 kg)  04/27/22 197 lb (89.4 kg)  10/20/21 201 lb 12.8 oz (91.5 kg)      Physical Exam Constitutional: He appears well-developed and well-nourished. No distress.  HENT:  Head: Normocephalic and atraumatic.  Right Ear: External ear normal.  Left Ear: External ear normal.  Mouth/Throat: Oropharynx is clear and moist.  Normal ear canals and TM b/l  Eyes: Conjunctivae and EOM are normal.  Neck: Neck supple. No tracheal deviation present. No thyromegaly present.  No carotid bruit  Cardiovascular: Normal rate, regular rhythm, normal heart sounds and intact distal pulses.   No murmur heard. Pulmonary/Chest: Effort normal and breath sounds normal. No respiratory  distress. He has no wheezes. He has no rales.  Abdominal: Soft. He exhibits no distension. There is no tenderness.  Genitourinary: deferred  Musculoskeletal: He exhibits no edema.  Lymphadenopathy:   He has no cervical adenopathy.  Skin: Skin is warm and dry. He is not diaphoretic.  Psychiatric: He has a normal mood and affect. His behavior is normal.         Assessment & Plan:   Physical exam: Screening blood work  ordered Exercise   works out with trainers 2-3 days a week, works out at home - exercises 6 days a week Weight is good Substance abuse   none   Reviewed recommended  immunizations.  Has had all of his vaccines at [facility] will try to get the records   Health Maintenance  Topic Date Due   FOOT EXAM  04/30/2020   COVID-19 Vaccine (4 - Moderna series) 06/16/2020   Pneumonia Vaccine 64+ Years old (2 - PCV) 04/09/2021   INFLUENZA VACCINE  01/26/2022   Diabetic kidney evaluation - GFR measurement  05/11/2022   Diabetic kidney evaluation - Urine ACR  05/11/2022   OPHTHALMOLOGY EXAM  09/01/2022   HEMOGLOBIN A1C  10/26/2022   TETANUS/TDAP  04/30/2029   COLONOSCOPY (Pts 45-29yr Insurance coverage will need to be confirmed)  06/06/2029   Hepatitis C Screening  Completed   Zoster Vaccines- Shingrix  Completed   HPV VACCINES  Aged Out     See Problem List for Assessment and Plan of chronic medical problems.

## 2022-05-12 NOTE — Patient Instructions (Addendum)
Blood work was ordered.   The lab is on the first floor.    Medications changes include :   none     Return in about 1 year (around 05/14/2023) for Physical Exam.   Health Maintenance, Male Adopting a healthy lifestyle and getting preventive care are important in promoting health and wellness. Ask your health care provider about: The right schedule for you to have regular tests and exams. Things you can do on your own to prevent diseases and keep yourself healthy. What should I know about diet, weight, and exercise? Eat a healthy diet  Eat a diet that includes plenty of vegetables, fruits, low-fat dairy products, and lean protein. Do not eat a lot of foods that are high in solid fats, added sugars, or sodium. Maintain a healthy weight Body mass index (BMI) is a measurement that can be used to identify possible weight problems. It estimates body fat based on height and weight. Your health care provider can help determine your BMI and help you achieve or maintain a healthy weight. Get regular exercise Get regular exercise. This is one of the most important things you can do for your health. Most adults should: Exercise for at least 150 minutes each week. The exercise should increase your heart rate and make you sweat (moderate-intensity exercise). Do strengthening exercises at least twice a week. This is in addition to the moderate-intensity exercise. Spend less time sitting. Even light physical activity can be beneficial. Watch cholesterol and blood lipids Have your blood tested for lipids and cholesterol at 66 years of age, then have this test every 5 years. You may need to have your cholesterol levels checked more often if: Your lipid or cholesterol levels are high. You are older than 66 years of age. You are at high risk for heart disease. What should I know about cancer screening? Many types of cancers can be detected early and may often be prevented. Depending on your  health history and family history, you may need to have cancer screening at various ages. This may include screening for: Colorectal cancer. Prostate cancer. Skin cancer. Lung cancer. What should I know about heart disease, diabetes, and high blood pressure? Blood pressure and heart disease High blood pressure causes heart disease and increases the risk of stroke. This is more likely to develop in people who have high blood pressure readings or are overweight. Talk with your health care provider about your target blood pressure readings. Have your blood pressure checked: Every 3-5 years if you are 81-96 years of age. Every year if you are 29 years old or older. If you are between the ages of 67 and 42 and are a current or former smoker, ask your health care provider if you should have a one-time screening for abdominal aortic aneurysm (AAA). Diabetes Have regular diabetes screenings. This checks your fasting blood sugar level. Have the screening done: Once every three years after age 13 if you are at a normal weight and have a low risk for diabetes. More often and at a younger age if you are overweight or have a high risk for diabetes. What should I know about preventing infection? Hepatitis B If you have a higher risk for hepatitis B, you should be screened for this virus. Talk with your health care provider to find out if you are at risk for hepatitis B infection. Hepatitis C Blood testing is recommended for: Everyone born from [birth year] through 1965. Anyone with known risk  factors for hepatitis C. Sexually transmitted infections (STIs) You should be screened each year for STIs, including gonorrhea and chlamydia, if: You are sexually active and are younger than 66 years of age. You are older than 66 years of age and your health care provider tells you that you are at risk for this type of infection. Your sexual activity has changed since you were last screened, and you are at increased risk  for chlamydia or gonorrhea. Ask your health care provider if you are at risk. Ask your health care provider about whether you are at high risk for HIV. Your health care provider may recommend a prescription medicine to help prevent HIV infection. If you choose to take medicine to prevent HIV, you should first get tested for HIV. You should then be tested every 3 months for as long as you are taking the medicine. Follow these instructions at home: Alcohol use Do not drink alcohol if your health care provider tells you not to drink. If you drink alcohol: Limit how much you have to 0-2 drinks a day. Know how much alcohol is in your drink. In the [place] one drink equals one 12 oz bottle of beer (355 mL), one 5 oz glass of wine (148 mL), or one 1 oz glass of hard liquor (44 mL). Lifestyle Do not use any products that contain nicotine or tobacco. These products include cigarettes, chewing tobacco, and vaping devices, such as e-cigarettes. If you need help quitting, ask your health care provider. Do not use street drugs. Do not share needles. Ask your health care provider for help if you need support or information about quitting drugs. General instructions Schedule regular health, dental, and eye exams. Stay current with your vaccines. Tell your health care provider if: You often feel depressed. You have ever been abused or do not feel safe at home. Summary Adopting a healthy lifestyle and getting preventive care are important in promoting health and wellness. Follow your health care provider's instructions about healthy diet, exercising, and getting tested or screened for diseases. Follow your health care provider's instructions on monitoring your cholesterol and blood pressure. This information is not intended to replace advice given to you by your health care provider. Make sure you discuss any questions you have with your health care provider. Document Revised: 11/03/2020 Document Reviewed:  11/03/2020 Elsevier Patient Education  [place].

## 2022-05-13 ENCOUNTER — Ambulatory Visit (INDEPENDENT_AMBULATORY_CARE_PROVIDER_SITE_OTHER): Payer: BC Managed Care – PPO | Admitting: Internal Medicine

## 2022-05-13 VITALS — BP 118/72 | HR 78 | Temp 98.4°F | Ht 70.0 in | Wt 195.0 lb

## 2022-05-13 DIAGNOSIS — E7849 Other hyperlipidemia: Secondary | ICD-10-CM

## 2022-05-13 DIAGNOSIS — I700 Atherosclerosis of aorta: Secondary | ICD-10-CM | POA: Diagnosis not present

## 2022-05-13 DIAGNOSIS — Z794 Long term (current) use of insulin: Secondary | ICD-10-CM | POA: Diagnosis not present

## 2022-05-13 DIAGNOSIS — Z125 Encounter for screening for malignant neoplasm of prostate: Secondary | ICD-10-CM

## 2022-05-13 DIAGNOSIS — E1165 Type 2 diabetes mellitus with hyperglycemia: Secondary | ICD-10-CM | POA: Diagnosis not present

## 2022-05-13 DIAGNOSIS — Z0000 Encounter for general adult medical examination without abnormal findings: Secondary | ICD-10-CM | POA: Diagnosis not present

## 2022-05-13 LAB — COMPREHENSIVE METABOLIC PANEL
ALT: 17 U/L (ref 0–53)
AST: 23 U/L (ref 0–37)
Albumin: 4.3 g/dL (ref 3.5–5.2)
Alkaline Phosphatase: 67 U/L (ref 39–117)
BUN: 23 mg/dL (ref 6–23)
CO2: 30 mEq/L (ref 19–32)
Calcium: 9.3 mg/dL (ref 8.4–10.5)
Chloride: 105 mEq/L (ref 96–112)
Creatinine, Ser: 1.24 mg/dL (ref 0.40–1.50)
GFR: 60.76 mL/min (ref >60.00)
Glucose, Bld: 91 mg/dL (ref 70–99)
Potassium: 4.5 mEq/L (ref 3.5–5.1)
Sodium: 141 mEq/L (ref 135–145)
Total Bilirubin: 0.7 mg/dL (ref 0.2–1.2)
Total Protein: 6.4 g/dL (ref 6.0–8.3)

## 2022-05-13 LAB — CBC WITH DIFFERENTIAL/PLATELET
Basophils Absolute: 0 10*3/uL (ref 0.0–0.1)
Basophils Relative: 0.8 % (ref 0.0–3.0)
Eosinophils Absolute: 0.1 10*3/uL (ref 0.0–0.7)
Eosinophils Relative: 2.5 % (ref 0.0–5.0)
HCT: 44.6 % (ref 39.0–52.0)
Hemoglobin: 15.1 g/dL (ref 13.0–17.0)
Lymphocytes Relative: 34.4 % (ref 12.0–46.0)
Lymphs Abs: 1.8 10*3/uL (ref 0.7–4.0)
MCHC: 33.8 g/dL (ref 30.0–36.0)
MCV: 97.3 fl (ref 78.0–100.0)
Monocytes Absolute: 0.4 10*3/uL (ref 0.1–1.0)
Monocytes Relative: 7.3 % (ref 3.0–12.0)
Neutro Abs: 3 10*3/uL (ref 1.4–7.7)
Neutrophils Relative %: 55 % (ref 43.0–77.0)
Platelets: 173 10*3/uL (ref 150.0–400.0)
RBC: 4.58 Mil/uL (ref 4.22–5.81)
RDW: 12.6 % (ref 11.5–15.5)
WBC: 5.4 10*3/uL (ref 4.0–10.5)

## 2022-05-13 LAB — LIPID PANEL
Cholesterol: 102 mg/dL (ref 0–200)
HDL: 58.5 mg/dL (ref >39.00)
LDL Cholesterol: 35 mg/dL (ref 0–99)
NonHDL: 43.52
Total CHOL/HDL Ratio: 2
Triglycerides: 44 mg/dL (ref 0.0–149.0)
VLDL: 8.8 mg/dL (ref 0.0–40.0)

## 2022-05-13 LAB — MICROALBUMIN / CREATININE URINE RATIO
Creatinine,U: 125.7 mg/dL
Microalb Creat Ratio: 0.6 mg/g (ref 0.0–30.0)
Microalb, Ur: <0.7 mg/dL (ref 0.0–1.9)

## 2022-05-13 LAB — PSA: PSA: 0.95 ng/mL (ref 0.10–4.00)

## 2022-05-13 LAB — TSH: TSH: 2.19 u[IU]/mL (ref 0.35–5.50)

## 2022-05-13 NOTE — Assessment & Plan Note (Signed)
Chronic Continue atorvastatin 40 mg daily, Repatha 140 mg every 14 days CMP, lipid panel

## 2022-05-13 NOTE — Assessment & Plan Note (Signed)
Chronic Regular exercise and healthy diet encouraged Check lipid panel, CMP Continue atorvastatin 40 mg daily, Repatha 140 mg every 14 days

## 2022-05-13 NOTE — Assessment & Plan Note (Addendum)
Chronic Sugars are controlled Management per Dr. Cruzita Lederer Eye exams up-to-date Check urine microalbumin

## 2022-06-11 ENCOUNTER — Other Ambulatory Visit (HOSPITAL_BASED_OUTPATIENT_CLINIC_OR_DEPARTMENT_OTHER): Payer: Self-pay | Admitting: Cardiovascular Disease

## 2022-06-11 NOTE — Telephone Encounter (Signed)
Rx(s) sent to pharmacy electronically.  

## 2022-07-06 ENCOUNTER — Other Ambulatory Visit: Payer: Self-pay

## 2022-07-06 DIAGNOSIS — E119 Type 2 diabetes mellitus without complications: Secondary | ICD-10-CM

## 2022-07-06 DIAGNOSIS — E1021 Type 1 diabetes mellitus with diabetic nephropathy: Secondary | ICD-10-CM

## 2022-07-06 MED ORDER — OZEMPIC (1 MG/DOSE) 4 MG/3ML ~~LOC~~ SOPN: PEN_INJECTOR | SUBCUTANEOUS | 0 refills | Status: DC

## 2022-07-06 MED ORDER — TOUJEO MAX SOLOSTAR 300 UNIT/ML ~~LOC~~ SOPN: 22.0000 [IU] | PEN_INJECTOR | Freq: Every day | SUBCUTANEOUS | 0 refills | Status: DC

## 2022-07-06 NOTE — Addendum Note (Signed)
Addended by: Lauralyn Primes on: 07/06/2022 02:13 PM   Modules accepted: Orders

## 2022-07-09 ENCOUNTER — Other Ambulatory Visit (HOSPITAL_COMMUNITY): Payer: Self-pay

## 2022-07-12 ENCOUNTER — Telehealth: Payer: Self-pay

## 2022-07-12 ENCOUNTER — Other Ambulatory Visit (HOSPITAL_COMMUNITY): Payer: Self-pay

## 2022-07-12 NOTE — Telephone Encounter (Signed)
Pharmacy Patient Advocate Encounter   Received notification from [facility] that prior authorization for REPATHA 140 MG/ML INJ is needed.    PA submitted on 07/09/22 Key [id number] Status is pending  Karie Soda, [place] Patient Advocate Specialist Direct Number: [phone] Fax: [phone]

## 2022-07-13 NOTE — Telephone Encounter (Signed)
Pharmacy Patient Advocate Encounter  Prior Authorization for REPATHA 140 MG/ML INJ  has been approved.    Effective dates: 07/09/22 through 07/08/23  Karie Soda, [place] Patient Advocate Specialist Direct Number: [phone] Fax: [phone]

## 2022-08-14 ENCOUNTER — Other Ambulatory Visit: Payer: Self-pay | Admitting: Cardiovascular Disease

## 2022-08-18 ENCOUNTER — Other Ambulatory Visit: Payer: Self-pay

## 2022-08-18 DIAGNOSIS — E1021 Type 1 diabetes mellitus with diabetic nephropathy: Secondary | ICD-10-CM

## 2022-08-18 MED ORDER — BD PEN NEEDLE NANO 2ND GEN 32G X 4 MM MISC: 3 refills | Status: DC

## 2022-08-19 MED ORDER — TOUJEO MAX SOLOSTAR 300 UNIT/ML ~~LOC~~ SOPN: 18.0000 [IU] | PEN_INJECTOR | Freq: Every day | SUBCUTANEOUS | 1 refills | Status: DC

## 2022-08-19 NOTE — Addendum Note (Signed)
Addended by: Lauralyn Primes on: 08/19/2022 05:20 PM   Modules accepted: Orders

## 2022-08-27 ENCOUNTER — Other Ambulatory Visit (HOSPITAL_BASED_OUTPATIENT_CLINIC_OR_DEPARTMENT_OTHER): Payer: Self-pay | Admitting: Cardiovascular Disease

## 2022-08-27 ENCOUNTER — Other Ambulatory Visit: Payer: Self-pay

## 2022-08-27 NOTE — Telephone Encounter (Signed)
Pt called requesting a refill be sent to his new pharmacy, [facility] on [address]. Pt stated that [facility] is not able to transfer his medication. I called pt pts pharmacy and they confirmed medication is not transferable. Please address. Thank you.

## 2022-08-27 NOTE — Telephone Encounter (Signed)
Rx request sent to pharmacy.  

## 2022-08-31 ENCOUNTER — Telehealth (HOSPITAL_BASED_OUTPATIENT_CLINIC_OR_DEPARTMENT_OTHER): Payer: Self-pay | Admitting: Cardiovascular Disease

## 2022-08-31 MED ORDER — REPATHA SURECLICK 140 MG/ML ~~LOC~~ SOAJ: 1.0000 mL | SUBCUTANEOUS | 3 refills | Status: DC

## 2022-08-31 NOTE — Telephone Encounter (Signed)
Rx request sent to pharmacy.  

## 2022-08-31 NOTE — Telephone Encounter (Signed)
*  STAT* If patient is at the pharmacy, call can be transferred to refill team.   1. Which medications need to be refilled? (please list name of each medication and dose if known)   Evolocumab (REPATHA SURECLICK) [id number] MG/ML SOAJ   2. Which pharmacy/location (including street and city if local pharmacy) is medication to be sent to?  [place], [place]   3. Do they need a 30 day or 90 day supply?   90 day  Patient states he is completely out of this medication. Patient states the [facility] at [place] has closed and his prescription was not transferred to the [place] at [address].

## 2022-09-06 DIAGNOSIS — E119 Type 2 diabetes mellitus without complications: Secondary | ICD-10-CM | POA: Diagnosis not present

## 2022-09-06 DIAGNOSIS — H52203 Unspecified astigmatism, bilateral: Secondary | ICD-10-CM | POA: Diagnosis not present

## 2022-09-06 DIAGNOSIS — H2513 Age-related nuclear cataract, bilateral: Secondary | ICD-10-CM | POA: Diagnosis not present

## 2022-09-06 LAB — HM DIABETES EYE EXAM

## 2022-09-10 ENCOUNTER — Encounter: Payer: Self-pay | Admitting: Internal Medicine

## 2022-09-10 NOTE — Progress Notes (Signed)
Outside notes received. Information abstracted. Notes sent to scan.  

## 2022-10-22 IMAGING — US US AORTA
1 series · 11 of 11 positions shown · non-contrast
Comparison: None.

CLINICAL DATA: Pulsatile abdominal mass. Positive family history of
abdominal aortic aneurysm.

EXAM:
ULTRASOUND OF ABDOMINAL AORTA
TECHNIQUE: Ultrasound examination of the abdominal aorta and proximal common
iliac arteries was performed to evaluate for aneurysm. Additional
color and Doppler images of the distal aorta were obtained to
document patency.

[Series 1: us aorta · 0.28mm/px · 11 of 11 slices shown]
[im 1/11]
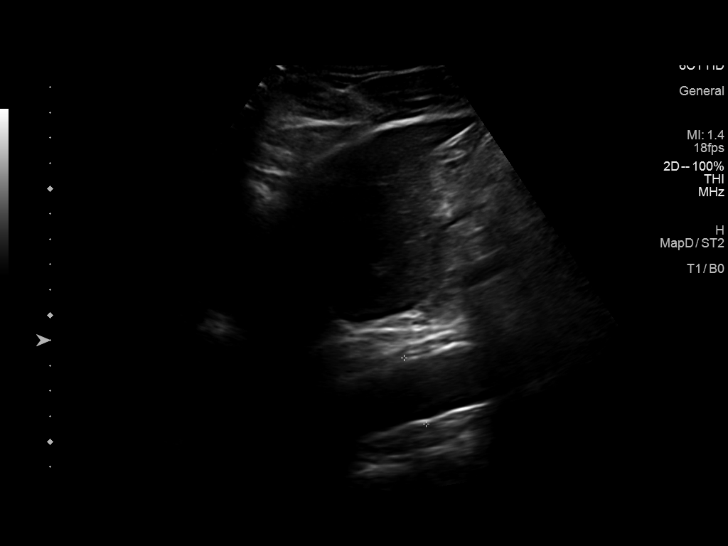
[im 2/11]
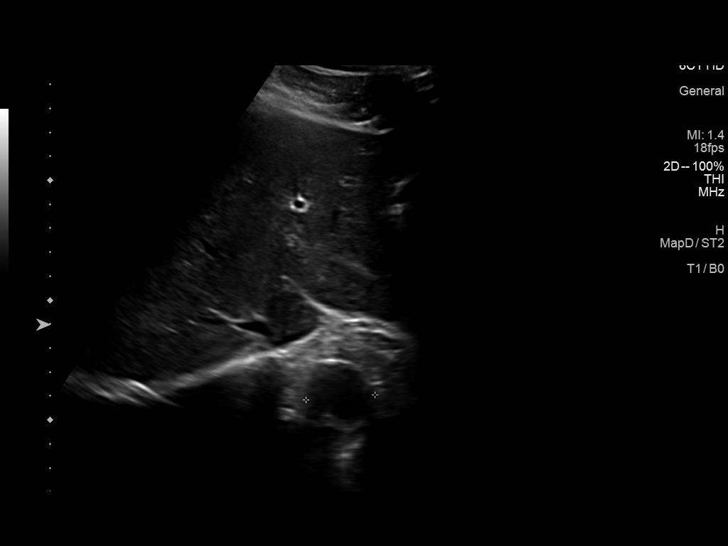
[im 3/11]
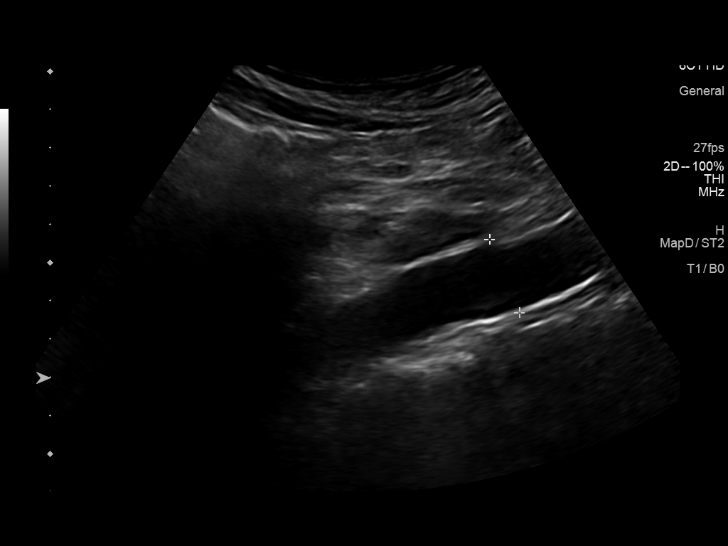
[im 4/11]
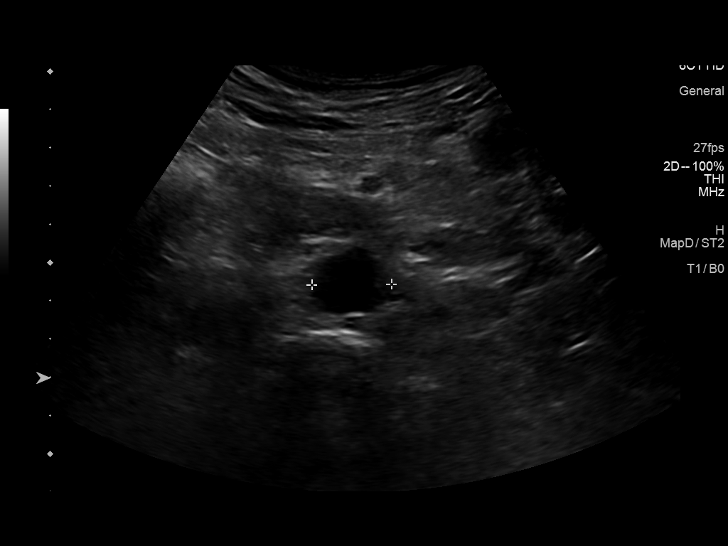
[im 5/11]
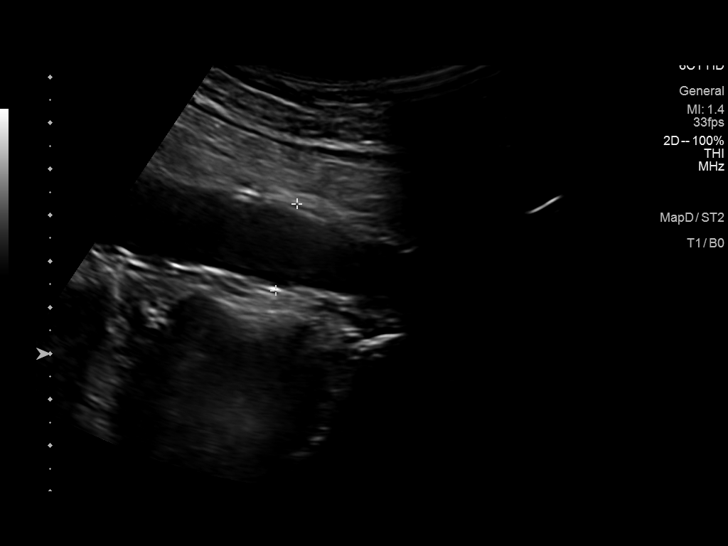
[im 6/11]
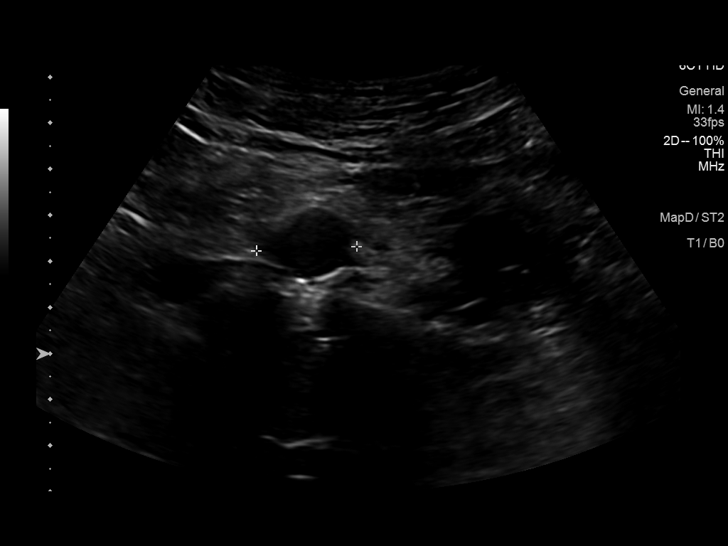
[im 7/11]
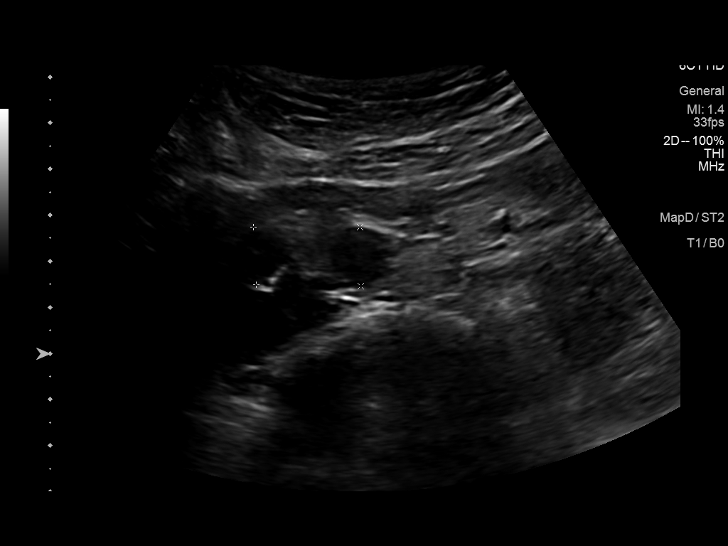
[im 8/11]
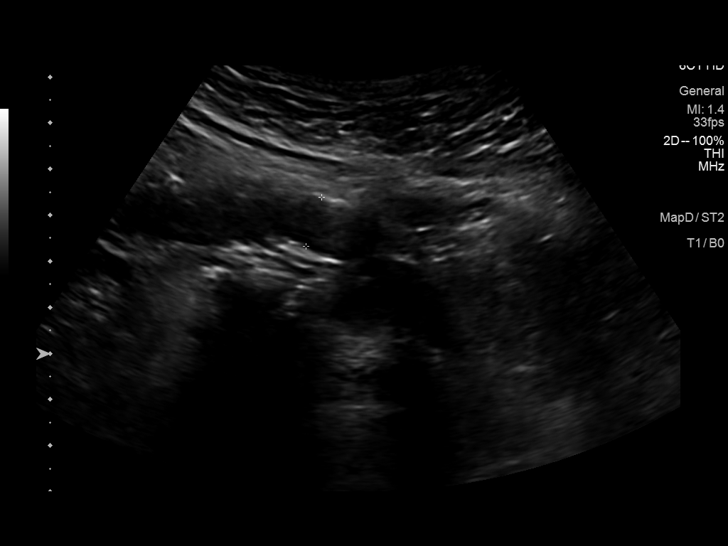
[im 9/11]
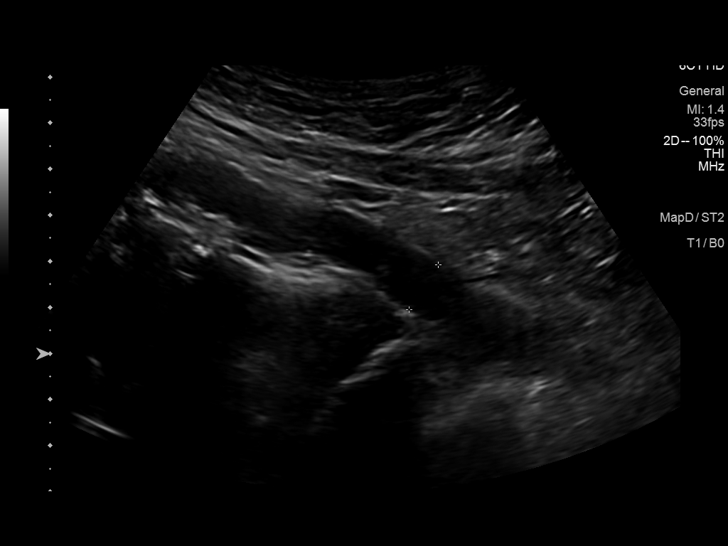
[im 10/11]
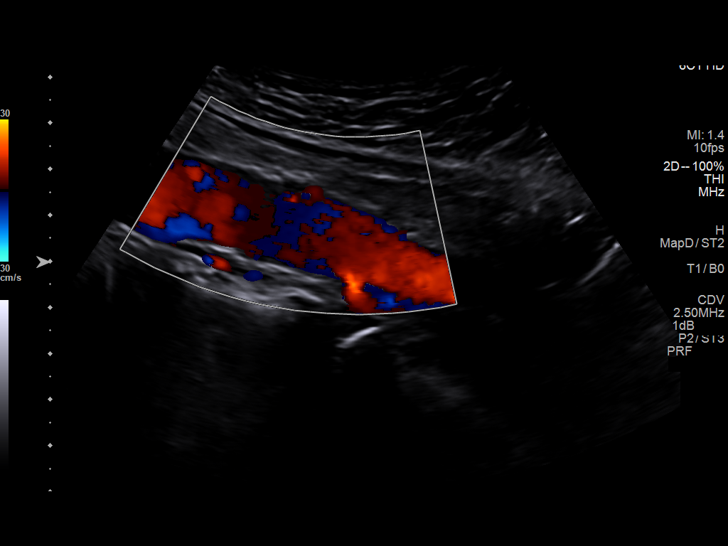
[im 11/11]
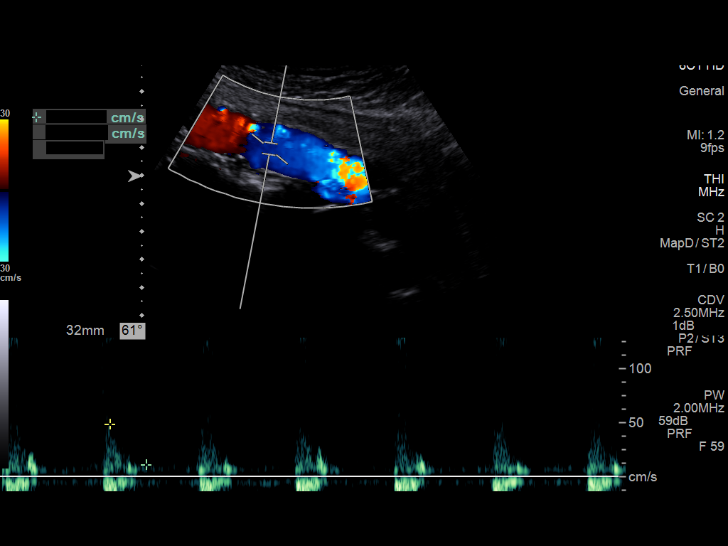

[11 of 11 positions shown; findings below may reference images not displayed]

FINDINGS: Aortic atherosclerotic plaque noted.

Abdominal aortic measurements as follows:

Proximal:  2.8 cm

Mid:  2.1 cm

Distal:  1.9 cm
Patent: Yes, peak systolic velocity is 48.6 cm/s

Right common iliac artery: 1.1 cm

Left common iliac artery: 1.2 cm
IMPRESSION: Aortic atherosclerosis.  No evidence of abdominal aortic aneurysm.

## 2022-10-26 ENCOUNTER — Encounter: Payer: Self-pay | Admitting: Internal Medicine

## 2022-10-26 ENCOUNTER — Ambulatory Visit: Payer: BC Managed Care – PPO | Admitting: Internal Medicine

## 2022-10-26 VITALS — BP 130/78 | HR 74 | Ht 70.0 in | Wt 200.6 lb

## 2022-10-26 DIAGNOSIS — E1022 Type 1 diabetes mellitus with diabetic chronic kidney disease: Secondary | ICD-10-CM

## 2022-10-26 DIAGNOSIS — E663 Overweight: Secondary | ICD-10-CM

## 2022-10-26 DIAGNOSIS — N189 Chronic kidney disease, unspecified: Secondary | ICD-10-CM | POA: Diagnosis not present

## 2022-10-26 DIAGNOSIS — E782 Mixed hyperlipidemia: Secondary | ICD-10-CM

## 2022-10-26 DIAGNOSIS — E119 Type 2 diabetes mellitus without complications: Secondary | ICD-10-CM

## 2022-10-26 DIAGNOSIS — E1021 Type 1 diabetes mellitus with diabetic nephropathy: Secondary | ICD-10-CM

## 2022-10-26 LAB — POCT GLYCOSYLATED HEMOGLOBIN (HGB A1C): Hemoglobin A1C: 5.8 % — AB (ref 4.0–5.6)

## 2022-10-26 MED ORDER — FREESTYLE LIBRE 3 SENSOR MISC: 1.0000 | 3 refills | Status: DC

## 2022-10-26 MED ORDER — OZEMPIC (1 MG/DOSE) 4 MG/3ML ~~LOC~~ SOPN
PEN_INJECTOR | SUBCUTANEOUS | 3 refills | Status: DC
Start: 2022-10-26 — End: 2022-11-30

## 2022-10-26 MED ORDER — FIASP FLEXTOUCH 100 UNIT/ML ~~LOC~~ SOPN: PEN_INJECTOR | SUBCUTANEOUS | 3 refills | Status: DC

## 2022-10-26 MED ORDER — TOUJEO MAX SOLOSTAR 300 UNIT/ML ~~LOC~~ SOPN: 18.0000 [IU] | PEN_INJECTOR | Freq: Every day | SUBCUTANEOUS | 3 refills | Status: DC

## 2022-10-26 NOTE — Progress Notes (Signed)
Patient ID: Luke West, male   DOB: 1955-11-20, 67 y.o.   MRN: [id number]  HPI: Luke West is a 67 y.o.-year-old male, returning for follow-up for DM2, dx in 2003, but diagnosed as DM1 in 08/2017, insulin-dependent since 08/2017, uncontrolled, with complications (CKD). Last visit was 6 months ago.  Interim history: No increased urination, blurry vision, nausea, chest pain.  Reviewed HbA1c levels: Lab Results  Component Value Date   HGBA1C 6.0 (A) 04/27/2022   HGBA1C 5.9 (A) 10/20/2021   HGBA1C 6.0 (A) 04/29/2021  04/06/2016: HbA1c calculated from fructosamine: 6.08%  Pt is on a regimen of: - Jardiance 25 mg in a.m. >> Farxiga 10 mg in am - Ozempic 0.5 mg weekly - started 05/2019 >> 1 mg weekly -increased 09/2019 - Toujeo 14 >> 22 >> 18-20 >> 22 >> 20 >> 18 >> 16 units daily - Lyumjev >> FiAsp 3-6 units before meals, 3 units before a snack, coffee, exercise Workout (cardio and strength - HIIT) >> sugars were higher after exercise: 200s-needs to take 3 to 4 units of Fiasp before exercise, which works. We stopped Onglyza and glipizide in 05/2019 and switch to insulin Previously on JanuMet and Kombiglyze. He could not tolerate metformin ER so we stopped in 02/2018.  He checks his sugars more than 4 times a day with his freestyle libre CGM:  Previously:  Previously:   Lowest sugar was 41 before dinner ...>> high 50s >> 50s; he has hypoglycemia awareness in the 41s. Highest sugar was 311 >> 250s >> 200s >> 300.  Glucometer: One Touch Ultra Mini  Pt's meals are: - Breakfast: banana or egg + cheese + sausage - Lunch: sandwich, soup, chilli - Dinner: meat, veggies, strach - Snacks: 1+ - nuts; crackers + PB, chips and salsa Drinks water, and sweet tea.  He was exercising 5 times a week at the gym but less since the coronavirus pandemic.  He  is now exercising with a Systems analyst.  -+ Mild CKD, last BUN/creatinine:  Lab Results  Component Value Date   BUN 23  05/13/2022   CREATININE 1.24 05/13/2022   Lab Results  Component Value Date   GFRNONAA 72 12/12/2018   GFRNONAA 61 08/23/2018   GFRNONAA 71 03/20/2018   GFRNONAA 72.64 03/05/2010   GFRNONAA 67.46 09/11/2008   GFRNONAA 52 08/21/2007   GFRNONAA 75 08/22/2006   No MAU: Lab Results  Component Value Date   MICRALBCREAT 0.6 05/13/2022   MICRALBCREAT 1.4 05/11/2021   MICRALBCREAT 0.7 05/01/2020   MICRALBCREAT 1.6 05/01/2019   MICRALBCREAT 0.9 08/13/2016   MICRALBCREAT 0.8 07/17/2014   MICRALBCREAT 0.7 08/10/2013   MICRALBCREAT 0.8 03/02/2012   MICRALBCREAT 1.7 10/27/2011   MICRALBCREAT 1.1 07/26/2011   -+ HL; last set of lipids: Lab Results  Component Value Date   CHOL 102 05/13/2022   HDL 58.50 05/13/2022   LDLCALC 35 05/13/2022   LDLDIRECT 118.0 07/13/2017   TRIG 44.0 05/13/2022   CHOLHDL 2 05/13/2022  He restarted back on Lipitor in 04/2018.  He is currently on Lipitor 40 (dose decreased from 80 mg daily) and Repatha.  - last eye exam was:  09/06/2022: No DR  - no numbness and tingling in his feet.  He had a clean cath in 02/2018.  He sees Dr. Duke Salvia. He also has a history of HTN.  He has Raynaud's phenomena.  ROS: + see HPI  I reviewed pt's medications, allergies, PMH, social hx, family hx, and changes were documented in the history of  present illness. Otherwise, unchanged from my initial visit note.  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   Total bilirubin 1.4   Hyperlipemia    Raynaud phenomenon    Past Surgical History:  Procedure Laterality Date   COLONOSCOPY  2005 & 2010   negative X 2; Dr Ewing Schlein (due 2020)   LEFT HEART CATH AND CORONARY ANGIOGRAPHY N/A 03/21/2018   Procedure: LEFT HEART CATH AND CORONARY ANGIOGRAPHY;  Surgeon: [place], Peter M, MD;  Location: [facility] INVASIVE  CV LAB;  Service: Cardiovascular;  Laterality: N/A;   SHOULDER SURGERY     post dislocation sequellae   TONSILLECTOMY AND ADENOIDECTOMY     History   Social History   Marital Status: Married    Spouse Name: N/A   Number of Children: 3   Occupational History   Real estate development/construction   Social History Main Topics   Smoking status: Former Smoker    Quit date: 06/28/1990   Smokeless tobacco: Not on file     Comment: HYQ:MVHQI 3-4 year . Smoked [phone] , up to 1-2 ppd   Alcohol Use: 8.4 oz/week    14 Glasses of wine per week     Comment:  socially   Drug Use: No   Current Outpatient Medications on File Prior to Visit  Medication Sig Dispense Refill   atorvastatin (LIPITOR) 40 MG tablet TAKE 1 TABLET(40 MG) BY MOUTH DAILY 90 tablet 3   Continuous Blood Gluc Sensor (FREESTYLE LIBRE 14 DAY SENSOR) MISC APPLY SENSOR AMD REPLACE EVERY 14 DAYS AS DIRECTED 6 each 3   dapagliflozin propanediol (FARXIGA) 10 MG TABS tablet Take 1 tablet (10 mg total) by mouth daily before breakfast. 90 tablet 3   Evolocumab (REPATHA SURECLICK) 140 MG/ML SOAJ Inject 140 mg into the skin every 14 (fourteen) days. 6 mL 3   insulin aspart (FIASP FLEXTOUCH) 100 UNIT/ML FlexTouch Pen Inject under skin 3 to 6 units before meals, and 3 units before a snack (up to 25 units a day) 15 mL 3   insulin glargine, 2 Unit Dial, (TOUJEO MAX SOLOSTAR) 300 UNIT/ML Solostar Pen Inject 18 Units into the skin daily. 6 mL 1   Insulin Pen Needle (BD PEN NEEDLE NANO 2ND GEN) 32G X 4 MM MISC USE 5 TIMES A DAY 500 each 3   Multiple Vitamin (MULTIVITAMIN) tablet Take 1 tablet by mouth daily.     Semaglutide, 1 MG/DOSE, (OZEMPIC, 1 MG/DOSE,) 4 MG/3ML SOPN INJECT 1MG  INTO THE SKIN ONCE A WEEK. 3 mL 0   No current facility-administered medications on file prior to visit.   No Known Allergies Family History  Problem Relation Age of Onset   Aneurysm Mother        cns   Hyperlipidemia Mother    Lung cancer Father         smoker   Aneurysm Father        AAA   Heart attack Sister 13   Diabetes Brother        type 1   Lung cancer Paternal Uncle        smoker   Aneurysm Maternal Grandmother        cns;PRE 1 (mid 8s)   Diabetes Maternal Grandfather        type 1   Lung cancer Paternal Grandfather  smoker   Aneurysm Paternal Grandmother        AAA   PE: BP 130/78 (BP Location: Right Arm, Patient Position: Sitting, Cuff Size: Normal)   Pulse 74   Ht 5\' 10"  (1.778 m)   Wt 200 lb 9.6 oz (91 kg)   SpO2 96%   BMI 28.78 kg/m   Wt Readings from Last 3 Encounters:  10/26/22 200 lb 9.6 oz (91 kg)  05/13/22 195 lb (88.5 kg)  04/27/22 197 lb (89.4 kg)   Constitutional: overweight, in NAD Eyes:  EOMI, no exophthalmos ENT: no neck masses, no cervical lymphadenopathy Cardiovascular: RRR, No MRG Respiratory: CTA B Musculoskeletal: no deformities Skin:no rashes Neurological: no tremor with outstretched hands Diabetic Foot Exam - Simple   Simple Foot Form Diabetic Foot exam was performed with the following findings: Yes 10/26/2022  8:35 AM  Visual Inspection No deformities, no ulcerations, no other skin breakdown bilaterally: Yes Sensation Testing Intact to touch and monofilament testing bilaterally: Yes Pulse Check Posterior Tibialis and Dorsalis pulse intact bilaterally: Yes Comments     ASSESSMENT: 1. DM1, uncontrolled, with complications - CKD  We diagnosed insulin deficiency, consistent with a diagnosis of type 1 diabetes.  No detectable autoimmunity: Component     Latest Ref Rng & Units 09/02/2017  ZNT8 Antibodies     U/mL <15  Glutamic Acid Decarb Ab     <5 IU/mL <5  C-Peptide     0.80 - 3.85 ng/mL 0.51 (L)  Glucose, Plasma     65 - 99 mg/dL 161 (H)  Islet Cell Ab     Neg:<1:1 Negative   2. HL  3.  Overweight  PLAN:  1. Patient with longstanding, controlled, type 1 diabetes, diagnosed as such in 2019.  Sugars initially improved significantly after adding basal insulin  but he had to add rapid acting insulin before meals.  He also is on SGLT2 inhibitor and GLP-1 receptor agonist.  HbA1c was excellent at last visit, only slightly higher than before, at 6.0%.  He had occasional low blood sugars so we decreased his Toujeo dose further. CGM interpretation: -At today's visit, we reviewed his CGM downloads: It appears that 95% of values are in target range (goal >70%), while 4% are higher than 180 (goal <25%), and 1% are lower than 70 (goal <4%).  The calculated average blood sugar is 117.  The projected HbA1c for the next 3 months (GMI) is 6.1%. -Reviewing the CGM trends, sugars appear to be mostly well-controlled, within the target range, with only occasional higher blood sugars after breakfast and lunch.  Overall, this is excellent control so I did not recommend a change in regimen.  However, he describes an occasional mild low during the night and upon questioning, he is on the freestyle libre 14 CGM, which does not have alarms.  I strongly recommended a CGM with alarms and called in a prescription for the freestyle libre 3 CGM for him.  Discussed about the different alarms, which ones he can change, and which is the default one. - I advised him to: Patient Instructions  Please continue: - Farxiga 10 mg in am - Ozempic 1 mg weekly - FiAsp 3-6 units before meals, and 3 units before a snack and coffee or before playing golf - Toujeo 16 units daily  Please come back for a follow-up appointment in 6 months.  - we checked his HbA1c: 5.8% (lower) - advised to check sugars at different times of the day - 4x a day, rotating  check times - advised for yearly eye exams >> he is UTD - return to clinic in 6 months  2. HL  -Reviewed latest lipid panel from 04/2022: All fractions at goal: Lab Results  Component Value Date   CHOL 102 05/13/2022   HDL 58.50 05/13/2022   LDLCALC 35 05/13/2022   LDLDIRECT 118.0 07/13/2017   TRIG 44.0 05/13/2022   CHOLHDL 2 05/13/2022  -He  continues on Lipitor 40 mg daily and Repatha, without side effects  3.  Overweight -continue SGLT 2 inhibitor and GLP-1 receptor agonist which should also help with weight loss -he gained net 1 pound since last visit  Carlus Pavlov, MD PhD [facility] Endocrinology

## 2022-10-26 NOTE — Patient Instructions (Addendum)
Please continue: - Farxiga 10 mg in am - Ozempic 1 mg weekly - FiAsp 3-6 units before meals, and 3 units before snack, coffee, exercise - Toujeo 16 units daily  Please come back for a follow-up appointment in 6 months.

## 2022-11-13 DIAGNOSIS — S0992XA Unspecified injury of nose, initial encounter: Secondary | ICD-10-CM | POA: Diagnosis not present

## 2022-11-13 DIAGNOSIS — Y92018 Other place in single-family (private) house as the place of occurrence of the external cause: Secondary | ICD-10-CM | POA: Diagnosis not present

## 2022-11-18 DIAGNOSIS — M25511 Pain in right shoulder: Secondary | ICD-10-CM | POA: Diagnosis not present

## 2022-11-29 ENCOUNTER — Encounter: Payer: Self-pay | Admitting: Internal Medicine

## 2022-11-30 ENCOUNTER — Other Ambulatory Visit: Payer: Self-pay

## 2022-11-30 DIAGNOSIS — E119 Type 2 diabetes mellitus without complications: Secondary | ICD-10-CM

## 2022-11-30 MED ORDER — OZEMPIC (1 MG/DOSE) 4 MG/3ML ~~LOC~~ SOPN
PEN_INJECTOR | SUBCUTANEOUS | 3 refills | Status: DC
Start: 2022-11-30 — End: 2024-02-13

## 2022-12-21 ENCOUNTER — Other Ambulatory Visit: Payer: Self-pay | Admitting: Internal Medicine

## 2022-12-21 MED ORDER — NOVOLOG FLEXPEN 100 UNIT/ML ~~LOC~~ SOPN: PEN_INJECTOR | SUBCUTANEOUS | 11 refills | Status: DC

## 2023-03-11 ENCOUNTER — Other Ambulatory Visit: Payer: Self-pay

## 2023-03-11 DIAGNOSIS — E1021 Type 1 diabetes mellitus with diabetic nephropathy: Secondary | ICD-10-CM

## 2023-03-11 MED ORDER — TOUJEO MAX SOLOSTAR 300 UNIT/ML ~~LOC~~ SOPN
18.0000 [IU] | PEN_INJECTOR | Freq: Every day | SUBCUTANEOUS | 3 refills | Status: DC
Start: 2023-03-11 — End: 2023-12-20

## 2023-03-24 DIAGNOSIS — D225 Melanocytic nevi of trunk: Secondary | ICD-10-CM | POA: Diagnosis not present

## 2023-03-24 DIAGNOSIS — L821 Other seborrheic keratosis: Secondary | ICD-10-CM | POA: Diagnosis not present

## 2023-03-24 DIAGNOSIS — D692 Other nonthrombocytopenic purpura: Secondary | ICD-10-CM | POA: Diagnosis not present

## 2023-03-24 DIAGNOSIS — L570 Actinic keratosis: Secondary | ICD-10-CM | POA: Diagnosis not present

## 2023-03-24 DIAGNOSIS — L218 Other seborrheic dermatitis: Secondary | ICD-10-CM | POA: Diagnosis not present

## 2023-04-07 DIAGNOSIS — N401 Enlarged prostate with lower urinary tract symptoms: Secondary | ICD-10-CM | POA: Diagnosis not present

## 2023-04-07 DIAGNOSIS — R350 Frequency of micturition: Secondary | ICD-10-CM | POA: Diagnosis not present

## 2023-04-13 DIAGNOSIS — R350 Frequency of micturition: Secondary | ICD-10-CM | POA: Diagnosis not present

## 2023-04-13 DIAGNOSIS — N5201 Erectile dysfunction due to arterial insufficiency: Secondary | ICD-10-CM | POA: Diagnosis not present

## 2023-04-13 DIAGNOSIS — N401 Enlarged prostate with lower urinary tract symptoms: Secondary | ICD-10-CM | POA: Diagnosis not present

## 2023-04-18 ENCOUNTER — Other Ambulatory Visit: Payer: Self-pay

## 2023-04-18 DIAGNOSIS — Z794 Long term (current) use of insulin: Secondary | ICD-10-CM

## 2023-04-18 MED ORDER — FREESTYLE LIBRE 3 SENSOR MISC
1.0000 | 3 refills | Status: DC
Start: 2023-04-18 — End: 2023-04-20

## 2023-04-20 ENCOUNTER — Encounter: Payer: Self-pay | Admitting: Internal Medicine

## 2023-04-20 DIAGNOSIS — E1021 Type 1 diabetes mellitus with diabetic nephropathy: Secondary | ICD-10-CM

## 2023-04-20 MED ORDER — FREESTYLE LIBRE 3 PLUS SENSOR MISC
1.0000 | 3 refills | Status: DC
Start: 2023-04-20 — End: 2023-04-22

## 2023-04-22 ENCOUNTER — Telehealth: Payer: Self-pay

## 2023-04-22 DIAGNOSIS — E1021 Type 1 diabetes mellitus with diabetic nephropathy: Secondary | ICD-10-CM

## 2023-04-22 MED ORDER — DEXCOM G7 SENSOR MISC
3 refills | Status: DC
Start: 2023-04-22 — End: 2024-04-16

## 2023-04-22 NOTE — Telephone Encounter (Signed)
Patient called and states that he spoke with his insurance company and they are no longer covering [place] and they are advising Dexcom.   OK to switch?

## 2023-04-27 ENCOUNTER — Ambulatory Visit: Payer: BC Managed Care – PPO | Admitting: Internal Medicine

## 2023-04-27 ENCOUNTER — Encounter: Payer: Self-pay | Admitting: Internal Medicine

## 2023-04-27 VITALS — BP 120/64 | HR 57 | Ht 70.0 in | Wt 196.4 lb

## 2023-04-27 DIAGNOSIS — E1021 Type 1 diabetes mellitus with diabetic nephropathy: Secondary | ICD-10-CM | POA: Diagnosis not present

## 2023-04-27 DIAGNOSIS — E782 Mixed hyperlipidemia: Secondary | ICD-10-CM | POA: Diagnosis not present

## 2023-04-27 DIAGNOSIS — E663 Overweight: Secondary | ICD-10-CM | POA: Diagnosis not present

## 2023-04-27 NOTE — Progress Notes (Signed)
Patient ID: Luke West, male   DOB: 1956-05-11, 67 y.o.   MRN: [id number]  HPI: Luke West is a 67 y.o.-year-old male, returning for follow-up for DM2, dx in 2003, but diagnosed as DM1 in 08/2017, insulin-dependent since 08/2017, uncontrolled, with complications (CKD). Last visit was 6 months ago.  Interim history: No increased urination, blurry vision, nausea, chest pain. He mentions he had more lows lately, especially in the afternoon, but also overnight.  Reviewed HbA1c levels: Lab Results  Component Value Date   HGBA1C 5.8 (A) 10/26/2022   HGBA1C 6.0 (A) 04/27/2022   HGBA1C 5.9 (A) 10/20/2021  04/06/2016: HbA1c calculated from fructosamine: 6.08%  Pt is on a regimen of: - Jardiance 25 mg in a.m. >> Farxiga 10 mg in am - Ozempic 0.5 mg weekly - started 05/2019 >> 1 mg weekly -increased 09/2019 - Toujeo 14 >> 22 >> 18-20 >> 22 >> 20 >> 18 >> 16 units daily - Lyumjev >> FiAsp 3-6 units before meals, 3 units before a snack, coffee, exercise Workout (cardio and strength - HIIT) >> sugars were higher after exercise: 200s-needs to take 3 to 4 units of Fiasp before exercise, which works. We stopped Onglyza and glipizide in 05/2019 and switch to insulin Previously on JanuMet and Kombiglyze. He could not tolerate metformin ER so we stopped in 02/2018.  He checks his sugars more than 4 times a day with his freestyle libre CGM:  Previously:  Previously:   Lowest sugar was 41 ... >> 50s >> 50s; he has hypoglycemia awareness in the 70s. Highest sugar was 200s >> 300 >> 200s.  Glucometer: One Touch Ultra Mini  Pt's meals are: - Breakfast: banana or egg + cheese + sausage - Lunch: sandwich, soup, chilli - Dinner: meat, veggies, strach - Snacks: 1+ - nuts; crackers + PB, chips and salsa Drinks water, and sweet tea.  He was exercising 5 times a week at the gym but Luke since the coronavirus pandemic.  He  is now exercising consistently: In the morning and then walking his dog  later in the day  -+ Mild CKD, last BUN/creatinine:  Lab Results  Component Value Date   BUN 23 05/13/2022   CREATININE 1.24 05/13/2022   Lab Results  Component Value Date   GFRNONAA 72 12/12/2018   GFRNONAA 61 08/23/2018   GFRNONAA 71 03/20/2018   GFRNONAA 72.64 03/05/2010   GFRNONAA 67.46 09/11/2008   GFRNONAA 52 08/21/2007   GFRNONAA 75 08/22/2006   No MAU: Lab Results  Component Value Date   MICRALBCREAT 0.6 05/13/2022   MICRALBCREAT 1.4 05/11/2021   MICRALBCREAT 0.7 05/01/2020   MICRALBCREAT 1.6 05/01/2019   MICRALBCREAT 0.9 08/13/2016   MICRALBCREAT 0.8 07/17/2014   MICRALBCREAT 0.7 08/10/2013   MICRALBCREAT 0.8 03/02/2012   MICRALBCREAT 1.7 10/27/2011   MICRALBCREAT 1.1 07/26/2011   -+ HL; last set of lipids: Lab Results  Component Value Date   CHOL 102 05/13/2022   HDL 58.50 05/13/2022   LDLCALC 35 05/13/2022   LDLDIRECT 118.0 07/13/2017   TRIG 44.0 05/13/2022   CHOLHDL 2 05/13/2022  He restarted back on Lipitor in 04/2018.  He is currently on Lipitor 40 (dose decreased from 80 mg daily) and Repatha.  - last eye exam was:  09/06/2022: No DR  - no numbness and tingling in his feet.  Last foot exam 10/26/2022.  He had a clean cath in 02/2018.  He sees Dr. Duke West. He also has a history of HTN.  He has Raynaud's phenomena.  ROS: + see HPI  I reviewed pt's medications, allergies, PMH, social hx, family hx, and changes were documented in the history of present illness. Otherwise, unchanged from my initial visit note.  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   Total bilirubin 1.4   Hyperlipemia    Raynaud phenomenon    Past Surgical History:  Procedure Laterality Date   COLONOSCOPY  2005 & 2010   negative X 2; Dr Ewing Schlein (due 2020)   LEFT HEART CATH AND CORONARY  ANGIOGRAPHY N/A 03/21/2018   Procedure: LEFT HEART CATH AND CORONARY ANGIOGRAPHY;  Surgeon: West, Luke M, MD;  Location: [facility] INVASIVE CV LAB;  Service: Cardiovascular;  Laterality: N/A;   SHOULDER SURGERY     post dislocation sequellae   TONSILLECTOMY AND ADENOIDECTOMY     History   Social History   Marital Status: Married    Spouse Name: N/A   Number of Children: 3   Occupational History   Real estate development/construction   Social History Main Topics   Smoking status: Former Smoker    Quit date: 06/28/1990   Smokeless tobacco: Not on file     Comment: IRS:WNIOE 3-4 year . Smoked [phone] , up to 1-2 ppd   Alcohol Use: 8.4 oz/week    14 Glasses of wine per week     Comment:  socially   Drug Use: No   Current Outpatient Medications on File Prior to Visit  Medication Sig Dispense Refill   atorvastatin (LIPITOR) 40 MG tablet TAKE 1 TABLET(40 MG) BY MOUTH DAILY 90 tablet 3   Continuous Glucose Sensor (DEXCOM G7 SENSOR) MISC Use to monitor glucose continuously DxCode:E10.21 9 each 3   dapagliflozin propanediol (FARXIGA) 10 MG TABS tablet Take 1 tablet (10 mg total) by mouth daily before breakfast. 90 tablet 3   Evolocumab (REPATHA SURECLICK) 140 MG/ML SOAJ Inject 140 mg into the skin every 14 (fourteen) days. 6 mL 3   insulin aspart (FIASP FLEXTOUCH) 100 UNIT/ML FlexTouch Pen Inject under skin 3 to 6 units before meals, and 3 units before a snack (up to 25 units a day) 15 mL 3   insulin aspart (NOVOLOG FLEXPEN) 100 UNIT/ML FlexPen Inject under skin 3 to 6 units before meals, and 3 units before a snack (up to 25 units a day) 15 mL 11   insulin glargine, 2 Unit Dial, (TOUJEO MAX SOLOSTAR) 300 UNIT/ML Solostar Pen Inject 18 Units into the skin daily. 6 mL 3   Insulin Pen Needle (BD PEN NEEDLE NANO 2ND GEN) 32G X 4 MM MISC USE 5 TIMES A DAY 500 each 3   Multiple Vitamin (MULTIVITAMIN) tablet Take 1 tablet by mouth daily.     Semaglutide, 1 MG/DOSE, (OZEMPIC, 1 MG/DOSE,) 4 MG/3ML  SOPN INJECT 1MG  INTO THE SKIN ONCE A WEEK. 9 mL 3   No current facility-administered medications on file prior to visit.   No Known Allergies Family History  Problem Relation Age of Onset   Aneurysm Mother        cns   Hyperlipidemia Mother    Lung cancer Father        smoker   Aneurysm Father        AAA   Heart attack Sister 16   Diabetes Brother        type 1   Lung  cancer Paternal Uncle        smoker   Aneurysm Maternal Grandmother        cns;PRE 71 (mid 42s)   Diabetes Maternal Grandfather        type 1   Lung cancer Paternal Grandfather        smoker   Aneurysm Paternal Grandmother        AAA   PE: BP 120/64   Pulse (!) 57   Ht 5\' 10"  (1.778 m)   Wt 196 lb 6.4 oz (89.1 kg)   SpO2 95%   BMI 28.18 kg/m   Wt Readings from Last 3 Encounters:  04/27/23 196 lb 6.4 oz (89.1 kg)  10/26/22 200 lb 9.6 oz (91 kg)  05/13/22 195 lb (88.5 kg)   Constitutional: overweight, in NAD Eyes:  EOMI, no exophthalmos ENT: no neck masses, no cervical lymphadenopathy Cardiovascular: RRR, No MRG Respiratory: CTA B Musculoskeletal: no deformities Skin:no rashes Neurological: no tremor with outstretched hands  ASSESSMENT: 1. DM1, uncontrolled, with complications - CKD  We diagnosed insulin deficiency, consistent with a diagnosis of type 1 diabetes.  No detectable autoimmunity: Component     Latest Ref Rng & Units 09/02/2017  ZNT8 Antibodies     U/mL <15  Glutamic Acid Decarb Ab     <5 IU/mL <5  C-Peptide     0.80 - 3.85 ng/mL 0.51 (L)  Glucose, Plasma     65 - 99 mg/dL 161 (H)  Islet Cell Ab     Neg:<1:1 Negative   2. HL  3.  Overweight  PLAN:  1. Patient with longstanding, uncontrolled, type 1 diabetes, diagnosed as such in 2019.  Sugars initially improved after adding basal insulin but we had to add rapid acting insulin before meals afterwards.  He is also on SGLT2 inhibitor and GLP-1 receptor agonist.  His HbA1c was excellent at last visit, improved, to 5.8%.  Sugars  are mostly well-controlled, fluctuating within the target range with only occasional higher blood sugars after breakfast and lunch.  We did not change his regimen. CGM interpretation: -At today's visit, we reviewed his CGM downloads: It appears that 93% of values are in target range (goal >70%), while 5% are higher than 180 (goal <25%), and 2% are lower than 70 (goal <4%).  The calculated average blood sugar is 120.  The projected HbA1c for the next 3 months (GMI) is 6.2%. -Reviewing the CGM trends, sugars are fluctuating within the target range with occasional hyperglycemic spikes but also low blood sugars mostly in the late afternoon but also scattered throughout the day and especially at night.  We discussed about reducing his dose of Toujeo, but also to reduce the dose of his Fiasp.  Upon questioning, he is taking the 6 units of Fiasp with most of his meals.  We discussed about reducing the dose and only keeping this for a larger meal.  I also advised him to hold the 3 units of insulin before coffee if he is exercising afterwards.  At today's visit, a CBG was 75 in the office after coffee and exercise so he was given apple juice to correct this. -We discussed about checking blood sugars every time before driving and only start driving if they are higher than 100.  Also, for long drives, sugars should be checked every hour and correcting if lower than 100. - I advised him to: Patient Instructions  Please continue: - Farxiga 10 mg in am - Ozempic 1 mg weekly - FiAsp  3-6 units before meals, and 3 units before a snack and coffee (no insulin before coffee if plan to exercise right after)  Reduce: - Toujeo 14 units daily  Please come back for a follow-up appointment in 6 months.  - we checked his HbA1c: 5.6% (lower) - advised to check sugars at different times of the day - 4x a day, rotating check times - advised for yearly eye exams >> he is UTD - return to clinic in 6 months  2. HL  -Reviewed  latest lipid panel from 04/2022: All fractions at goal: Lab Results  Component Value Date   CHOL 102 05/13/2022   HDL 58.50 05/13/2022   LDLCALC 35 05/13/2022   LDLDIRECT 118.0 07/13/2017   TRIG 44.0 05/13/2022   CHOLHDL 2 05/13/2022  -She continues on Lipitor 40 mg daily and Repatha without side effects  3.  Overweight -continue SGLT 2 inhibitor and GLP-1 receptor agonist which should also help with weight loss -he gained net 1 pound before last visit -Since last visit, he lost 4 pounds.  Carlus Pavlov, MD PhD [facility] Endocrinology

## 2023-04-27 NOTE — Patient Instructions (Addendum)
Please continue: - Farxiga 10 mg in am - Ozempic 1 mg weekly - FiAsp 3-6 units before meals, and 3 units before a snack and coffee (no insulin before coffee if plan to exercise right after)  Reduce: - Toujeo 14 units daily  Please come back for a follow-up appointment in 6 months.

## 2023-05-17 ENCOUNTER — Encounter: Payer: Self-pay | Admitting: Internal Medicine

## 2023-05-17 NOTE — Patient Instructions (Addendum)
Blood work was ordered.       Medications changes include :   None    A referral was ordered and someone will call you to schedule an appointment.     Return in about 1 year (around 05/17/2024) for Physical Exam.   Health Maintenance, Male Adopting a healthy lifestyle and getting preventive care are important in promoting health and wellness. Ask your health care provider about: The right schedule for you to have regular tests and exams. Things you can do on your own to prevent diseases and keep yourself healthy. What should I know about diet, weight, and exercise? Eat a healthy diet  Eat a diet that includes plenty of vegetables, fruits, low-fat dairy products, and lean protein. Do not eat a lot of foods that are high in solid fats, added sugars, or sodium. Maintain a healthy weight Body mass index (BMI) is a measurement that can be used to identify possible weight problems. It estimates body fat based on height and weight. Your health care provider can help determine your BMI and help you achieve or maintain a healthy weight. Get regular exercise Get regular exercise. This is one of the most important things you can do for your health. Most adults should: Exercise for at least 150 minutes each week. The exercise should increase your heart rate and make you sweat (moderate-intensity exercise). Do strengthening exercises at least twice a week. This is in addition to the moderate-intensity exercise. Spend less time sitting. Even light physical activity can be beneficial. Watch cholesterol and blood lipids Have your blood tested for lipids and cholesterol at 67 years of age, then have this test every 5 years. You may need to have your cholesterol levels checked more often if: Your lipid or cholesterol levels are high. You are older than 67 years of age. You are at high risk for heart disease. What should I know about cancer screening? Many types of cancers can be  detected early and may often be prevented. Depending on your health history and family history, you may need to have cancer screening at various ages. This may include screening for: Colorectal cancer. Prostate cancer. Skin cancer. Lung cancer. What should I know about heart disease, diabetes, and high blood pressure? Blood pressure and heart disease High blood pressure causes heart disease and increases the risk of stroke. This is more likely to develop in people who have high blood pressure readings or are overweight. Talk with your health care provider about your target blood pressure readings. Have your blood pressure checked: Every 3-5 years if you are 48-47 years of age. Every year if you are 44 years old or older. If you are between the ages of 67 and 10 and are a current or former smoker, ask your health care provider if you should have a one-time screening for abdominal aortic aneurysm (AAA). Diabetes Have regular diabetes screenings. This checks your fasting blood sugar level. Have the screening done: Once every three years after age 57 if you are at a normal weight and have a low risk for diabetes. More often and at a younger age if you are overweight or have a high risk for diabetes. What should I know about preventing infection? Hepatitis B If you have a higher risk for hepatitis B, you should be screened for this virus. Talk with your health care provider to find out if you are at risk for hepatitis B infection. Hepatitis C Blood testing is recommended for:  Everyone born from [birth year] through 1965. Anyone with known risk factors for hepatitis C. Sexually transmitted infections (STIs) You should be screened each year for STIs, including gonorrhea and chlamydia, if: You are sexually active and are younger than 68 years of age. You are older than 67 years of age and your health care provider tells you that you are at risk for this type of infection. Your sexual activity has changed  since you were last screened, and you are at increased risk for chlamydia or gonorrhea. Ask your health care provider if you are at risk. Ask your health care provider about whether you are at high risk for HIV. Your health care provider may recommend a prescription medicine to help prevent HIV infection. If you choose to take medicine to prevent HIV, you should first get tested for HIV. You should then be tested every 3 months for as long as you are taking the medicine. Follow these instructions at home: Alcohol use Do not drink alcohol if your health care provider tells you not to drink. If you drink alcohol: Limit how much you have to 0-2 drinks a day. Know how much alcohol is in your drink. In the [place] one drink equals one 12 oz bottle of beer (355 mL), one 5 oz glass of wine (148 mL), or one 1 oz glass of hard liquor (44 mL). Lifestyle Do not use any products that contain nicotine or tobacco. These products include cigarettes, chewing tobacco, and vaping devices, such as e-cigarettes. If you need help quitting, ask your health care provider. Do not use street drugs. Do not share needles. Ask your health care provider for help if you need support or information about quitting drugs. General instructions Schedule regular health, dental, and eye exams. Stay current with your vaccines. Tell your health care provider if: You often feel depressed. You have ever been abused or do not feel safe at home. Summary Adopting a healthy lifestyle and getting preventive care are important in promoting health and wellness. Follow your health care provider's instructions about healthy diet, exercising, and getting tested or screened for diseases. Follow your health care provider's instructions on monitoring your cholesterol and blood pressure. This information is not intended to replace advice given to you by your health care provider. Make sure you discuss any questions you have with your health care  provider. Document Revised: 11/03/2020 Document Reviewed: 11/03/2020 Elsevier Patient Education  2024 [employer].

## 2023-05-17 NOTE — Progress Notes (Unsigned)
Subjective:    Patient ID: Luke West, male    DOB: 1955/08/02, 67 y.o.   MRN: [id number]     HPI Luke West is here for a physical exam and his chronic medical problems.   No new health issues.   Wonders about Ct CAC.     Medications and allergies reviewed with patient and updated if appropriate.  Current Outpatient Medications on File Prior to Visit  Medication Sig Dispense Refill   atorvastatin (LIPITOR) 40 MG tablet TAKE 1 TABLET(40 MG) BY MOUTH DAILY 90 tablet 3   Continuous Glucose Sensor (DEXCOM G7 SENSOR) MISC Use to monitor glucose continuously DxCode:E10.21 9 each 3   dapagliflozin propanediol (FARXIGA) 10 MG TABS tablet Take 1 tablet (10 mg total) by mouth daily before breakfast. 90 tablet 3   Evolocumab (REPATHA SURECLICK) 140 MG/ML SOAJ Inject 140 mg into the skin every 14 (fourteen) days. 6 mL 3   insulin aspart (FIASP FLEXTOUCH) 100 UNIT/ML FlexTouch Pen Inject under skin 3 to 6 units before meals, and 3 units before a snack (up to 25 units a day) 15 mL 3   insulin glargine, 2 Unit Dial, (TOUJEO MAX SOLOSTAR) 300 UNIT/ML Solostar Pen Inject 18 Units into the skin daily. 6 mL 3   Insulin Pen Needle (BD PEN NEEDLE NANO 2ND GEN) 32G X 4 MM MISC USE 5 TIMES A DAY 500 each 3   Multiple Vitamin (MULTIVITAMIN) tablet Take 1 tablet by mouth daily.     Semaglutide, 1 MG/DOSE, (OZEMPIC, 1 MG/DOSE,) 4 MG/3ML SOPN INJECT 1MG  INTO THE SKIN ONCE A WEEK. 9 mL 3   No current facility-administered medications on file prior to visit.    Review of Systems  Constitutional:  Negative for fever.  Eyes:  Negative for visual disturbance.  Respiratory:  Negative for cough, shortness of breath and wheezing.   Cardiovascular:  Negative for chest pain, palpitations and leg swelling.  Gastrointestinal:  Positive for diarrhea (once every 2 weeks). Negative for abdominal pain, blood in stool and constipation.       No gerd  Genitourinary:  Negative for difficulty urinating, dysuria and  hematuria.  Musculoskeletal:  Positive for arthralgias (shoulders). Negative for back pain.  Skin:  Negative for rash.  Neurological:  Negative for light-headedness and headaches.  Psychiatric/Behavioral:  Negative for dysphoric mood and sleep disturbance. The patient is not nervous/anxious.        Objective:   Vitals:   05/18/23 0748  BP: 120/68  Pulse: (!) 56  Temp: 98.5 F (36.9 C)  SpO2: 96%   Filed Weights   05/18/23 0748  Weight: 193 lb (87.5 kg)   Body mass index is 27.69 kg/m.  BP Readings from Last 3 Encounters:  05/18/23 120/68  04/27/23 120/64  10/26/22 130/78    Wt Readings from Last 3 Encounters:  05/18/23 193 lb (87.5 kg)  04/27/23 196 lb 6.4 oz (89.1 kg)  10/26/22 200 lb 9.6 oz (91 kg)      Physical Exam Constitutional: He appears well-developed and well-nourished. No distress.  HENT:  Head: Normocephalic and atraumatic.  Right Ear: External ear normal.  Left Ear: External ear normal.  Normal ear canals and TM b/l  Mouth/Throat: Oropharynx is clear and moist. Eyes: Conjunctivae and EOM are normal.  Neck: Neck supple. No tracheal deviation present. No thyromegaly present.  No carotid bruit  Cardiovascular: Normal rate, regular rhythm, normal heart sounds and intact distal pulses.   No murmur heard.  No lower extremity edema.  Pulmonary/Chest: Effort normal and breath sounds normal. No respiratory distress. He has no wheezes. He has no rales.  Abdominal: Soft. He exhibits no distension. There is no tenderness.  Genitourinary: deferred  Lymphadenopathy:   He has no cervical adenopathy.  Skin: Skin is warm and dry. He is not diaphoretic.  Psychiatric: He has a normal mood and affect. His behavior is normal.         Assessment & Plan:   Physical exam: Screening blood work  ordered Exercise   regular Weight-good for age Substance abuse   none   Reviewed recommended immunizations.   Health Maintenance  Topic Date Due   Diabetic kidney  evaluation - eGFR measurement  05/14/2023   Diabetic kidney evaluation - Urine ACR  05/14/2023   HEMOGLOBIN A1C  04/27/2023   COVID-19 Vaccine (6 - 2023-24 season) 07/16/2023   OPHTHALMOLOGY EXAM  09/06/2023   FOOT EXAM  10/26/2023   Pneumonia Vaccine 29+ Years old (3 of 3 - PPSV23 or PCV20) 03/27/2027   DTaP/Tdap/Td (3 - Td or Tdap) 04/30/2029   Colonoscopy  06/06/2029   INFLUENZA VACCINE  Completed   Hepatitis C Screening  Completed   Zoster Vaccines- Shingrix  Completed   HPV VACCINES  Aged Out     See Problem List for Assessment and Plan of chronic medical problems.

## 2023-05-18 ENCOUNTER — Ambulatory Visit (INDEPENDENT_AMBULATORY_CARE_PROVIDER_SITE_OTHER): Payer: BC Managed Care – PPO | Admitting: Internal Medicine

## 2023-05-18 VITALS — BP 120/68 | HR 56 | Temp 98.5°F | Ht 70.0 in | Wt 193.0 lb

## 2023-05-18 DIAGNOSIS — I700 Atherosclerosis of aorta: Secondary | ICD-10-CM

## 2023-05-18 DIAGNOSIS — Z0000 Encounter for general adult medical examination without abnormal findings: Secondary | ICD-10-CM | POA: Diagnosis not present

## 2023-05-18 DIAGNOSIS — E1165 Type 2 diabetes mellitus with hyperglycemia: Secondary | ICD-10-CM

## 2023-05-18 DIAGNOSIS — E7849 Other hyperlipidemia: Secondary | ICD-10-CM

## 2023-05-18 DIAGNOSIS — Z125 Encounter for screening for malignant neoplasm of prostate: Secondary | ICD-10-CM

## 2023-05-18 DIAGNOSIS — Z794 Long term (current) use of insulin: Secondary | ICD-10-CM | POA: Diagnosis not present

## 2023-05-18 LAB — COMPREHENSIVE METABOLIC PANEL
ALT: 21 U/L (ref 0–53)
AST: 35 U/L (ref 0–37)
Albumin: 4.6 g/dL (ref 3.5–5.2)
Alkaline Phosphatase: 64 U/L (ref 39–117)
BUN: 22 mg/dL (ref 6–23)
CO2: 28 meq/L (ref 19–32)
Calcium: 9.7 mg/dL (ref 8.4–10.5)
Chloride: 104 meq/L (ref 96–112)
Creatinine, Ser: 1.21 mg/dL (ref 0.40–1.50)
GFR: 62.13 mL/min (ref >60.00)
Glucose, Bld: 124 mg/dL — ABNORMAL HIGH (ref 70–99)
Potassium: 4.5 meq/L (ref 3.5–5.1)
Sodium: 139 meq/L (ref 135–145)
Total Bilirubin: 0.8 mg/dL (ref 0.2–1.2)
Total Protein: 6.8 g/dL (ref 6.0–8.3)

## 2023-05-18 LAB — CBC WITH DIFFERENTIAL/PLATELET
Basophils Absolute: 0 10*3/uL (ref 0.0–0.1)
Basophils Relative: 1 % (ref 0.0–3.0)
Eosinophils Absolute: 0.1 10*3/uL (ref 0.0–0.7)
Eosinophils Relative: 1.8 % (ref 0.0–5.0)
HCT: 46.4 % (ref 39.0–52.0)
Hemoglobin: 15.4 g/dL (ref 13.0–17.0)
Lymphocytes Relative: 27.2 % (ref 12.0–46.0)
Lymphs Abs: 1.4 10*3/uL (ref 0.7–4.0)
MCHC: 33.1 g/dL (ref 30.0–36.0)
MCV: 98.4 fL (ref 78.0–100.0)
Monocytes Absolute: 0.4 10*3/uL (ref 0.1–1.0)
Monocytes Relative: 8 % (ref 3.0–12.0)
Neutro Abs: 3.3 10*3/uL (ref 1.4–7.7)
Neutrophils Relative %: 62 % (ref 43.0–77.0)
Platelets: 179 10*3/uL (ref 150.0–400.0)
RBC: 4.72 Mil/uL (ref 4.22–5.81)
RDW: 12.6 % (ref 11.5–15.5)
WBC: 5.2 10*3/uL (ref 4.0–10.5)

## 2023-05-18 LAB — LIPID PANEL
Cholesterol: 107 mg/dL (ref 0–200)
HDL: 67.7 mg/dL (ref >39.00)
LDL Cholesterol: 32 mg/dL (ref 0–99)
NonHDL: 39.77
Total CHOL/HDL Ratio: 2
Triglycerides: 39 mg/dL (ref 0.0–149.0)
VLDL: 7.8 mg/dL (ref 0.0–40.0)

## 2023-05-18 LAB — TSH: TSH: 2.5 u[IU]/mL (ref 0.35–5.50)

## 2023-05-18 LAB — MICROALBUMIN / CREATININE URINE RATIO
Creatinine,U: 105.3 mg/dL
Microalb Creat Ratio: 0.7 mg/g (ref 0.0–30.0)
Microalb, Ur: 0.8 mg/dL (ref 0.0–1.9)

## 2023-05-18 LAB — HEMOGLOBIN A1C: Hgb A1c MFr Bld: 6.1 % (ref 4.6–6.5)

## 2023-05-18 NOTE — Assessment & Plan Note (Signed)
Chronic Continue atorvastatin 40 mg daily, Repatha 140 mg every 14 days CMP, lipid panel, TSH, CBC

## 2023-05-18 NOTE — Assessment & Plan Note (Signed)
Chronic Lab Results  Component Value Date   HGBA1C 5.8 (A) 10/26/2022  Recent A1c in Endo office 5.6% Will check A1c today since the above number was not put into his lab results, check urine microalbumin Sugars are controlled Management per Dr. Elvera Lennox Eye exams up-to-date

## 2023-05-18 NOTE — Assessment & Plan Note (Signed)
Chronic Regular exercise and healthy diet encouraged Check lipid panel, CMP, TSH Continue atorvastatin 40 mg daily, Repatha 140 mg every 14 days

## 2023-05-31 ENCOUNTER — Encounter: Payer: Self-pay | Admitting: Internal Medicine

## 2023-05-31 DIAGNOSIS — E119 Type 2 diabetes mellitus without complications: Secondary | ICD-10-CM

## 2023-05-31 MED ORDER — DAPAGLIFLOZIN PROPANEDIOL 10 MG PO TABS: 10.0000 mg | ORAL_TABLET | Freq: Every day | ORAL | 3 refills | Status: DC

## 2023-07-22 ENCOUNTER — Encounter (HOSPITAL_BASED_OUTPATIENT_CLINIC_OR_DEPARTMENT_OTHER): Payer: Self-pay

## 2023-07-27 ENCOUNTER — Ambulatory Visit (HOSPITAL_BASED_OUTPATIENT_CLINIC_OR_DEPARTMENT_OTHER): Payer: Medicare Other | Admitting: Cardiovascular Disease

## 2023-07-27 VITALS — BP 128/76 | HR 64 | Ht 70.0 in | Wt 196.2 lb

## 2023-07-27 DIAGNOSIS — Z5181 Encounter for therapeutic drug level monitoring: Secondary | ICD-10-CM

## 2023-07-27 DIAGNOSIS — Z8249 Family history of ischemic heart disease and other diseases of the circulatory system: Secondary | ICD-10-CM | POA: Diagnosis not present

## 2023-07-27 DIAGNOSIS — I700 Atherosclerosis of aorta: Secondary | ICD-10-CM

## 2023-07-27 DIAGNOSIS — E7849 Other hyperlipidemia: Secondary | ICD-10-CM

## 2023-07-27 MED ORDER — ATORVASTATIN CALCIUM 20 MG PO TABS: 20.0000 mg | ORAL_TABLET | Freq: Every day | ORAL | 3 refills | Status: DC

## 2023-07-27 NOTE — Progress Notes (Signed)
Cardiology Office Note:  .   Date:  08/11/2023  ID:  Luke West, DOB May 07, 1956, MRN [id number] PCP: Pincus Sanes, MD  [facility] Providers Cardiologist:  None    History of Present Illness: .    Luke West is a 68 y.o. male with diabetes mellitus type 2 and familial hyperlipidemia here for follow up.  He was initially seen 06/2015 to establish care.  Luke West has a family history of premature coronary artery disease.  His brother and sister both had heart attacks in their 29s.  His father had a AAA.  He was previously a patient of Dr. Juanito Doom and underwent stress testing 10 years ago that was negative for ischemia.  At his initial appointment he was exercising regularly and had no symptoms.  He was already on aspirin and a statin.  We discussed the pros and cons of coronary calcium scoring and elected to continue with medical management, diet, and exercise.  In July 2019 he had an episode of near syncope.  He had a heavy workout with a personal trainer and felt nearly exhausted afterwards.  He did a lot of leg exercises.  The following day he went to play golf and felt very tired and short of breath.  He had difficulty walking up the hills and felt dizzy when he got to the top of the hill.  He has several physician friends who urged him to get an EKG performed.  When he was unable to do so he went to his local emergency department where EKG was unchanged from prior.  However, he was found to have rhabdomyolysis with CK greater than 2200.  He was given IV fluids and his statin was held.  He was subsequently seen in our office and underwent left heart catheterization 02/2018 that revealed normal coronaries.  BP was elevated in the office but better at home on repeat.   He was referred to the lipid clinic and started on Repatha 01/2019.  Overall he has been doing well.  He noticed that prior to taking Repatha his blood glucose was averaging in the 130s. He worked with Dr. Elvera Lennox and his  blood sugars have been much better controlled. At his visit 06/2021 he was exercising regularly and doing well.  Luke West has been stable with good blood pressure control and tolerates his medication, Repatha, well. He recalls a past cardiac catheterization which showed no plaque, and he has maintained an LDL cholesterol level of 40 or less for several years.  He exercises five to six days a week and feels okay during these activities. However, he experiences numbness in his feet after using the elliptical for a long time, which has been occurring for several years. He also reports occasional cramps during workouts, but not pain, and these cramps are not associated with walking or resting. No pain in the calves during exercise.  He is currently on atorvastatin and Repatha, with a noted decrease in LDL from 102 to 23 since starting Repatha.  There is a significant family history of cardiovascular disease, with his father having a heart attack in his early fifties, a brother who died at 50, a mother who passed at 28, and a sister who had stents placed at 67. He is currently 68 years old and has surpassed the ages at which his family members experienced cardiovascular events.     ROS:  As per HPI  Studies Reviewed: Marland Kitchen   EKG Interpretation Date/Time:  Wednesday  July 27 2023 11:06:53 EST Ventricular Rate:  57 PR Interval:  250 QRS Duration:  94 QT Interval:  458 QTC Calculation: 445 R Axis:   60  Text Interpretation: Sinus bradycardia with sinus arrhythmia with 1st degree A-V block Early repolarization No previous ECGs available Confirmed by Chilton Si ([id number]) on 07/27/2023 11:37:00 AM     Risk Assessment/Calculations:    Physical Exam:   VS:  BP 128/76   Pulse 64   Ht 5\' 10"  (1.778 m)   Wt 196 lb 3.2 oz (89 kg)   SpO2 99%   BMI 28.15 kg/m  , BMI Body mass index is 28.15 kg/m. GENERAL:  Well appearing HEENT: Pupils equal round and reactive, fundi not visualized, oral mucosa  unremarkable NECK:  No jugular venous distention, waveform within normal limits, carotid upstroke brisk and symmetric, no bruits, no thyromegaly LUNGS:  Clear to auscultation bilaterally HEART:  RRR.  PMI not displaced or sustained,S1 and S2 within normal limits, no S3, no S4, no clicks, no rubs, no murmurs ABD:  Flat, positive bowel sounds normal in frequency in pitch, no bruits, no rebound, no guarding, no midline pulsatile mass, no hepatomegaly, no splenomegaly EXT:  2 plus pulses throughout, no edema, no cyanosis no clubbing SKIN:  No rashes no nodules NEURO:  Cranial nerves II through XII grossly intact, motor grossly intact throughout PSYCH:  Cognitively intact, oriented to person place and time   ASSESSMENT AND PLAN: .    # Cardiovascular Disease Risk: # Hyperlipidemia:  Stable on current regimen. Noted previous catheterization with no plaque. Discussed potential for calcification and the benefits of a calcium score test. -Order calcium score test. -Continue current medications.  # Muscle Cramps Possible statin-induced muscle cramps. Currently on Atorvastatin 40mg . -Reduce Atorvastatin to 20mg  daily. -Check cholesterol levels in April 2025 to ensure continued control.  # Peripheral Neuropathy Reports of numbness in feet during exercise, possibly related to non-diabetic neuropathy. -No changes to current management plan at this time.  # Follow-up Stable, continue current 2-year follow-up schedule unless issues arise.       Dispo: f/u with Catalea Labrecque C. Duke Salvia, MD, [facility] in 2 years  Signed, Chilton Si, MD

## 2023-07-27 NOTE — Patient Instructions (Signed)
Medication Instructions:  DECREASE YOUR ATORVASTATIN TO 20 MG DAILY   *If you need a refill on your cardiac medications before your next appointment, please call your pharmacy*  Lab Work: FASTING LP/CMET IN 2 MONTHS   If you have labs (blood work) drawn today and your tests are completely normal, you will receive your results only by: MyChart Message (if you have MyChart) OR A paper copy in the mail If you have any lab test that is abnormal or we need to change your treatment, we will call you to review the results.  Testing/Procedures: CALCIUM SCORE - THIS WILL COST YOU $99 OUT OF POCKET   Follow-Up: At [facility], you and your health needs are our priority.  As part of our continuing mission to provide you with exceptional heart care, we have created designated Provider Care Teams.  These Care Teams include your primary Cardiologist (physician) and Advanced Practice Providers (APPs -  Physician Assistants and Nurse Practitioners) who all work together to provide you with the care you need, when you need it.  We recommend signing up for the patient portal called "MyChart".  Sign up information is provided on this After Visit Summary.  MyChart is used to connect with patients for Virtual Visits (Telemedicine).  Patients are able to view lab/test results, encounter notes, upcoming appointments, etc.  Non-urgent messages can be sent to your provider as well.   To learn more about what you can do with MyChart, go to [email or web address].    Your next appointment:   2 year(s)  Provider:   Chilton Si, MD    Other Instructions

## 2023-08-05 ENCOUNTER — Other Ambulatory Visit (HOSPITAL_BASED_OUTPATIENT_CLINIC_OR_DEPARTMENT_OTHER): Payer: Self-pay | Admitting: Cardiovascular Disease

## 2023-08-11 ENCOUNTER — Encounter (HOSPITAL_BASED_OUTPATIENT_CLINIC_OR_DEPARTMENT_OTHER): Payer: Self-pay | Admitting: Cardiovascular Disease

## 2023-08-15 DIAGNOSIS — L820 Inflamed seborrheic keratosis: Secondary | ICD-10-CM | POA: Diagnosis not present

## 2023-08-16 ENCOUNTER — Ambulatory Visit (HOSPITAL_BASED_OUTPATIENT_CLINIC_OR_DEPARTMENT_OTHER)
Admission: RE | Admit: 2023-08-16 | Discharge: 2023-08-16 | Disposition: A | Discharge status: Home / Self Care | Payer: Self-pay | Source: Ambulatory Visit | Attending: Cardiovascular Disease | Admitting: Cardiovascular Disease

## 2023-08-16 ENCOUNTER — Encounter (HOSPITAL_BASED_OUTPATIENT_CLINIC_OR_DEPARTMENT_OTHER): Payer: Self-pay | Admitting: Cardiovascular Disease

## 2023-08-16 DIAGNOSIS — E7849 Other hyperlipidemia: Secondary | ICD-10-CM | POA: Insufficient documentation

## 2023-08-16 DIAGNOSIS — I700 Atherosclerosis of aorta: Secondary | ICD-10-CM | POA: Insufficient documentation

## 2023-08-17 ENCOUNTER — Encounter (HOSPITAL_BASED_OUTPATIENT_CLINIC_OR_DEPARTMENT_OTHER): Payer: Self-pay

## 2023-08-17 ENCOUNTER — Encounter: Payer: Self-pay | Admitting: Internal Medicine

## 2023-08-17 ENCOUNTER — Telehealth: Payer: Self-pay | Admitting: Pharmacy Technician

## 2023-08-17 ENCOUNTER — Telehealth: Payer: Self-pay

## 2023-08-17 ENCOUNTER — Ambulatory Visit (HOSPITAL_BASED_OUTPATIENT_CLINIC_OR_DEPARTMENT_OTHER): Payer: BC Managed Care – PPO | Admitting: Cardiovascular Disease

## 2023-08-17 ENCOUNTER — Other Ambulatory Visit (HOSPITAL_COMMUNITY): Payer: Self-pay

## 2023-08-17 NOTE — Telephone Encounter (Signed)
 Pharmacy Patient Advocate Encounter   Received notification from Pt Calls Messages that prior authorization for Ozempic is required/requested.   Insurance verification completed.   The patient is insured through [facility] .   Per test claim: PA required; PA started via CoverMyMeds. KEY [id number] . Waiting for clinical questions to populate.   PA cancelled by insurance: Prior Authorization Not Required

## 2023-08-17 NOTE — Telephone Encounter (Signed)
 Pt needs a PA for [employer] ZO:[id number]  Plan Name: [facility] Enhanced (HMO-POS)

## 2023-08-17 NOTE — Telephone Encounter (Signed)
 Pharmacy Patient Advocate Encounter   Received notification from Patient Advice Request messages that prior authorization for repatha is required/requested.   Insurance verification completed.   The patient is insured through [facility] .   Per test claim: PA required; PA submitted to above mentioned insurance via Fax Key/confirmation #/EOC faxed Status is pending

## 2023-08-18 ENCOUNTER — Other Ambulatory Visit (HOSPITAL_COMMUNITY): Payer: Self-pay

## 2023-08-18 ENCOUNTER — Telehealth: Payer: Self-pay

## 2023-08-18 NOTE — Telephone Encounter (Signed)
 Pharmacy Patient Advocate Encounter  Received notification from [facility] that Prior Authorization for Repatha has been APPROVED from 08/17/23 to 08/16/24. Unable to obtain price due to refill too soon rejection, last fill date 08/05/23 next available fill date02/28/25   PA #/Case ID/Reference #: [id number]

## 2023-08-18 NOTE — Telephone Encounter (Signed)
 I called and spoke with the pt to let him know he should be able to get his Ozempic without a problem since his insurance is stating that the Authorization is not needed.

## 2023-08-18 NOTE — Telephone Encounter (Signed)
 Toujeo needs PA

## 2023-08-23 ENCOUNTER — Other Ambulatory Visit (HOSPITAL_COMMUNITY): Payer: Self-pay

## 2023-08-23 ENCOUNTER — Telehealth: Payer: Self-pay

## 2023-08-23 NOTE — Telephone Encounter (Signed)
 Pharmacy Patient Advocate Encounter   Received notification from Pt Calls Messages that prior authorization for Toujeo is required/requested.   Insurance verification completed.   The patient is insured through [facility] .   Per test claim: PA required; PA started via CoverMyMeds. KEY [id number] . Waiting for clinical questions to populate.   PA cancelled by insurance: Prior Auth not required. Next refill 10/20/23.  Alternate coverage: Lantus

## 2023-08-23 NOTE — Telephone Encounter (Signed)
 OK to change to Lantus - same dose  -if he agrees. Ty! C

## 2023-08-24 ENCOUNTER — Other Ambulatory Visit: Payer: Self-pay

## 2023-08-24 MED ORDER — LANTUS SOLOSTAR 100 UNIT/ML ~~LOC~~ SOPN: 18.0000 [IU] | PEN_INJECTOR | Freq: Every day | SUBCUTANEOUS | 1 refills | Status: DC

## 2023-08-25 ENCOUNTER — Other Ambulatory Visit (HOSPITAL_BASED_OUTPATIENT_CLINIC_OR_DEPARTMENT_OTHER): Payer: Self-pay | Admitting: Cardiovascular Disease

## 2023-08-29 ENCOUNTER — Other Ambulatory Visit: Payer: Self-pay

## 2023-08-29 DIAGNOSIS — E1021 Type 1 diabetes mellitus with diabetic nephropathy: Secondary | ICD-10-CM

## 2023-08-29 MED ORDER — BD PEN NEEDLE NANO 2ND GEN 32G X 4 MM MISC: 3 refills | Status: AC

## 2023-09-08 DIAGNOSIS — H52203 Unspecified astigmatism, bilateral: Secondary | ICD-10-CM | POA: Diagnosis not present

## 2023-09-08 LAB — HM DIABETES EYE EXAM

## 2023-10-04 DIAGNOSIS — Z5181 Encounter for therapeutic drug level monitoring: Secondary | ICD-10-CM | POA: Diagnosis not present

## 2023-10-04 DIAGNOSIS — E7849 Other hyperlipidemia: Secondary | ICD-10-CM | POA: Diagnosis not present

## 2023-10-04 LAB — LIPID PANEL
Chol/HDL Ratio: 2 ratio (ref 0.0–5.0)
Cholesterol, Total: 110 mg/dL (ref 100–199)
HDL: 54 mg/dL (ref >39)
LDL Chol Calc (NIH): 40 mg/dL (ref 0–99)
Triglycerides: 77 mg/dL (ref 0–149)
VLDL Cholesterol Cal: 16 mg/dL (ref 5–40)

## 2023-10-04 LAB — COMPREHENSIVE METABOLIC PANEL WITH GFR
ALT: 21 IU/L (ref 0–44)
AST: 33 IU/L (ref 0–40)
Albumin: 4.5 g/dL (ref 3.9–4.9)
Alkaline Phosphatase: 78 IU/L (ref 44–121)
BUN/Creatinine Ratio: 17 (ref 10–24)
BUN: 21 mg/dL (ref 8–27)
Bilirubin Total: 0.7 mg/dL (ref 0.0–1.2)
CO2: 24 mmol/L (ref 20–29)
Calcium: 9.5 mg/dL (ref 8.6–10.2)
Chloride: 100 mmol/L (ref 96–106)
Creatinine, Ser: 1.26 mg/dL (ref 0.76–1.27)
Globulin, Total: 1.8 g/dL (ref 1.5–4.5)
Glucose: 133 mg/dL — ABNORMAL HIGH (ref 70–99)
Potassium: 4.8 mmol/L (ref 3.5–5.2)
Sodium: 139 mmol/L (ref 134–144)
Total Protein: 6.3 g/dL (ref 6.0–8.5)
eGFR: 63 mL/min/{1.73_m2} (ref >59)

## 2023-10-22 ENCOUNTER — Encounter (HOSPITAL_BASED_OUTPATIENT_CLINIC_OR_DEPARTMENT_OTHER): Payer: Self-pay | Admitting: Cardiovascular Disease

## 2023-10-24 NOTE — Telephone Encounter (Signed)
 Please review labs and advise for medication dosing!

## 2023-10-27 ENCOUNTER — Encounter: Payer: Self-pay | Admitting: Internal Medicine

## 2023-10-27 MED ORDER — FIASP FLEXTOUCH 100 UNIT/ML ~~LOC~~ SOPN: PEN_INJECTOR | SUBCUTANEOUS | 1 refills | Status: DC

## 2023-10-28 ENCOUNTER — Ambulatory Visit: Payer: BC Managed Care – PPO | Admitting: Internal Medicine

## 2023-10-31 ENCOUNTER — Telehealth: Payer: Self-pay

## 2023-10-31 ENCOUNTER — Other Ambulatory Visit (HOSPITAL_COMMUNITY): Payer: Self-pay

## 2023-10-31 NOTE — Telephone Encounter (Signed)
 Pharmacy Patient Advocate Encounter   Received notification from Pt Calls Messages that prior authorization for Fiasp  is required/requested.   Insurance verification completed.   The patient is insured through [facility] .   Per test claim:  Humalog, Lyumjev , Novolog  is preferred by the insurance.  If suggested medication is appropriate, Please send in a new RX and discontinue this one. If not, please advise as to why it's not appropriate so that we may request a Prior Authorization. Please note, some preferred medications may still require a PA.  If the suggested medications have not been trialed and there are no contraindications to their use, the PA will not be submitted, as it will not be approved.

## 2023-10-31 NOTE — Telephone Encounter (Signed)
 Pt needs PA for Fiasp 

## 2023-11-01 NOTE — Telephone Encounter (Signed)
 J, Lets try to send Lyumjev  U100 at the same dose. Ty! C

## 2023-11-02 MED ORDER — LYUMJEV KWIKPEN 100 UNIT/ML ~~LOC~~ SOPN: PEN_INJECTOR | SUBCUTANEOUS | 3 refills | Status: DC

## 2023-11-02 NOTE — Addendum Note (Signed)
 Addended by: Vernon Goodpasture on: 11/02/2023 09:27 AM   Modules accepted: Orders

## 2023-11-02 NOTE — Addendum Note (Signed)
 Addended by: Vernon Goodpasture on: 11/02/2023 01:43 PM   Modules accepted: Orders

## 2023-11-17 ENCOUNTER — Telehealth (INDEPENDENT_AMBULATORY_CARE_PROVIDER_SITE_OTHER): Admitting: Family Medicine

## 2023-11-17 ENCOUNTER — Encounter: Payer: Self-pay | Admitting: Family Medicine

## 2023-11-17 DIAGNOSIS — J069 Acute upper respiratory infection, unspecified: Secondary | ICD-10-CM

## 2023-11-17 DIAGNOSIS — H1033 Unspecified acute conjunctivitis, bilateral: Secondary | ICD-10-CM

## 2023-11-17 MED ORDER — ERYTHROMYCIN 5 MG/GM OP OINT: 1.0000 | TOPICAL_OINTMENT | Freq: Two times a day (BID) | OPHTHALMIC | 1 refills | Status: AC

## 2023-11-17 NOTE — Progress Notes (Signed)
 MyChart Video Visit    Virtual Visit via Video Note    Patient location: Home. Patient and provider in visit Provider location: Office 2 patient identifiers used   I discussed the limitations of evaluation and management by telemedicine and the availability of in person appointments. The patient expressed understanding and agreed to proceed.  Visit Date: 11/17/2023  Today's healthcare provider: Alyson Back, NP-C     Subjective:    Patient ID: Luke West, male    DOB: 1955-09-05, 68 y.o.   MRN: [id number]  Chief Complaint  Patient presents with   Eye Problem    Eye Problem  Associated symptoms include an eye discharge and eye redness. Pertinent negatives include no blurred vision, double vision, fever, nausea, photophobia or vomiting.    C/o bilateral eye redness, burning, and purulent eye drainage. He has been sick with URI symptoms for approx 6 days.  No fever, chills, vision changes, foreign body sensation, eye pain with movement.   Taking Mucinex, Zyrtec and using Flonase.   [place] and [place] medication sent to pharmacy there.       Past Medical History:  Diagnosis Date   DM (diabetes mellitus) (HCC)    Familial hyperlipidemia 09/21/2007   Normal coronary arteries 02/2018 Strong family history of premature coronary artery disease   Oletta Berry syndrome 2008   Total bilirubin 1.4   Hyperlipemia    Raynaud phenomenon     Past Surgical History:  Procedure Laterality Date   COLONOSCOPY  2005 & 2010   negative X 2; Dr Lavaughn [place] (due 2020)   LEFT HEART CATH AND CORONARY ANGIOGRAPHY N/A 03/21/2018   Procedure: LEFT HEART CATH AND CORONARY ANGIOGRAPHY;  Surgeon: Swaziland, Peter M, MD;  Location: [facility] INVASIVE CV LAB;  Service: Cardiovascular;  Laterality: N/A;   SHOULDER SURGERY     post dislocation sequellae   TONSILLECTOMY AND ADENOIDECTOMY      Family History  Problem Relation Age of Onset   Aneurysm Mother        cns    Hyperlipidemia Mother    Lung cancer Father        smoker   Aneurysm Father        AAA   Heart attack Sister 33   Diabetes Brother        type 1   Lung cancer Paternal Uncle        smoker   Aneurysm Maternal Grandmother        cns;PRE 50 (mid 19s)   Diabetes Maternal Grandfather        type 1   Lung cancer Paternal Grandfather        smoker   Aneurysm Paternal Grandmother        AAA    Social History   Socioeconomic History   Marital status: Married    Spouse name: Not on file   Number of children: Not on file   Years of education: Not on file   Highest education level: Not on file  Occupational History   Not on file  Tobacco Use   Smoking status: Former    Current packs/day: 0.00    Types: Cigarettes    Quit date: 06/28/1990    Years since quitting: 33.4   Smokeless tobacco: Never   Tobacco comments:    WUJ:WJXBJ 3-4 year . Smoked [phone] , up to 1-2 ppd  Substance and Sexual Activity   Alcohol use: Yes    Alcohol/week: 14.0 standard  drinks of alcohol    Types: 14 Glasses of wine per week    Comment:  socially   Drug use: No   Sexual activity: Not on file  Other Topics Concern   Not on file  Social History Narrative   Not on file   Social Drivers of Health   Financial Resource Strain: Not on file  Food Insecurity: Not on file  Transportation Needs: Not on file  Physical Activity: Not on file  Stress: Not on file  Social Connections: Not on file  Intimate Partner Violence: Not on file    Outpatient Medications Prior to Visit  Medication Sig Dispense Refill   atorvastatin  (LIPITOR) 20 MG tablet Take 1 tablet (20 mg total) by mouth daily. 90 tablet 3   Continuous Glucose Sensor (DEXCOM G7 SENSOR) MISC Use to monitor glucose continuously DxCode:E10.21 9 each 3   dapagliflozin  propanediol (FARXIGA ) 10 MG TABS tablet Take 1 tablet (10 mg total) by mouth daily before breakfast. 90 tablet 3   insulin  glargine (LANTUS  SOLOSTAR) 100 UNIT/ML Solostar Pen  Inject 18 Units into the skin daily. 15 mL 1   insulin  glargine, 2 Unit Dial , (TOUJEO  MAX SOLOSTAR) 300 UNIT/ML Solostar Pen Inject 18 Units into the skin daily. 6 mL 3   Insulin  Lispro-aabc (LYUMJEV  KWIKPEN) 100 UNIT/ML KwikPen Inject under skin 3 to 6 units before meals, and 3 units before a snack (up to 25 units a day) 15 mL 3   Insulin  Pen Needle (BD PEN NEEDLE NANO 2ND GEN) 32G X 4 MM MISC USE 5 TIMES A DAY 500 each 3   Multiple Vitamin (MULTIVITAMIN) tablet Take 1 tablet by mouth daily.     REPATHA  SURECLICK 140 MG/ML SOAJ ADMINISTER 1 ML UNDER THE SKIN EVERY 14 DAYS 6 mL 3   Semaglutide , 1 MG/DOSE, (OZEMPIC , 1 MG/DOSE,) 4 MG/3ML SOPN INJECT 1MG  INTO THE SKIN ONCE A WEEK. 9 mL 3   No facility-administered medications prior to visit.    No Known Allergies  Review of Systems  Constitutional:  Negative for chills and fever.  HENT:  Positive for congestion.        Post nasal drainage  Eyes:  Positive for discharge and redness. Negative for blurred vision, double vision and photophobia.  Respiratory:  Positive for cough. Negative for shortness of breath.        Mild cough in the morning  Cardiovascular:  Negative for chest pain, palpitations and leg swelling.  Gastrointestinal:  Negative for nausea and vomiting.  Neurological:  Negative for dizziness, focal weakness and headaches.       Objective:     Physical Exam Constitutional:      General: He is not in acute distress.    Appearance: He is not ill-appearing.  Eyes:     Extraocular Movements: Extraocular movements intact.  Pulmonary:     Effort: Pulmonary effort is normal.  Skin:    General: Skin is dry.  Neurological:     Mental Status: He is alert and oriented to person, place, and time.  Psychiatric:        Mood and Affect: Mood normal.        Thought Content: Thought content normal.     There were no vitals taken for this visit. Wt Readings from Last 3 Encounters:  07/27/23 196 lb 3.2 oz (89 kg)  05/18/23 193  lb (87.5 kg)  04/27/23 196 lb 6.4 oz (89.1 kg)       Assessment & Plan:  Problem List Items Addressed This Visit   None Visit Diagnoses       Acute bacterial conjunctivitis of both eyes    -  Primary     Viral upper respiratory illness          His main concern is bilateral conjunctivitis.  Erythromycin ophthalmic ointment prescribed.  He will let me know if this is not affordable or if he is not improving over the next 2 to 3 days.  Advised him to use warm or cool compresses and good handwashing. 6-day history of URI symptoms.  He will continue Flonase, Zyrtec and Mucinex.  I am having Bevin Bucks. Scarfo start on erythromycin. I am also having him maintain his multivitamin, Ozempic  (1 MG/DOSE), Toujeo  Max SoloStar, Dexcom G7 Sensor, dapagliflozin  propanediol, atorvastatin , Repatha  SureClick, Lantus  SoloStar, BD Pen Needle Nano 2nd Gen, and Lyumjev  KwikPen.  Meds ordered this encounter  Medications   erythromycin ophthalmic ointment    Sig: Place 1 Application into both eyes in the morning and at bedtime.    Dispense:  3.5 g    Refill:  1    Supervising Provider:   Bambi Lever A [[id number]]    I discussed the assessment and treatment plan with the patient. The patient was provided an opportunity to ask questions and all were answered. The patient agreed with the plan and demonstrated an understanding of the instructions.   The patient was advised to call back or seek an in-person evaluation if the symptoms worsen or if the condition fails to improve as anticipated.   Alyson Back, NP-C [facility] at [place] [phone] (phone) [phone] (fax)  [facility]

## 2023-12-20 ENCOUNTER — Ambulatory Visit: Admitting: Internal Medicine

## 2023-12-20 ENCOUNTER — Encounter: Payer: Self-pay | Admitting: Internal Medicine

## 2023-12-20 VITALS — BP 122/70 | HR 65 | Ht 70.0 in | Wt 194.4 lb

## 2023-12-20 DIAGNOSIS — E1021 Type 1 diabetes mellitus with diabetic nephropathy: Secondary | ICD-10-CM

## 2023-12-20 DIAGNOSIS — E782 Mixed hyperlipidemia: Secondary | ICD-10-CM

## 2023-12-20 DIAGNOSIS — E663 Overweight: Secondary | ICD-10-CM

## 2023-12-20 LAB — POCT GLYCOSYLATED HEMOGLOBIN (HGB A1C): Hemoglobin A1C: 5.8 % — AB (ref 4.0–5.6)

## 2023-12-20 MED ORDER — LANTUS SOLOSTAR 100 UNIT/ML ~~LOC~~ SOPN: 14.0000 [IU] | PEN_INJECTOR | Freq: Every day | SUBCUTANEOUS | 3 refills | Status: AC

## 2023-12-20 NOTE — Patient Instructions (Addendum)
 Please continue: - Farxiga  10 mg in am - Ozempic  1 mg weekly - Lyumjev  3-6 units before meals, and 3 units before a snack and coffee (no insulin  before coffee if plan to exercise right after)  Try to decrease: - Lantus  14(-16) units daily at night  Please come back for a follow-up appointment in 6 months.

## 2023-12-20 NOTE — Progress Notes (Signed)
 Patient ID: Luke West, male   DOB: 06-12-1956, 68 y.o.   MRN: [id number]  HPI: Luke West is a 68 y.o.-year-old male, returning for follow-up for DM2, dx in 2003, but diagnosed as DM1 in 08/2017, insulin -dependent since 08/2017, uncontrolled, with complications (CKD, DR). Last visit was 6 months ago.  Interim history: No increased urination, blurry vision, nausea, chest pain. He just returned from [place], from a golf tournament.  While there, as he was eating out more, he increased his Toujeo  dose.  Reviewed HbA1c levels: Lab Results  Component Value Date   HGBA1C 6.1 05/18/2023   HGBA1C 5.8 (A) 10/26/2022   HGBA1C 6.0 (A) 04/27/2022  04/06/2016: HbA1c calculated from fructosamine: 6.08%  Pt is on a regimen of: - Jardiance  25 mg in a.m. >> Farxiga  10 mg in am - Ozempic  0.5 mg weekly - started 05/2019 >> 1 mg weekly -increased 09/2019 - Toujeo  14 >> 22 >> 18-20 >> 22 >> 20 >> 18 >> 16 >> 14 >> actually taking 16 units daily - Lyumjev  >> FiAsp  3-6 units before meals, 3 units before a snack, coffee, exercise (no insulin  before coffee if plan to exercise right after) >> will switch to Lyumjev  We stopped Onglyza and glipizide  in 05/2019 and switch to insulin  Previously on JanuMet  and Kombiglyze. He could not tolerate metformin  ER so we stopped in 02/2018.  He checks his sugars more than 4 times a day with his CGM:  Previously:  Previously:   Lowest sugar was 41 ... >> 50s >> 50s >> 50s; he has hypoglycemia awareness in the 70s. Highest sugar was 200s >> 300 >> 200s.  Glucometer: One Touch Ultra Mini  Pt's meals are: - Breakfast: banana or egg + cheese + sausage - Lunch: sandwich, soup, chilli - Dinner: meat, veggies, strach - Snacks: 1+ - nuts; crackers + PB, chips and salsa Drinks water, and sweet tea.  He was exercising 5 times a week at the gym but less since the coronavirus pandemic.  He  is now exercising consistently: In the morning and then walking his dog later  in the day  -+ Mild CKD, last BUN/creatinine:  Lab Results  Component Value Date   BUN 21 10/04/2023   CREATININE 1.26 10/04/2023   Lab Results  Component Value Date   GFRNONAA 72 12/12/2018   GFRNONAA 61 08/23/2018   GFRNONAA 71 03/20/2018   GFRNONAA 72.64 03/05/2010   GFRNONAA 67.46 09/11/2008   GFRNONAA 52 08/21/2007   GFRNONAA 75 08/22/2006   No MAU: Lab Results  Component Value Date   MICRALBCREAT 0.7 05/18/2023   MICRALBCREAT 0.6 05/13/2022   MICRALBCREAT 1.4 05/11/2021   MICRALBCREAT 0.7 05/01/2020   MICRALBCREAT 1.6 05/01/2019   MICRALBCREAT 0.9 08/13/2016   MICRALBCREAT 0.8 07/17/2014   MICRALBCREAT 0.7 08/10/2013   MICRALBCREAT 0.8 03/02/2012   MICRALBCREAT 1.7 10/27/2011   -+ HL; last set of lipids: Lab Results  Component Value Date   CHOL 110 10/04/2023   HDL 54 10/04/2023   LDLCALC 40 10/04/2023   LDLDIRECT 118.0 07/13/2017   TRIG 77 10/04/2023   CHOLHDL 2.0 10/04/2023  He restarted back on Lipitor in 04/2018.  He is currently on Lipitor 40 (dose decreased from 80 mg daily) and Repatha .  - last eye exam was:  09/08/2023: + DR  - no numbness and tingling in his feet.  Last foot exam 10/26/2022.  He had a clean cath in 02/2018.  He sees Dr. Raford. He also has a history of HTN.  He has Raynaud's phenomena.  ROS: + see HPI  I reviewed pt's medications, allergies, PMH, social hx, family hx, and changes were documented in the history of present illness. Otherwise, unchanged from my initial visit note.  Past Medical History:  Diagnosis Date   DM (diabetes mellitus) (HCC)    Familial hyperlipidemia 09/21/2007   Normal coronary arteries 02/2018 Strong family history of premature coronary artery disease   Bertrum syndrome 2008   Total bilirubin 1.4   Hyperlipemia    Raynaud phenomenon    Past Surgical History:  Procedure Laterality Date   COLONOSCOPY  2005 & 2010   negative X 2; Dr Rosalie (due 2020)   LEFT HEART CATH AND CORONARY ANGIOGRAPHY N/A  03/21/2018   Procedure: LEFT HEART CATH AND CORONARY ANGIOGRAPHY;  Surgeon: [place], Peter M, MD;  Location: [facility] INVAS[facility] CV LAB;  Service: Cardiovascular;  Laterality: N/A;   SHOULDER SURGERY     post dislocation sequellae   TONSILLECTOMY AND ADENOIDECTOMY     History   Social History   Marital Status: Married    Spouse Name: N/A   Number of Children: 3   Occupational History   Real estate development/construction   Social History Main Topics   Smoking status: Former Smoker    Quit date: 06/28/1990   Smokeless tobacco: Not on file     Comment: Wnt:rphjm 3-4 year . Smoked [phone] , up to 1-2 ppd   Alcohol Use: 8.4 oz/week    14 Glasses of wine per week     Comment:  socially   Drug Use: No   Current Outpatient Medications on File Prior to Visit  Medication Sig Dispense Refill   atorvastatin  (LIPITOR) 20 MG tablet Take 1 tablet (20 mg total) by mouth daily. 90 tablet 3   Continuous Glucose Sensor (DEXCOM G7 SENSOR) MISC Use to monitor glucose continuously DxCode:E10.21 9 each 3   dapagliflozin  propanediol (FARXIGA ) 10 MG TABS tablet Take 1 tablet (10 mg total) by mouth daily before breakfast. 90 tablet 3   erythromycin  ophthalmic ointment Place 1 Application into both eyes in the morning and at bedtime. 3.5 g 1   insulin  glargine (LANTUS  SOLOSTAR) 100 UNIT/ML Solostar Pen Inject 18 Units into the skin daily. 15 mL 1   insulin  glargine, 2 Unit Dial , (TOUJEO  MAX SOLOSTAR) 300 UNIT/ML Solostar Pen Inject 18 Units into the skin daily. 6 mL 3   Insulin  Lispro-aabc (LYUMJEV  KWIKPEN) 100 UNIT/ML KwikPen Inject under skin 3 to 6 units before meals, and 3 units before a snack (up to 25 units a day) 15 mL 3   Insulin  Pen Needle (BD PEN NEEDLE NANO 2ND GEN) 32G X 4 MM MISC USE 5 TIMES A DAY 500 each 3   Multiple Vitamin (MULTIVITAMIN) tablet Take 1 tablet by mouth daily.     REPATHA  SURECLICK 140 MG/ML SOAJ ADMINISTER 1 ML UNDER THE SKIN EVERY 14 DAYS 6 mL 3   Semaglutide , 1 MG/DOSE,  (OZEMPIC , 1 MG/DOSE,) 4 MG/3ML SOPN INJECT 1MG  INTO THE SKIN ONCE A WEEK. 9 mL 3   No current facility-administered medications on file prior to visit.   No Known Allergies Family History  Problem Relation Age of Onset   Aneurysm Mother        cns   Hyperlipidemia Mother    Lung cancer Father        smoker   Aneurysm Father        AAA   Heart attack Sister 37   Diabetes Brother  type 1   Lung cancer Paternal Uncle        smoker   Aneurysm Maternal Grandmother        cns;PRE 65 (mid 76s)   Diabetes Maternal Grandfather        type 1   Lung cancer Paternal Grandfather        smoker   Aneurysm Paternal Grandmother        AAA   PE: BP 122/70   Pulse 65   Ht 5' 10 (1.778 m)   Wt 194 lb 6.4 oz (88.2 kg)   SpO2 96%   BMI 27.89 kg/m   Wt Readings from Last 3 Encounters:  12/20/23 194 lb 6.4 oz (88.2 kg)  07/27/23 196 lb 3.2 oz (89 kg)  05/18/23 193 lb (87.5 kg)   Constitutional: overweight, in NAD Eyes:  EOMI, no exophthalmos ENT: no neck masses, no cervical lymphadenopathy Cardiovascular: RRR, No MRG Respiratory: CTA B Musculoskeletal: no deformities Skin:no rashes Neurological: no tremor with outstretched hands Diabetic Foot Exam - Simple   Simple Foot Form Diabetic Foot exam was performed with the following findings: Yes 12/20/2023  2:30 PM  Visual Inspection No deformities, no ulcerations, no other skin breakdown bilaterally: Yes Sensation Testing Intact to touch and monofilament testing bilaterally: Yes Pulse Check Posterior Tibialis and Dorsalis pulse intact bilaterally: Yes Comments    ASSESSMENT: 1. DM1, uncontrolled, with complications - CKD - DR  We diagnosed insulin  deficiency, consistent with a diagnosis of type 1 diabetes.  No detectable autoimmunity: Component     Latest Ref Rng & Units 09/02/2017  ZNT8 Antibodies     U/mL <15  Glutamic Acid Decarb Ab     <5 IU/mL <5  C-Peptide     0.80 - 3.85 ng/mL 0.51 (L)  Glucose, Plasma     65  - 99 mg/dL 858 (H)  Islet Cell Ab     Neg:<1:1 Negative   2. HL  3.  Overweight  PLAN:  1. Patient with long MEN, controlled type 1 diabetes, diagnosed as such in 2019.  Sugars initially improved after adding basal insulin  but we had to add rapid acting insulin  before meals afterwards.  He is also on SGLT2 inhibitor and GLP-1 receptor agonist.  HbA1c at last visit improved to 5.6%.  He had another HbA1c obtained 04/2023 and this was higher, at 6.1%, but still at goal. - At last visit, sugars were fluctuating within the target range with occasional hyperglycemic spikes but also some low blood sugars mostly in the late afternoon and scattered otherwise throughout the day and especially at night.  We discussed about reducing the Toujeo  dose but also the dose of Fiasp .  Since last visit we had to switch from Fiasp  to Lyumjev .  We discussed that these are interchangeable. -We previously discussed about checking blood sugars every time before driving and only start driving if they are higher than 100.  Also, for long drives, sugars should be checked every hour and correcting if lower than 100. CGM interpretation: -At today's visit, we reviewed his CGM downloads: It appears that 90% of values are in target range (goal >70%), while 8% are higher than 180 (goal <25%), and 2% are lower than 70 (goal <4%).  The calculated average blood sugar is 127.  The projected HbA1c for the next 3 months (GMI) is 6.4%. -Reviewing the CGM trends, sugars are still fluctuating within the target range, lower overnight and increasing slightly after every meal, but with only occasional hyperglycemic spikes.  He does have occasional low blood sugars without a clear pattern.  Upon questioning, he just returned from a golf tournament, in [place].  While there, he ate out more and sugars were slightly higher.  Therefore, he increased his Toujeo  dose to 18 units.  He was previously taking 16 units, which is higher than the recommended 14  units at the previous visit.  We discussed at today's visit that increasing the long-acting insulin  dose to cover hyperglycemic peaks after meals is not recommended due to the risk of hypoglycemia between meals and at night.  I recommended to decrease the dose of his long-acting insulin , especially as he is preparing to switch from Toujeo  to Lantus  per insurance preference and we discussed that Lantus  is slightly stronger than usual.  I did advise him to take Lantus  at night but if he starts dropping his blood sugars at night, we may need to move it to the morning.  He continues on Fiasp  but is preparing to switch to Lyumjev .  As of now, we will continue the same doses of ultra-rapid acting insulin  and the same doses of Ozempic  and Farxiga . - I advised him to: Patient Instructions  Please continue: - Farxiga  10 mg in am - Ozempic  1 mg weekly - Lyumjev  3-6 units before meals, and 3 units before a snack and coffee (no insulin  before coffee if plan to exercise right after)  Try to decrease: - Lantus  14(-16) units daily at night  Please come back for a follow-up appointment in 6 months.  - we checked his HbA1c: 5.8% (lower) - advised to check sugars at different times of the day - 4x a day, rotating check times - advised for yearly eye exams >> he is UTD - will check an ACR today - return to clinic in 6 months  2. HL  -Latest lipid panel from 09/2023 was at goal: Lab Results  Component Value Date   CHOL 110 10/04/2023   HDL 54 10/04/2023   LDLCALC 40 10/04/2023   LDLDIRECT 118.0 07/13/2017   TRIG 77 10/04/2023   CHOLHDL 2.0 10/04/2023  - He continues on Lipitor 40 mg daily and Repatha  without side effects  3.  Overweight -continue SGLT 2 inhibitor and GLP-1 receptor agonist which should also help with weight loss - He lost 4 pounds before last visit and lost 2 pounds since last visit  Lela Fendt, MD PhD [facility] Endocrinology

## 2023-12-23 ENCOUNTER — Ambulatory Visit: Payer: Self-pay | Admitting: Internal Medicine

## 2023-12-23 LAB — MICROALBUMIN / CREATININE URINE RATIO
Creatinine, Urine: 53 mg/dL (ref 20–320)
Microalb, Ur: <0.2 mg/dL

## 2023-12-26 NOTE — Addendum Note (Signed)
 Addended by: CLEOTILDE ROLIN RAMAN on: 12/26/2023 09:16 AM   Modules accepted: Orders

## 2024-02-11 ENCOUNTER — Other Ambulatory Visit: Payer: Self-pay | Admitting: Internal Medicine

## 2024-02-11 DIAGNOSIS — E119 Type 2 diabetes mellitus without complications: Secondary | ICD-10-CM

## 2024-03-27 DIAGNOSIS — L814 Other melanin hyperpigmentation: Secondary | ICD-10-CM | POA: Diagnosis not present

## 2024-03-27 DIAGNOSIS — L570 Actinic keratosis: Secondary | ICD-10-CM | POA: Diagnosis not present

## 2024-03-27 DIAGNOSIS — L821 Other seborrheic keratosis: Secondary | ICD-10-CM | POA: Diagnosis not present

## 2024-03-27 DIAGNOSIS — D225 Melanocytic nevi of trunk: Secondary | ICD-10-CM | POA: Diagnosis not present

## 2024-03-27 DIAGNOSIS — D485 Neoplasm of uncertain behavior of skin: Secondary | ICD-10-CM | POA: Diagnosis not present

## 2024-04-04 DIAGNOSIS — N401 Enlarged prostate with lower urinary tract symptoms: Secondary | ICD-10-CM | POA: Diagnosis not present

## 2024-04-11 DIAGNOSIS — Z125 Encounter for screening for malignant neoplasm of prostate: Secondary | ICD-10-CM | POA: Diagnosis not present

## 2024-04-11 DIAGNOSIS — N401 Enlarged prostate with lower urinary tract symptoms: Secondary | ICD-10-CM | POA: Diagnosis not present

## 2024-04-11 DIAGNOSIS — N5201 Erectile dysfunction due to arterial insufficiency: Secondary | ICD-10-CM | POA: Diagnosis not present

## 2024-04-11 DIAGNOSIS — R350 Frequency of micturition: Secondary | ICD-10-CM | POA: Diagnosis not present

## 2024-04-16 ENCOUNTER — Other Ambulatory Visit: Payer: Self-pay | Admitting: Internal Medicine

## 2024-04-16 DIAGNOSIS — E1021 Type 1 diabetes mellitus with diabetic nephropathy: Secondary | ICD-10-CM

## 2024-06-03 ENCOUNTER — Encounter: Payer: Self-pay | Admitting: Internal Medicine

## 2024-06-03 DIAGNOSIS — E1169 Type 2 diabetes mellitus with other specified complication: Secondary | ICD-10-CM | POA: Insufficient documentation

## 2024-06-03 NOTE — Progress Notes (Unsigned)
 Subjective:    Patient ID: Luke West, male    DOB: 05-Mar-1956, 68 y.o.   MRN: [id number]     HPI Sadiq is here for a physical exam and his chronic medical problems.   Overall doing well and has no concerns.   Medications and allergies reviewed with patient and updated if appropriate.  Current Outpatient Medications on File Prior to Visit  Medication Sig Dispense Refill   Continuous Glucose Sensor (DEXCOM G7 SENSOR) MISC USE TO MONITOR GLUCOSE CONTINUOUSLY, CHANGE EVERY 10 DAYS 9 each 3   dapagliflozin  propanediol (FARXIGA ) 10 MG TABS tablet Take 1 tablet (10 mg total) by mouth daily before breakfast. 90 tablet 3   erythromycin  ophthalmic ointment Place 1 Application into both eyes in the morning and at bedtime. 3.5 g 1   insulin  glargine (LANTUS  SOLOSTAR) 100 UNIT/ML Solostar Pen Inject 14-18 Units into the skin daily. 30 mL 3   Insulin  Lispro-aabc (LYUMJEV  KWIKPEN) 100 UNIT/ML KwikPen Inject under skin 3 to 6 units before meals, and 3 units before a snack (up to 25 units a day) 15 mL 3   Insulin  Pen Needle (BD PEN NEEDLE NANO 2ND GEN) 32G X 4 MM MISC USE 5 TIMES A DAY 500 each 3   Multiple Vitamin (MULTIVITAMIN) tablet Take 1 tablet by mouth daily.     Semaglutide , 1 MG/DOSE, (OZEMPIC , 1 MG/DOSE,) 4 MG/3ML SOPN INJECT 1 MG UNDER THE SKIN ONE DAY A WEEK 9 mL 1   No current facility-administered medications on file prior to visit.    Review of Systems  Constitutional:  Negative for fever.  Eyes:  Negative for visual disturbance.  Respiratory:  Negative for cough, shortness of breath and wheezing.   Cardiovascular:  Negative for chest pain, palpitations and leg swelling.  Gastrointestinal:  Negative for abdominal pain, blood in stool, constipation and diarrhea.       No gerd  Genitourinary:  Negative for difficulty urinating, dysuria and hematuria.  Musculoskeletal:  Negative for arthralgias and back pain.  Skin:  Negative for rash.  Neurological:  Negative for  dizziness, light-headedness and headaches.  Psychiatric/Behavioral:  Negative for dysphoric mood and sleep disturbance. The patient is not nervous/anxious.        Objective:   Vitals:   06/04/24 1255  BP: 126/80  Pulse: (!) 51  Temp: 98 F (36.7 C)  SpO2: 99%   Filed Weights   06/04/24 1255  Weight: 193 lb (87.5 kg)   Body mass index is 27.69 kg/m.  BP Readings from Last 3 Encounters:  06/04/24 126/80  12/20/23 122/70  07/27/23 128/76    Wt Readings from Last 3 Encounters:  06/04/24 193 lb (87.5 kg)  12/20/23 194 lb 6.4 oz (88.2 kg)  07/27/23 196 lb 3.2 oz (89 kg)      Physical Exam Constitutional: He appears well-developed and well-nourished. No distress.  HENT:  Head: Normocephalic and atraumatic.  Right Ear: External ear normal.  Left Ear: External ear normal.  Normal ear canals and TM b/l  Mouth/Throat: Oropharynx is clear and moist. Eyes: Conjunctivae and EOM are normal.  Neck: Neck supple. No tracheal deviation present. No thyromegaly present.  No carotid bruit  Cardiovascular: Normal rate, regular rhythm, normal heart sounds and intact distal pulses.   No murmur heard.  No lower extremity edema. Pulmonary/Chest: Effort normal and breath sounds normal. No respiratory distress. He has no wheezes. He has no rales.  Abdominal: Soft. He exhibits no distension. There is no tenderness.  Genitourinary: deferred  Lymphadenopathy:   He has no cervical adenopathy.  Skin: Skin is warm and dry. He is not diaphoretic.  Psychiatric: He has a normal mood and affect. His behavior is normal.         Assessment & Plan:   Physical exam: Screening blood work  ordered Exercise   5-6 days a week Weight  is good Substance abuse   none   Reviewed recommended immunizations.   Health Maintenance  Topic Date Due   Medicare Annual Wellness (AWV)  Never done   COVID-19 Vaccine (5 - 2025-26 season) 06/19/2024 (Originally 02/27/2024)   HEMOGLOBIN A1C  06/20/2024    OPHTHALMOLOGY EXAM  09/07/2024   Diabetic kidney evaluation - eGFR measurement  10/03/2024   Diabetic kidney evaluation - Urine ACR  12/19/2024   FOOT EXAM  12/19/2024   Pneumococcal Vaccine: 50+ Years (3 of 3 - PCV20 or PCV21) 03/27/2027   DTaP/Tdap/Td (3 - Td or Tdap) 04/30/2029   Colonoscopy  06/06/2029   Influenza Vaccine  Completed   Hepatitis C Screening  Completed   Zoster Vaccines- Shingrix  Completed   Meningococcal B Vaccine  Aged Out     See Problem List for Assessment and Plan of chronic medical problems.

## 2024-06-03 NOTE — Patient Instructions (Addendum)
 Blood work was ordered.       Medications changes include :   None    A referral was ordered and someone will call you to schedule an appointment.     Return in about 1 year (around 07/25/2024) for Physical Exam.   Health Maintenance, Male Adopting a healthy lifestyle and getting preventive care are important in promoting health and wellness. Ask your health care provider about: The right schedule for you to have regular tests and exams. Things you can do on your own to prevent diseases and keep yourself healthy. What should I know about diet, weight, and exercise? Eat a healthy diet  Eat a diet that includes plenty of vegetables, fruits, low-fat dairy products, and lean protein. Do not eat a lot of foods that are high in solid fats, added sugars, or sodium. Maintain a healthy weight Body mass index (BMI) is a measurement that can be used to identify possible weight problems. It estimates body fat based on height and weight. Your health care provider can help determine your BMI and help you achieve or maintain a healthy weight. Get regular exercise Get regular exercise. This is one of the most important things you can do for your health. Most adults should: Exercise for at least 150 minutes each week. The exercise should increase your heart rate and make you sweat (moderate-intensity exercise). Do strengthening exercises at least twice a week. This is in addition to the moderate-intensity exercise. Spend less time sitting. Even light physical activity can be beneficial. Watch cholesterol and blood lipids Have your blood tested for lipids and cholesterol at 68 years of age, then have this test every 5 years. You may need to have your cholesterol levels checked more often if: Your lipid or cholesterol levels are high. You are older than 68 years of age. You are at high risk for heart disease. What should I know about cancer screening? Many types of cancers can be detected  early and may often be prevented. Depending on your health history and family history, you may need to have cancer screening at various ages. This may include screening for: Colorectal cancer. Prostate cancer. Skin cancer. Lung cancer. What should I know about heart disease, diabetes, and high blood pressure? Blood pressure and heart disease High blood pressure causes heart disease and increases the risk of stroke. This is more likely to develop in people who have high blood pressure readings or are overweight. Talk with your health care provider about your target blood pressure readings. Have your blood pressure checked: Every 3-5 years if you are 65-11 years of age. Every year if you are 50 years old or older. If you are between the ages of 23 and 32 and are a current or former smoker, ask your health care provider if you should have a one-time screening for abdominal aortic aneurysm (AAA). Diabetes Have regular diabetes screenings. This checks your fasting blood sugar level. Have the screening done: Once every three years after age 34 if you are at a normal weight and have a low risk for diabetes. More often and at a younger age if you are overweight or have a high risk for diabetes. What should I know about preventing infection? Hepatitis B If you have a higher risk for hepatitis B, you should be screened for this virus. Talk with your health care provider to find out if you are at risk for hepatitis B infection. Hepatitis C Blood testing is recommended for:  Everyone born from [birth year] through 1965. Anyone with known risk factors for hepatitis C. Sexually transmitted infections (STIs) You should be screened each year for STIs, including gonorrhea and chlamydia, if: You are sexually active and are younger than 68 years of age. You are older than 68 years of age and your health care provider tells you that you are at risk for this type of infection. Your sexual activity has changed since  you were last screened, and you are at increased risk for chlamydia or gonorrhea. Ask your health care provider if you are at risk. Ask your health care provider about whether you are at high risk for HIV. Your health care provider may recommend a prescription medicine to help prevent HIV infection. If you choose to take medicine to prevent HIV, you should first get tested for HIV. You should then be tested every 3 months for as long as you are taking the medicine. Follow these instructions at home: Alcohol use Do not drink alcohol if your health care provider tells you not to drink. If you drink alcohol: Limit how much you have to 0-2 drinks a day. Know how much alcohol is in your drink. In the [place] one drink equals one 12 oz bottle of beer (355 mL), one 5 oz glass of wine (148 mL), or one 1 oz glass of hard liquor (44 mL). Lifestyle Do not use any products that contain nicotine or tobacco. These products include cigarettes, chewing tobacco, and vaping devices, such as e-cigarettes. If you need help quitting, ask your health care provider. Do not use street drugs. Do not share needles. Ask your health care provider for help if you need support or information about quitting drugs. General instructions Schedule regular health, dental, and eye exams. Stay current with your vaccines. Tell your health care provider if: You often feel depressed. You have ever been abused or do not feel safe at home. Summary Adopting a healthy lifestyle and getting preventive care are important in promoting health and wellness. Follow your health care provider's instructions about healthy diet, exercising, and getting tested or screened for diseases. Follow your health care provider's instructions on monitoring your cholesterol and blood pressure. This information is not intended to replace advice given to you by your health care provider. Make sure you discuss any questions you have with your health care  provider. Document Revised: 11/03/2020 Document Reviewed: 11/03/2020 Elsevier Patient Education  2024 [employer].

## 2024-06-04 ENCOUNTER — Ambulatory Visit: Payer: Self-pay | Admitting: Internal Medicine

## 2024-06-04 ENCOUNTER — Ambulatory Visit: Admitting: Internal Medicine

## 2024-06-04 VITALS — BP 126/80 | HR 51 | Temp 98.0°F | Ht 70.0 in | Wt 193.0 lb

## 2024-06-04 DIAGNOSIS — I700 Atherosclerosis of aorta: Secondary | ICD-10-CM

## 2024-06-04 DIAGNOSIS — N400 Enlarged prostate without lower urinary tract symptoms: Secondary | ICD-10-CM

## 2024-06-04 DIAGNOSIS — E1169 Type 2 diabetes mellitus with other specified complication: Secondary | ICD-10-CM

## 2024-06-04 DIAGNOSIS — Z0000 Encounter for general adult medical examination without abnormal findings: Secondary | ICD-10-CM

## 2024-06-04 LAB — CBC
HCT: 43.9 % (ref 39.0–52.0)
Hemoglobin: 14.8 g/dL (ref 13.0–17.0)
MCHC: 33.7 g/dL (ref 30.0–36.0)
MCV: 95.7 fl (ref 78.0–100.0)
Platelets: 174 K/uL (ref 150.0–400.0)
RBC: 4.59 Mil/uL (ref 4.22–5.81)
RDW: 12.4 % (ref 11.5–15.5)
WBC: 6.9 K/uL (ref 4.0–10.5)

## 2024-06-04 LAB — LIPID PANEL
Cholesterol: 110 mg/dL (ref 0–200)
HDL: 58.4 mg/dL (ref >39.00)
LDL Cholesterol: 38 mg/dL (ref 0–99)
NonHDL: 51.24
Total CHOL/HDL Ratio: 2
Triglycerides: 67 mg/dL (ref 0.0–149.0)
VLDL: 13.4 mg/dL (ref 0.0–40.0)

## 2024-06-04 LAB — COMPREHENSIVE METABOLIC PANEL WITH GFR
ALT: 14 U/L (ref 0–53)
AST: 19 U/L (ref 0–37)
Albumin: 4.5 g/dL (ref 3.5–5.2)
Alkaline Phosphatase: 75 U/L (ref 39–117)
BUN: 17 mg/dL (ref 6–23)
CO2: 31 meq/L (ref 19–32)
Calcium: 10.1 mg/dL (ref 8.4–10.5)
Chloride: 102 meq/L (ref 96–112)
Creatinine, Ser: 1.09 mg/dL (ref 0.40–1.50)
GFR: 69.91 mL/min (ref >60.00)
Glucose, Bld: 125 mg/dL — ABNORMAL HIGH (ref 70–99)
Potassium: 4.2 meq/L (ref 3.5–5.1)
Sodium: 142 meq/L (ref 135–145)
Total Bilirubin: 0.8 mg/dL (ref 0.2–1.2)
Total Protein: 7 g/dL (ref 6.0–8.3)

## 2024-06-04 LAB — TSH: TSH: 1.83 u[IU]/mL (ref 0.35–5.50)

## 2024-06-04 MED ORDER — ATORVASTATIN CALCIUM 20 MG PO TABS: 20.0000 mg | ORAL_TABLET | Freq: Every day | ORAL | 3 refills | Status: DC

## 2024-06-04 MED ORDER — REPATHA SURECLICK 140 MG/ML ~~LOC~~ SOAJ: SUBCUTANEOUS | 3 refills | Status: DC

## 2024-06-04 NOTE — Assessment & Plan Note (Signed)
 Chronic Following with urology

## 2024-06-04 NOTE — Assessment & Plan Note (Signed)
 Chronic Continue atorvastatin 40 mg daily, Repatha 140 mg every 14 days CMP, lipid panel, TSH, CBC

## 2024-06-04 NOTE — Assessment & Plan Note (Signed)
 Chronic Associated with hyperlipidemia Lab Results  Component Value Date   HGBA1C 5.8 (A) 12/20/2023   Sugars well-controlled Management per Dr. Trixie Eye exams up-to-date Continue regular exercise, diabetic diet

## 2024-06-04 NOTE — Assessment & Plan Note (Addendum)
 Chronic Associated with diabetes Check lipid panel, CMP, CBC, TSH Continue atorvastatin  20 mg daily, Repatha  from 40 mg q. 14 days Regular exercise and healthy diet encouraged

## 2024-06-11 ENCOUNTER — Encounter: Payer: Self-pay | Admitting: Internal Medicine

## 2024-06-11 ENCOUNTER — Ambulatory Visit: Admitting: Internal Medicine

## 2024-06-11 VITALS — BP 140/90 | Ht 70.0 in | Wt 194.0 lb

## 2024-06-11 DIAGNOSIS — E782 Mixed hyperlipidemia: Secondary | ICD-10-CM | POA: Diagnosis not present

## 2024-06-11 DIAGNOSIS — E663 Overweight: Secondary | ICD-10-CM

## 2024-06-11 DIAGNOSIS — E1021 Type 1 diabetes mellitus with diabetic nephropathy: Secondary | ICD-10-CM

## 2024-06-11 DIAGNOSIS — E119 Type 2 diabetes mellitus without complications: Secondary | ICD-10-CM

## 2024-06-11 LAB — POCT GLYCOSYLATED HEMOGLOBIN (HGB A1C): Hemoglobin A1C: 6.4 % — AB (ref 4.0–5.6)

## 2024-06-11 MED ORDER — GLUCAGON 3 MG/DOSE NA POWD: 3.0000 mg | Freq: Once | NASAL | 11 refills | Status: DC | PRN

## 2024-06-11 MED ORDER — DAPAGLIFLOZIN PROPANEDIOL 10 MG PO TABS: 10.0000 mg | ORAL_TABLET | Freq: Every day | ORAL | 3 refills | Status: AC

## 2024-06-11 MED ORDER — LYUMJEV KWIKPEN 100 UNIT/ML ~~LOC~~ SOPN: PEN_INJECTOR | SUBCUTANEOUS | 3 refills | Status: AC

## 2024-06-11 MED ORDER — OZEMPIC (1 MG/DOSE) 4 MG/3ML ~~LOC~~ SOPN: PEN_INJECTOR | SUBCUTANEOUS | 3 refills | Status: AC

## 2024-06-11 NOTE — Patient Instructions (Addendum)
 Please continue: - Farxiga  10 mg in am - Ozempic  1 mg weekly - Lyumjev  3-6 units before meals, and 3 units before a snack and coffee (no insulin  before coffee if plan to exercise right after) - Lantus  14 units daily at night  Please come back for a follow-up appointment in 6 months.

## 2024-06-11 NOTE — Progress Notes (Signed)
 Patient ID: Luke West, male   DOB: [birth year], 68 y.o.   MRN: [id number]  HPI: Luke West is a 68 y.o.-year-old male, returning for follow-up for DM2, dx in 2003, but diagnosed as DM1 in 08/2017, insulin -dependent since 08/2017, uncontrolled, with complications (CKD, DR). Last visit was 6 months ago.  Interim history: No increased urination, blurry vision, nausea, chest pain.  Reviewed HbA1c levels: Lab Results  Component Value Date   HGBA1C 5.8 (A) 12/20/2023   HGBA1C 6.1 05/18/2023   HGBA1C 5.8 (A) 10/26/2022  04/06/2016: HbA1c calculated from fructosamine: 6.08%  Pt is on a regimen of: - Jardiance  25 mg in a.m. >> Farxiga  10 mg in am - Ozempic  0.5 mg weekly - started 05/2019 >> 1 mg weekly -increased 09/2019 - Toujeo  14 >> 22 >> .SABRA. 14 >> actually taking 16 units daily >> Lantus  14-16 units daily - Lyumjev  >> FiAsp  3-6 units before meals, 3 units before a snack, coffee, exercise (no insulin  before coffee if plan to exercise right after) >> Lyumjev  We stopped Onglyza and glipizide  in 05/2019 and switch to insulin  Previously on JanuMet  and Kombiglyze. He could not tolerate metformin  ER so we stopped in 02/2018.  He checks his sugars more than 4 times a day with his CGM:  Previously:  Previously:   Lowest sugar was 41 ... >> 50s >> 50s; he has hypoglycemia awareness in the 70s. Highest sugar was 200s >> 300 >> 200s.  Glucometer: One Touch Ultra Mini  Pt's meals are: - Breakfast: banana or egg + cheese + sausage - Lunch: sandwich, soup, chilli - Dinner: meat, veggies, strach - Snacks: 1+ - nuts; crackers + PB, chips and salsa Drinks water, and sweet tea.  He was exercising 5 times a week at the gym but less since the coronavirus pandemic.  He  is now exercising consistently: In the morning and then walking his dog later in the day  -+ Mild CKD, last BUN/creatinine:  Lab Results  Component Value Date   BUN 17 06/04/2024   CREATININE 1.09 06/04/2024   Lab  Results  Component Value Date   GFRNONAA 72 12/12/2018   GFRNONAA 61 08/23/2018   GFRNONAA 71 03/20/2018   GFRNONAA 72.64 03/05/2010   GFRNONAA 67.46 09/11/2008   GFRNONAA 52 08/21/2007   GFRNONAA 75 08/22/2006   No MAU: Lab Results  Component Value Date   MICRALBCREAT NOTE 12/20/2023   MICRALBCREAT 0.9 03/05/2010   MICRALBCREAT 2.8 10/10/2008   MICRALBCREAT 29.8 03/21/2008   MICRALBCREAT 3.0 08/22/2006   -+ HL; last set of lipids: Lab Results  Component Value Date   CHOL 110 06/04/2024   HDL 58.40 06/04/2024   LDLCALC 38 06/04/2024   LDLDIRECT 118.0 07/13/2017   TRIG 67.0 06/04/2024   CHOLHDL 2 06/04/2024  He restarted back on Lipitor in 04/2018.  He is currently on Lipitor 40 (dose decreased from 80 mg daily) and Repatha .  - last eye exam was:  09/08/2023: + DR  - no numbness and tingling in his feet.  Last foot exam 12/20/2023.  He had a clean cath in 02/2018.  He sees Dr. Raford. He also has a history of HTN.  He has Raynaud's phenomena.  ROS: + see HPI  I reviewed pt's medications, allergies, PMH, social hx, family hx, and changes were documented in the history of present illness. Otherwise, unchanged from my initial visit note.  Past Medical History:  Diagnosis Date   DM (diabetes mellitus) (HCC)    Familial hyperlipidemia 09/21/2007  Normal coronary arteries 02/2018 Strong family history of premature coronary artery disease   Bertrum syndrome 2008   Total bilirubin 1.4   Hyperlipemia    Raynaud phenomenon    Past Surgical History:  Procedure Laterality Date   COLONOSCOPY  2005 & 2010   negative X 2; Dr Rosalie (due 2020)   LEFT HEART CATH AND CORONARY ANGIOGRAPHY N/A 03/21/2018   Procedure: LEFT HEART CATH AND CORONARY ANGIOGRAPHY;  Surgeon: Jordan, Peter M, MD;  Location: [facility];  Service: Cardiovascular;  Laterality: N/A;   SHOULDER SURGERY     post dislocation sequellae   TONSILLECTOMY AND ADENOIDECTOMY     History   Social History    Marital Status: Married    Spouse Name: N/A   Number of Children: 3   Occupational History   Real estate development/construction   Social History Main Topics   Smoking status: Former Smoker    Quit date: 06/28/1990   Smokeless tobacco: Not on file     Comment: Wnt:rphjm 3-4 year . Smoked [phone] , up to 1-2 ppd   Alcohol Use: 8.4 oz/week    14 Glasses of wine per week     Comment:  socially   Drug Use: No   Current Outpatient Medications on File Prior to Visit  Medication Sig Dispense Refill   atorvastatin  (LIPITOR) 20 MG tablet Take 1 tablet (20 mg total) by mouth daily. 90 tablet 3   Continuous Glucose Sensor (DEXCOM G7 SENSOR) MISC USE TO MONITOR GLUCOSE CONTINUOUSLY, CHANGE EVERY 10 DAYS 9 each 3   dapagliflozin  propanediol (FARXIGA ) 10 MG TABS tablet Take 1 tablet (10 mg total) by mouth daily before breakfast. 90 tablet 3   erythromycin  ophthalmic ointment Place 1 Application into both eyes in the morning and at bedtime. 3.5 g 1   Evolocumab  (REPATHA  SURECLICK) 140 MG/ML SOAJ ADMINISTER 1 ML UNDER THE SKIN EVERY 14 DAYS 6 mL 3   insulin  glargine (LANTUS  SOLOSTAR) 100 UNIT/ML Solostar Pen Inject 14-18 Units into the skin daily. 30 mL 3   Insulin  Lispro-aabc (LYUMJEV  KWIKPEN) 100 UNIT/ML KwikPen Inject under skin 3 to 6 units before meals, and 3 units before a snack (up to 25 units a day) 15 mL 3   Insulin  Pen Needle (BD PEN NEEDLE NANO 2ND GEN) 32G X 4 MM MISC USE 5 TIMES A DAY 500 each 3   Multiple Vitamin (MULTIVITAMIN) tablet Take 1 tablet by mouth daily.     Semaglutide , 1 MG/DOSE, (OZEMPIC , 1 MG/DOSE,) 4 MG/3ML SOPN INJECT 1 MG UNDER THE SKIN ONE DAY A WEEK 9 mL 1   No current facility-administered medications on file prior to visit.   No Known Allergies Family History  Problem Relation Age of Onset   Aneurysm Mother        cns   Hyperlipidemia Mother    Lung cancer Father        smoker   Aneurysm Father        AAA   Heart attack Sister 36   Diabetes Brother         type 1   Lung cancer Paternal Uncle        smoker   Aneurysm Maternal Grandmother        cns;PRE 44 (mid 51s)   Diabetes Maternal Grandfather        type 1   Lung cancer Paternal Grandfather        smoker   Aneurysm Paternal Grandmother  AAA   PE: BP (!) 140/90   Ht 5' 10 (1.778 m)   Wt 194 lb (88 kg)   BMI 27.84 kg/m   Wt Readings from Last 3 Encounters:  06/11/24 194 lb (88 kg)  06/04/24 193 lb (87.5 kg)  12/20/23 194 lb 6.4 oz (88.2 kg)   Constitutional: overweight, in NAD Eyes:  EOMI, no exophthalmos ENT: no neck masses, no cervical lymphadenopathy Cardiovascular: RRR, No MRG Respiratory: CTA B Musculoskeletal: no deformities Skin:no rashes Neurological: no tremor with outstretched hands  ASSESSMENT: 1. DM1, uncontrolled, with complications - CKD - DR  We diagnosed insulin  deficiency, consistent with a diagnosis of type 1 diabetes.  No detectable autoimmunity: Component     Latest Ref Rng & Units 09/02/2017  ZNT8 Antibodies     U/mL <15  Glutamic Acid Decarb Ab     <5 IU/mL <5  C-Peptide     0.80 - 3.85 ng/mL 0.51 (L)  Glucose, Plasma     65 - 99 mg/dL 858 (H)  Islet Cell Ab     Neg:<1:1 Negative   2. HL  3.  Overweight  PLAN:  1. Patient with Xilin dependent diabetes, diagnosed as type 1 in 2019.  Sugars initially improved after adding basal insulin  but we had to add rapid acting insulin  before meals afterwards.  He is also on SGLT2 inhibitor and GLP-1 receptor agonist.  HbA1c at last visit improved to 5.8%.  -At that time, sugars were fluctuating within the target range, lower overnight and increases slightly after every meal, but only with occasional hyperglycemic spikes.  He did have occasional low blood sugars without a clear pattern.  I advised him to decrease the long-acting insulin  dose.  He was preparing to switch from Toujeo  to Lantus  per insurance preference.  We discussed about starting Lantus  at night but if he had lower blood sugars  overnight to move it to the morning.  He was on Fiasp  but was preparing to switch to Lyumjev .  We did not change the doses of the rapid acting insulin , Farxiga , or Ozempic . -We previously discussed about checking blood sugars every time before driving and only start driving if they are higher than 100.  Also, for long drives, sugars should be checked every hour and correcting if lower than 100. CGM interpretation: -At today's visit, we reviewed his CGM downloads: It appears that 91% of values are in target range (goal >70%), while 8% are higher than 180 (goal <25%), and 1% are lower than 70 (goal <4%).  The calculated average blood sugar is 129.  The projected HbA1c for the next 3 months (GMI) is 6.4%. -Reviewing the CGM trends, sugars appear to be mostly well-controlled, fluctuating within the target range, with occasional mild hyperglycemic exceptions and occasional slightly low blood sugars which she attributes to hyperglycemia correction.  He did decrease the dose of Lantus  since last visit, as advised, after switching to this insulin  from Toujeo . However, the majority of the blood sugars are at goal despite the holidays, so for now we will continue the same regimen.  However, at today's visit we discussed about glucagon -formulations and also situations in which we will need it administered.  I sent a prescription for Baqsimi  to his pharmacy.  If this is not covered by his insurance, we can try G-Voke. - I advised him to: Patient Instructions  Please continue: - Farxiga  10 mg in am - Ozempic  1 mg weekly - Lyumjev  3-6 units before meals, and 3 units before a  snack and coffee (no insulin  before coffee if plan to exercise right after) - Lantus  14(-16) units daily at night  Please come back for a follow-up appointment in 6 months.  - we checked his HbA1c: 6.4% (slightly higher - advised to check sugars at different times of the day - 4x a day, rotating check times - advised for yearly eye exams >>  he is UTD - return to clinic in 6 months  2. HL  - Latest lipid panel from 05/2024 showed all fractions at goal: Lab Results  Component Value Date   CHOL 110 06/04/2024   HDL 58.40 06/04/2024   LDLCALC 38 06/04/2024   LDLDIRECT 118.0 07/13/2017   TRIG 67.0 06/04/2024   CHOLHDL 2 06/04/2024  - He continues on Lipitor 20 mg daily (dose decreased) and Repatha  without side effects  3.  Overweight - will continue the SGLT2 inhibitor and GLP-1 receptor agonist, which should also help with weight loss - Weight is stable since last visit  Lela Fendt, MD PhD [facility] Endocrinology

## 2024-06-18 ENCOUNTER — Other Ambulatory Visit (HOSPITAL_COMMUNITY): Payer: Self-pay

## 2024-06-18 ENCOUNTER — Encounter: Payer: Self-pay | Admitting: Internal Medicine

## 2024-06-18 ENCOUNTER — Telehealth: Payer: Self-pay

## 2024-06-18 NOTE — Telephone Encounter (Signed)
 Pharmacy Patient Advocate Encounter   Received notification from Patient Advice Request messages that prior authorization for Baqsimi  is required/requested.   Insurance verification completed.   The patient is insured through [facility]   Per test claim: The current 8 day co-pay is, $0.  No PA needed at this time. This test claim was processed through [facility]- copay amounts may vary at other pharmacies due to pharmacy/plan contracts, or as the patient moves through the different stages of their insurance plan.     It looks like the script is written as a 1 day supply. Insurance will only cover it as a 1 per 8 day fill.

## 2024-06-29 MED ORDER — BAQSIMI ONE PACK 3 MG/DOSE NA POWD: NASAL | 12 refills | Status: AC

## 2024-06-29 NOTE — Addendum Note (Signed)
 Addended by: CLEOTILDE ROLIN RAMAN on: 06/29/2024 10:46 AM   Modules accepted: Orders

## 2024-07-01 ENCOUNTER — Other Ambulatory Visit (HOSPITAL_BASED_OUTPATIENT_CLINIC_OR_DEPARTMENT_OTHER): Payer: Self-pay | Admitting: Cardiovascular Disease

## 2024-07-19 LAB — HM COLONOSCOPY

## 2024-07-26 ENCOUNTER — Encounter: Payer: Self-pay | Admitting: Internal Medicine

## 2024-07-26 DIAGNOSIS — Z860100 Personal history of colon polyps, unspecified: Secondary | ICD-10-CM | POA: Insufficient documentation

## 2024-07-31 ENCOUNTER — Other Ambulatory Visit (HOSPITAL_BASED_OUTPATIENT_CLINIC_OR_DEPARTMENT_OTHER): Payer: Self-pay | Admitting: Cardiovascular Disease

## 2024-12-11 ENCOUNTER — Ambulatory Visit: Admitting: Internal Medicine
